# Patient Record
Sex: Male | Born: 1960 | Race: Black or African American | Hispanic: No | Marital: Single | Smoking: Current every day smoker
Health system: Southern US, Community
[De-identification: ages and names within clinical notes are randomized; demographics above are authoritative.]

## PROBLEM LIST (undated history)

## (undated) DIAGNOSIS — I10 Essential (primary) hypertension: Secondary | ICD-10-CM

## (undated) DIAGNOSIS — I255 Ischemic cardiomyopathy: Secondary | ICD-10-CM

## (undated) DIAGNOSIS — I2510 Atherosclerotic heart disease of native coronary artery without angina pectoris: Secondary | ICD-10-CM

## (undated) HISTORY — PX: TOTAL HIP ARTHROPLASTY: SHX124

---

## 2003-02-28 ENCOUNTER — Emergency Department (HOSPITAL_COMMUNITY): Admission: EM | Admit: 2003-02-28 | Discharge: 2003-02-28 | Payer: Self-pay | Admitting: *Deleted

## 2004-10-27 ENCOUNTER — Emergency Department (HOSPITAL_COMMUNITY): Admission: EM | Admit: 2004-10-27 | Discharge: 2004-10-27 | Payer: Self-pay | Admitting: Emergency Medicine

## 2005-01-14 ENCOUNTER — Emergency Department (HOSPITAL_COMMUNITY): Admission: EM | Admit: 2005-01-14 | Discharge: 2005-01-14 | Payer: Self-pay | Admitting: *Deleted

## 2005-12-10 ENCOUNTER — Emergency Department (HOSPITAL_COMMUNITY): Admission: EM | Admit: 2005-12-10 | Discharge: 2005-12-10 | Payer: Self-pay | Admitting: Emergency Medicine

## 2006-01-09 ENCOUNTER — Emergency Department (HOSPITAL_COMMUNITY): Admission: EM | Admit: 2006-01-09 | Discharge: 2006-01-10 | Payer: Self-pay | Admitting: Emergency Medicine

## 2006-02-09 ENCOUNTER — Emergency Department (HOSPITAL_COMMUNITY): Admission: EM | Admit: 2006-02-09 | Discharge: 2006-02-09 | Payer: Self-pay | Admitting: Emergency Medicine

## 2006-02-10 ENCOUNTER — Ambulatory Visit: Payer: Self-pay | Admitting: Family Medicine

## 2006-03-03 ENCOUNTER — Ambulatory Visit: Payer: Self-pay | Admitting: Family Medicine

## 2006-03-10 ENCOUNTER — Ambulatory Visit: Payer: Self-pay | Admitting: Family Medicine

## 2006-03-13 ENCOUNTER — Ambulatory Visit: Payer: Self-pay | Admitting: Family Medicine

## 2008-12-25 ENCOUNTER — Encounter (HOSPITAL_COMMUNITY): Admission: RE | Admit: 2008-12-25 | Discharge: 2009-01-24 | Payer: Self-pay | Admitting: Orthopedic Surgery

## 2009-01-26 ENCOUNTER — Encounter (HOSPITAL_COMMUNITY): Admission: RE | Admit: 2009-01-26 | Discharge: 2009-02-22 | Payer: Self-pay | Admitting: Orthopedic Surgery

## 2010-12-08 ENCOUNTER — Encounter: Payer: Self-pay | Admitting: Orthopaedic Surgery

## 2011-09-02 ENCOUNTER — Emergency Department (HOSPITAL_COMMUNITY)
Admission: EM | Admit: 2011-09-02 | Discharge: 2011-09-02 | Disposition: A | Discharge status: Home / Self Care | Payer: Medicare Other | Attending: Emergency Medicine | Admitting: Emergency Medicine

## 2011-09-02 ENCOUNTER — Encounter: Payer: Self-pay | Admitting: *Deleted

## 2011-09-02 DIAGNOSIS — Z96649 Presence of unspecified artificial hip joint: Secondary | ICD-10-CM | POA: Insufficient documentation

## 2011-09-02 DIAGNOSIS — R51 Headache: Secondary | ICD-10-CM | POA: Insufficient documentation

## 2011-09-02 DIAGNOSIS — F17200 Nicotine dependence, unspecified, uncomplicated: Secondary | ICD-10-CM | POA: Insufficient documentation

## 2011-09-02 DIAGNOSIS — M62838 Other muscle spasm: Secondary | ICD-10-CM | POA: Insufficient documentation

## 2011-09-02 DIAGNOSIS — M436 Torticollis: Secondary | ICD-10-CM | POA: Insufficient documentation

## 2011-09-02 MED ORDER — ONDANSETRON HCL 4 MG PO TABS
4.0000 mg | ORAL_TABLET | Freq: Once | ORAL | Status: AC
  Administered 2011-09-02: 4 mg via ORAL
  Filled 2011-09-02: qty 1

## 2011-09-02 MED ORDER — DIAZEPAM 5 MG PO TABS
5.0000 mg | ORAL_TABLET | Freq: Once | ORAL | Status: AC
  Administered 2011-09-02: 5 mg via ORAL
  Filled 2011-09-02: qty 1

## 2011-09-02 MED ORDER — MORPHINE SULFATE 10 MG/ML IJ SOLN
8.0000 mg | Freq: Once | INTRAMUSCULAR | Status: AC
  Administered 2011-09-02: 10 mg via INTRAVENOUS
  Filled 2011-09-02: qty 1

## 2011-09-02 MED ORDER — HYDROCODONE-ACETAMINOPHEN 7.5-325 MG PO TABS: 1.0000 | ORAL_TABLET | ORAL | Status: AC | PRN

## 2011-09-02 MED ORDER — METHOCARBAMOL 500 MG PO TABS: 500.0000 mg | ORAL_TABLET | Freq: Two times a day (BID) | ORAL | Status: AC

## 2011-09-02 NOTE — ED Notes (Signed)
Pt states he "probally slept wrong  Sunday night," when he woke Monday morning neck was sore and pain has progressed over past 2 days. Rt side of neck is tight with lymph palpable just under ear.  Pt states jolts of pain are felt when he walks and swallows.

## 2011-09-02 NOTE — ED Notes (Signed)
Pt c/o pain in his neck since waking up yesterday morning. Pt denies injury.

## 2011-09-02 NOTE — ED Provider Notes (Signed)
History     CSN: [id number] Arrival date & time: 09/02/2011 12:36 PM   First MD Initiated Contact with Patient 09/02/11 1403      Chief Complaint  Patient presents with  . Neck Pain    (Consider location/radiation/quality/duration/timing/severity/associated sxs/prior treatment) Patient is a 51 y.o. male presenting with neck pain. The history is provided by the patient.  Neck Pain  The current episode started yesterday. The problem occurs constantly. The problem has been gradually worsening. The pain is associated with an unknown factor. There has been no fever. The pain is present in the right side. The pain is severe. The symptoms are aggravated by position. The pain is the same all the time. Associated symptoms include headaches. Pertinent negatives include no photophobia, no chest pain, no paresis and no tingling. He has tried analgesics for the symptoms.    History reviewed. No pertinent past medical history.  Past Surgical History  Procedure Date  . Total hip arthroplasty     bilateral    History reviewed. No pertinent family history.  History  Substance Use Topics  . Smoking status: Current Everyday Smoker -- 1.0 packs/day  . Smokeless tobacco: Not on file  . Alcohol Use: Yes     weekly      Review of Systems  Constitutional: Negative for activity change.       All ROS Neg except as noted in HPI  HENT: Positive for neck pain. Negative for nosebleeds.   Eyes: Negative for photophobia and discharge.  Respiratory: Negative for cough, shortness of breath and wheezing.   Cardiovascular: Negative for chest pain and palpitations.  Gastrointestinal: Negative for abdominal pain and blood in stool.  Genitourinary: Negative for dysuria, frequency and hematuria.  Musculoskeletal: Negative for back pain and arthralgias.  Skin: Negative.   Neurological: Positive for headaches. Negative for dizziness, tingling, seizures and speech difficulty.  Psychiatric/Behavioral:  Negative for hallucinations and confusion.    Allergies  Ibuprofen  Home Medications  No current outpatient prescriptions on file.  BP 134/82  Pulse 80  Temp(Src) 98.5 F (36.9 C) (Oral)  Resp 18  Ht 5\' 9"  (1.753 m)  Wt 154 lb (69.854 kg)  BMI 22.74 kg/m2  SpO2 97%  Physical Exam  Nursing note and vitals reviewed. Constitutional: He is oriented to person, place, and time. He appears well-developed and well-nourished.  Non-toxic appearance.  HENT:  Head: Normocephalic.  Right Ear: Tympanic membrane and external ear normal.  Left Ear: Tympanic membrane and external ear normal.  Eyes: EOM and lids are normal. Pupils are equal, round, and reactive to light.  Neck: Carotid bruit is not present.       Pain and muscle spasm of the right neck. No bruit. No CLA.  Cardiovascular: Normal rate, regular rhythm, normal heart sounds, intact distal pulses and normal pulses.   Pulmonary/Chest: Breath sounds normal. No respiratory distress.  Abdominal: Soft. Bowel sounds are normal. There is no tenderness. There is no guarding.  Musculoskeletal: Normal range of motion.  Lymphadenopathy:       Head (right side): No submandibular adenopathy present.       Head (left side): No submandibular adenopathy present.    He has no cervical adenopathy.  Neurological: He is alert and oriented to person, place, and time. He has normal strength. No cranial nerve deficit or sensory deficit.  Skin: Skin is warm and dry.  Psychiatric: He has a normal mood and affect. His speech is normal.    ED Course  Procedures (  including critical care time)  Labs Reviewed - No data to display No results found.  Dx: [place]   MDM  I have reviewed nursing notes, vital signs, and all appropriate lab and imaging results for this patient.        Kathie Dike, [place] 09/09/11 [phone]

## 2011-09-11 NOTE — ED Provider Notes (Signed)
Medical screening examination/treatment/procedure(s) were performed by non-physician practitioner and as supervising physician I was immediately available for consultation/collaboration.   Saraiah Bhat M Terrell Shimko, DO 09/11/11 1713 

## 2012-12-29 ENCOUNTER — Encounter (HOSPITAL_COMMUNITY): Payer: Self-pay | Admitting: *Deleted

## 2012-12-29 ENCOUNTER — Emergency Department (HOSPITAL_COMMUNITY)
Admission: EM | Admit: 2012-12-29 | Discharge: 2012-12-29 | Disposition: A | Discharge status: Home / Self Care | Payer: Medicare Other | Attending: Emergency Medicine | Admitting: Emergency Medicine

## 2012-12-29 DIAGNOSIS — T7840XA Allergy, unspecified, initial encounter: Secondary | ICD-10-CM

## 2012-12-29 DIAGNOSIS — T4995XA Adverse effect of unspecified topical agent, initial encounter: Secondary | ICD-10-CM | POA: Insufficient documentation

## 2012-12-29 DIAGNOSIS — L299 Pruritus, unspecified: Secondary | ICD-10-CM | POA: Insufficient documentation

## 2012-12-29 DIAGNOSIS — F17200 Nicotine dependence, unspecified, uncomplicated: Secondary | ICD-10-CM | POA: Insufficient documentation

## 2012-12-29 MED ORDER — DIPHENHYDRAMINE HCL 25 MG PO CAPS
50.0000 mg | ORAL_CAPSULE | Freq: Once | ORAL | Status: AC
  Administered 2012-12-29: 50 mg via ORAL
  Filled 2012-12-29: qty 2

## 2012-12-29 MED ORDER — PREDNISONE 50 MG PO TABS
60.0000 mg | ORAL_TABLET | Freq: Once | ORAL | Status: AC
  Administered 2012-12-29: 60 mg via ORAL
  Filled 2012-12-29: qty 1

## 2012-12-29 MED ORDER — PREDNISONE 10 MG PO TABS: 20.0000 mg | ORAL_TABLET | Freq: Every day | ORAL | Status: DC

## 2012-12-29 MED ORDER — FAMOTIDINE 20 MG PO TABS
20.0000 mg | ORAL_TABLET | Freq: Once | ORAL | Status: AC
  Administered 2012-12-29: 20 mg via ORAL
  Filled 2012-12-29: qty 1

## 2012-12-29 NOTE — ED Provider Notes (Signed)
History     CSN: [id number]  Arrival date & time 12/29/12  5621   First MD Initiated Contact with Patient 12/29/12 [phone]      Chief Complaint  Patient presents with  . Allergic Reaction    face feels tight. no new meds    (Consider location/radiation/quality/duration/timing/severity/associated sxs/prior treatment) HPI Tyler James is a 52 y.o. male who presents to the Emergency Department complaining of waking up with itching and a sense of fullness to his face. He noticed when looking in the mirror that he has a rash on his face. 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 He denies shortness of breath, fever, chills, cough.  History reviewed. No pertinent past medical history.  Past Surgical History  Procedure Laterality Date  . Total hip arthroplasty      bilateral    History reviewed. No pertinent family history.  History  Substance Use Topics  . Smoking status: Current Every Day Smoker -- 1.00 packs/day  . Smokeless tobacco: Not on file  . Alcohol Use: Yes     Comment: weekly      Review of Systems  Constitutional: Negative for fever.       10 Systems reviewed and are negative for acute change except as noted in the HPI.  HENT: Negative for congestion.   Eyes: Negative for discharge and redness.  Respiratory: Negative for cough and shortness of breath.   Cardiovascular: Negative for chest pain.  Gastrointestinal: Negative for vomiting and abdominal pain.  Musculoskeletal: Negative for back pain.  Skin: Negative for rash.       Fine rash to face, neck, hands  Neurological: Negative for syncope, numbness and headaches.  Psychiatric/Behavioral:       No behavior change.    Allergies  Ibuprofen  Home Medications   Current Outpatient Rx  Name  Route  Sig  Dispense  Refill  . acetaminophen (TYLENOL) 500 MG tablet   Oral   Take 1,000 mg by mouth as needed. For pain            BP 128/79  Pulse 83  Temp(Src) 97.9 F (36.6 C)  (Oral)  Ht 6\' 1"  (1.854 m)  Wt 160 lb (72.576 kg)  BMI 21.11 kg/m2  SpO2 95%  Physical Exam  Nursing note and vitals reviewed. Constitutional:  Awake, alert, nontoxic appearance.  HENT:  Head: Atraumatic.  Eyes: Right eye exhibits no discharge. Left eye exhibits no discharge.  Neck: Neck supple.  Cardiovascular: Normal rate.   Pulmonary/Chest: Effort normal and breath sounds normal. He exhibits no tenderness.  Abdominal: Soft. There is no tenderness. There is no rebound.  Musculoskeletal: He exhibits no tenderness.  Baseline ROM, no obvious new focal weakness.  Neurological:  Mental status and motor strength appears baseline for patient and situation.  Skin: No rash noted.  Fine rash to face, hands, feet, groin, axilla  Psychiatric: He has a normal mood and affect.    ED Course  Procedures (including critical care time)    MDM  Patient with a non specific rash that itches. Given benadryl, prednisone, pepcid. Patient feels better. Pt stable in ED with no significant deterioration in condition.The patient appears reasonably screened and/or stabilized for discharge and I doubt any other medical condition or other [facility] requiring further screening, evaluation, or treatment in the ED at this time prior to discharge. MDM Reviewed: nursing note and vitals           Nicoletta Dress.  Colon Branch, MD 12/29/12 [phone]

## 2013-09-29 DIAGNOSIS — M1990 Unspecified osteoarthritis, unspecified site: Secondary | ICD-10-CM | POA: Diagnosis not present

## 2013-10-03 ENCOUNTER — Other Ambulatory Visit (HOSPITAL_COMMUNITY): Payer: Self-pay | Admitting: Internal Medicine

## 2013-10-03 ENCOUNTER — Ambulatory Visit (HOSPITAL_COMMUNITY)
Admission: RE | Admit: 2013-10-03 | Discharge: 2013-10-03 | Disposition: A | Discharge status: Home / Self Care | Payer: Medicare Other | Source: Ambulatory Visit | Attending: Internal Medicine | Admitting: Internal Medicine

## 2013-10-03 DIAGNOSIS — M25529 Pain in unspecified elbow: Secondary | ICD-10-CM | POA: Insufficient documentation

## 2013-10-03 DIAGNOSIS — M25429 Effusion, unspecified elbow: Secondary | ICD-10-CM | POA: Insufficient documentation

## 2013-10-03 DIAGNOSIS — R609 Edema, unspecified: Secondary | ICD-10-CM

## 2013-10-03 DIAGNOSIS — M7989 Other specified soft tissue disorders: Secondary | ICD-10-CM | POA: Diagnosis not present

## 2013-10-06 DIAGNOSIS — F17200 Nicotine dependence, unspecified, uncomplicated: Secondary | ICD-10-CM | POA: Diagnosis not present

## 2013-10-06 DIAGNOSIS — E7800 Pure hypercholesterolemia, unspecified: Secondary | ICD-10-CM | POA: Diagnosis not present

## 2013-10-06 DIAGNOSIS — I10 Essential (primary) hypertension: Secondary | ICD-10-CM | POA: Diagnosis not present

## 2013-10-19 ENCOUNTER — Emergency Department (HOSPITAL_COMMUNITY)
Admission: EM | Admit: 2013-10-19 | Discharge: 2013-10-19 | Disposition: A | Discharge status: Home / Self Care | Payer: Medicare Other | Attending: Emergency Medicine | Admitting: Emergency Medicine

## 2013-10-19 ENCOUNTER — Encounter (HOSPITAL_COMMUNITY): Payer: Self-pay | Admitting: Emergency Medicine

## 2013-10-19 DIAGNOSIS — K089 Disorder of teeth and supporting structures, unspecified: Secondary | ICD-10-CM | POA: Diagnosis not present

## 2013-10-19 DIAGNOSIS — F17200 Nicotine dependence, unspecified, uncomplicated: Secondary | ICD-10-CM | POA: Diagnosis not present

## 2013-10-19 DIAGNOSIS — IMO0002 Reserved for concepts with insufficient information to code with codable children: Secondary | ICD-10-CM | POA: Diagnosis not present

## 2013-10-19 DIAGNOSIS — K0889 Other specified disorders of teeth and supporting structures: Secondary | ICD-10-CM

## 2013-10-19 MED ORDER — PENICILLIN V POTASSIUM 500 MG PO TABS: 500.0000 mg | ORAL_TABLET | Freq: Four times a day (QID) | ORAL | Status: DC

## 2013-10-19 NOTE — ED Notes (Signed)
Pt c/o R lower jaw dental pain. 

## 2013-10-19 NOTE — ED Provider Notes (Signed)
CSN: [id number]     Arrival date & time 10/19/13  1441 History   First MD Initiated Contact with Patient 10/19/13 1446     Chief Complaint  Patient presents with  . Dental Pain   (Consider location/radiation/quality/duration/timing/severity/associated sxs/prior Treatment) HPI Comments: Patient presents to the emergency department with a dental complaint. Symptoms began 3-4 days ago. The patient has tried to alleviate pain with BC powder and tylenol.  Pain rated at a 10/10, characterized as throbbing in nature and located right, rear, lower molar. Patient denies fever, night sweats, chills, difficulty swallowing or opening mouth, SOB, nuchal rigidity or decreased ROM of neck.  Patient does have a dentist appointment scheduled for in 2 weeks.   The history is provided by the patient. No language interpreter was used.    History reviewed. No pertinent past medical history. Past Surgical History  Procedure Laterality Date  . Total hip arthroplasty      bilateral   No family history on file. History  Substance Use Topics  . Smoking status: Current Every Day Smoker -- 1.00 packs/day  . Smokeless tobacco: Not on file  . Alcohol Use: Yes     Comment: weekly    Review of Systems  All other systems reviewed and are negative.    Allergies  Ibuprofen  Home Medications   Current Outpatient Rx  Name  Route  Sig  Dispense  Refill  . acetaminophen (TYLENOL) 500 MG tablet   Oral   Take 1,000 mg by mouth as needed. For pain          . penicillin v potassium (VEETID) 500 MG tablet   Oral   Take 1 tablet (500 mg total) by mouth 4 (four) times daily.   40 tablet   0   . predniSONE (DELTASONE) 10 MG tablet   Oral   Take 2 tablets (20 mg total) by mouth daily.   10 tablet   0    BP 147/98  Pulse 83  Temp(Src) 98.4 F (36.9 C) (Oral)  Resp 16  Ht 5\' 9"  (1.753 m)  Wt 165 lb (74.844 kg)  BMI 24.36 kg/m2  SpO2 100% Physical Exam  Nursing note and vitals  reviewed. Constitutional: He is oriented to person, place, and time. He appears well-developed and well-nourished.  HENT:  Head: Normocephalic and atraumatic.  Mouth/Throat:    Poor dentition throughout.  Affected tooth as diagrammed.  No signs of peritonsillar or tonsillar abscess.  No signs of gingival abscess. Oropharynx is clear and without exudates.  Uvula is midline.  Airway is intact. No signs of Ludwig's angina with palpation of oral and sublingual mucosa.   Eyes: Conjunctivae and EOM are normal.  Neck: Normal range of motion.  Cardiovascular: Normal rate.   Pulmonary/Chest: Effort normal.  Abdominal: He exhibits no distension.  Musculoskeletal: Normal range of motion.  Neurological: He is alert and oriented to person, place, and time.  Skin: Skin is dry.  Psychiatric: He has a normal mood and affect. His behavior is normal. Judgment and thought content normal.    ED Course  Procedures (including critical care time) Labs Review Labs Reviewed - No data to display Imaging Review No results found.  EKG Interpretation   None       MDM   1. Pain, dental    Patient with toothache.  No gross abscess.  Exam unconcerning for Ludwig's angina or spread of infection.  Will treat with penicillin, continue OTC tylenol, ice, and heat.  Urged patient  to follow-up with dentist.       Roxy Horseman, PA-C 10/19/13 1459

## 2013-10-20 ENCOUNTER — Encounter (HOSPITAL_COMMUNITY): Payer: Self-pay | Admitting: Emergency Medicine

## 2013-10-20 ENCOUNTER — Emergency Department (HOSPITAL_COMMUNITY)
Admission: EM | Admit: 2013-10-20 | Discharge: 2013-10-20 | Disposition: A | Discharge status: Home / Self Care | Payer: Medicare Other | Attending: Emergency Medicine | Admitting: Emergency Medicine

## 2013-10-20 DIAGNOSIS — K08109 Complete loss of teeth, unspecified cause, unspecified class: Secondary | ICD-10-CM | POA: Insufficient documentation

## 2013-10-20 DIAGNOSIS — K029 Dental caries, unspecified: Secondary | ICD-10-CM | POA: Diagnosis not present

## 2013-10-20 DIAGNOSIS — K089 Disorder of teeth and supporting structures, unspecified: Secondary | ICD-10-CM | POA: Insufficient documentation

## 2013-10-20 DIAGNOSIS — IMO0002 Reserved for concepts with insufficient information to code with codable children: Secondary | ICD-10-CM | POA: Insufficient documentation

## 2013-10-20 DIAGNOSIS — F17200 Nicotine dependence, unspecified, uncomplicated: Secondary | ICD-10-CM | POA: Insufficient documentation

## 2013-10-20 DIAGNOSIS — K0889 Other specified disorders of teeth and supporting structures: Secondary | ICD-10-CM

## 2013-10-20 MED ORDER — TRAMADOL HCL 50 MG PO TABS
100.0000 mg | ORAL_TABLET | Freq: Once | ORAL | Status: AC
  Administered 2013-10-20: 100 mg via ORAL
  Filled 2013-10-20: qty 2

## 2013-10-20 MED ORDER — TRAMADOL HCL 50 MG PO TABS: 100.0000 mg | ORAL_TABLET | Freq: Four times a day (QID) | ORAL | Status: DC | PRN

## 2013-10-20 MED ORDER — ACETAMINOPHEN 500 MG PO TABS
1000.0000 mg | ORAL_TABLET | Freq: Once | ORAL | Status: AC
  Administered 2013-10-20: 1000 mg via ORAL
  Filled 2013-10-20: qty 2

## 2013-10-20 MED ORDER — CLINDAMYCIN HCL 300 MG PO CAPS: 300.0000 mg | ORAL_CAPSULE | Freq: Four times a day (QID) | ORAL | Status: DC

## 2013-10-20 NOTE — ED Notes (Signed)
Patient complaining of right lower toothache. Reports has appointment with dentist on the 15th.

## 2013-10-20 NOTE — ED Provider Notes (Signed)
CSN: [id number]     Arrival date & time 10/20/13  0054 History   First MD Initiated Contact with Patient 10/20/13 0109     Chief Complaint  Patient presents with  . Dental Pain   (Consider location/radiation/quality/duration/timing/severity/associated sxs/prior Treatment) HPI Patient reports he has had dental problems off and on for a long time. He has had all of his upper teeth extracted. He has had most of his molars removed on the lower jaw bilaterally. He reports about 3 days ago he started having pain in his right lower jaw. He reports he has an appointment to see a dentist for the first time on December 15 in [place] across from [facility]. He was seen in the ED less than 12 hours ago and was prescribed penicillin which he's been taking. He reports he is not improving and he still has pain. He denies any fever. He states his tongue touching his teeth, swallowing, or cold makes a tooth hurt more. He does report however he was advised to put ice on his face and that does help with the pain. He reports he needs something else for pain.   PCP Dr Felecia Shelling   History reviewed. No pertinent past medical history. Past Surgical History  Procedure Laterality Date  . Total hip arthroplasty      bilateral   History reviewed. No pertinent family history. History  Substance Use Topics  . Smoking status: Current Every Day Smoker -- 1.00 packs/day  . Smokeless tobacco: Never Used  . Alcohol Use: Yes     Comment: weekly  on disability b/o bilaterally hip replacements  Review of Systems  All other systems reviewed and are negative.    Allergies  Ibuprofen  Home Medications   Current Outpatient Rx  Name  Route  Sig  Dispense  Refill  . acetaminophen (TYLENOL) 500 MG tablet   Oral   Take 1,000 mg by mouth as needed. For pain          . penicillin v potassium (VEETID) 500 MG tablet   Oral   Take 1 tablet (500 mg total) by mouth 4 (four) times daily.   40 tablet   0   .  predniSONE (DELTASONE) 10 MG tablet   Oral   Take 2 tablets (20 mg total) by mouth daily.   10 tablet   0    BP 168/102  Pulse 78  Temp(Src) 98.6 F (37 C) (Oral)  Resp 16  Ht 5\' 9"  (1.753 m)  Wt 165 lb (74.844 kg)  BMI 24.36 kg/m2  SpO2 99%  Vital signs normal   Physical Exam  Nursing note and vitals reviewed. Constitutional: He is oriented to person, place, and time. He appears well-developed and well-nourished.  Non-toxic appearance. He does not appear ill. No distress.  HENT:  Head: Normocephalic and atraumatic.  Right Ear: External ear normal.  Left Ear: External ear normal.  Nose: Nose normal. No mucosal edema or rhinorrhea.  Mouth/Throat: Oropharynx is clear and moist and mucous membranes are normal. No dental abscesses or uvula swelling.    No swelling of gums, no external swelling of the face or neck.   Eyes: Conjunctivae and EOM are normal. Pupils are equal, round, and reactive to light.  Neck: Normal range of motion and full passive range of motion without pain. Neck supple.  Pulmonary/Chest: Effort normal and breath sounds normal. No respiratory distress. He has no rhonchi. He exhibits no crepitus.  Abdominal: Normal appearance.  Musculoskeletal: Normal range  of motion. He exhibits no edema and no tenderness.  Moves all extremities well.   Neurological: He is alert and oriented to person, place, and time. He has normal strength. No cranial nerve deficit.  Skin: Skin is warm, dry and intact. No rash noted. No erythema. No pallor.  Psychiatric: He has a normal mood and affect. His speech is normal and behavior is normal. His mood appears not anxious.    ED Course  Procedures (including critical care time)  Medications  traMADol (ULTRAM) tablet 100 mg (not administered)  acetaminophen (TYLENOL) tablet 1,000 mg (not administered)    Discussed with patient he hasn't even taken the antibiotic for 24 hrs. Will give prescription for another antibiotic, but he  should take the Penicillin for at least 48 hrs before he changes antibiotics.   Labs Review Labs Reviewed - No data to display Imaging Review No results found.    MDM   1. Toothache      . Discharge Medication List as of 10/20/2013  1:54 AM    START taking these medications   Details  clindamycin (CLEOCIN) 300 MG capsule Take 1 capsule (300 mg total) by mouth every 6 (six) hours., Starting 10/20/2013, Until Discontinued, Print    traMADol (ULTRAM) 50 MG tablet Take 2 tablets (100 mg total) by mouth every 6 (six) hours as needed., Starting 10/20/2013, Until Discontinued, Print        Plan discharge    Ward Givens, MD 10/20/13 [phone]

## 2013-10-21 NOTE — ED Provider Notes (Signed)
Medical screening examination/treatment/procedure(s) were performed by non-physician practitioner and as supervising physician I was immediately available for consultation/collaboration.  EKG Interpretation   None         Jan Olano W. Zachrey Deutscher, MD 10/21/13 1639 

## 2013-10-28 ENCOUNTER — Telehealth: Payer: Self-pay

## 2013-10-28 NOTE — Telephone Encounter (Signed)
Pt was referred by Dr. Felecia Shelling for screening colonoscopy. LM for a return call.

## 2013-11-15 NOTE — Telephone Encounter (Signed)
Letter to PCP

## 2014-01-16 DIAGNOSIS — M1990 Unspecified osteoarthritis, unspecified site: Secondary | ICD-10-CM | POA: Diagnosis not present

## 2016-03-10 ENCOUNTER — Inpatient Hospital Stay (HOSPITAL_COMMUNITY)
Admission: EM | Admit: 2016-03-10 | Discharge: 2016-03-13 | DRG: 247 | Disposition: A | Discharge status: Home / Self Care | Payer: Medicare Other | Attending: Cardiology | Admitting: Cardiology

## 2016-03-10 ENCOUNTER — Encounter (HOSPITAL_COMMUNITY): Payer: Self-pay | Admitting: Emergency Medicine

## 2016-03-10 ENCOUNTER — Encounter (HOSPITAL_COMMUNITY)
Admission: EM | Disposition: A | Discharge status: Home / Self Care | Payer: Self-pay | Source: Home / Self Care | Attending: Cardiology

## 2016-03-10 ENCOUNTER — Ambulatory Visit (HOSPITAL_COMMUNITY): Admit: 2016-03-10 | Payer: Self-pay | Admitting: Cardiology

## 2016-03-10 DIAGNOSIS — I200 Unstable angina: Secondary | ICD-10-CM | POA: Insufficient documentation

## 2016-03-10 DIAGNOSIS — R079 Chest pain, unspecified: Secondary | ICD-10-CM | POA: Diagnosis not present

## 2016-03-10 DIAGNOSIS — Z886 Allergy status to analgesic agent status: Secondary | ICD-10-CM

## 2016-03-10 DIAGNOSIS — I369 Nonrheumatic tricuspid valve disorder, unspecified: Secondary | ICD-10-CM | POA: Diagnosis present

## 2016-03-10 DIAGNOSIS — I2121 ST elevation (STEMI) myocardial infarction involving left circumflex coronary artery: Secondary | ICD-10-CM

## 2016-03-10 DIAGNOSIS — I213 ST elevation (STEMI) myocardial infarction of unspecified site: Secondary | ICD-10-CM

## 2016-03-10 DIAGNOSIS — Z720 Tobacco use: Secondary | ICD-10-CM | POA: Diagnosis present

## 2016-03-10 DIAGNOSIS — Z96643 Presence of artificial hip joint, bilateral: Secondary | ICD-10-CM | POA: Diagnosis not present

## 2016-03-10 DIAGNOSIS — F17210 Nicotine dependence, cigarettes, uncomplicated: Secondary | ICD-10-CM | POA: Diagnosis present

## 2016-03-10 DIAGNOSIS — I2119 ST elevation (STEMI) myocardial infarction involving other coronary artery of inferior wall: Secondary | ICD-10-CM | POA: Diagnosis not present

## 2016-03-10 DIAGNOSIS — I2510 Atherosclerotic heart disease of native coronary artery without angina pectoris: Secondary | ICD-10-CM | POA: Diagnosis not present

## 2016-03-10 DIAGNOSIS — I255 Ischemic cardiomyopathy: Secondary | ICD-10-CM | POA: Diagnosis not present

## 2016-03-10 DIAGNOSIS — I25110 Atherosclerotic heart disease of native coronary artery with unstable angina pectoris: Secondary | ICD-10-CM | POA: Diagnosis present

## 2016-03-10 DIAGNOSIS — Z955 Presence of coronary angioplasty implant and graft: Secondary | ICD-10-CM

## 2016-03-10 DIAGNOSIS — R110 Nausea: Secondary | ICD-10-CM | POA: Diagnosis not present

## 2016-03-10 HISTORY — DX: Ischemic cardiomyopathy: I25.5

## 2016-03-10 HISTORY — DX: Atherosclerotic heart disease of native coronary artery without angina pectoris: I25.10

## 2016-03-10 HISTORY — PX: CARDIAC CATHETERIZATION: SHX172

## 2016-03-10 LAB — CREATININE, SERUM: Creatinine, Ser: 0.88 mg/dL (ref 0.61–1.24)

## 2016-03-10 LAB — CBC
HCT: 43.1 % (ref 39.0–52.0)
HCT: 39.3 % (ref 39.0–52.0)
Hemoglobin: 14.3 g/dL (ref 13.0–17.0)
Hemoglobin: 12.9 g/dL — ABNORMAL LOW (ref 13.0–17.0)
MCH: 24.8 pg — ABNORMAL LOW (ref 26.0–34.0)
MCH: 24.8 pg — ABNORMAL LOW (ref 26.0–34.0)
MCHC: 33.2 g/dL (ref 30.0–36.0)
MCHC: 32.8 g/dL (ref 30.0–36.0)
MCV: 74.8 fL — ABNORMAL LOW (ref 78.0–100.0)
MCV: 75.4 fL — ABNORMAL LOW (ref 78.0–100.0)
PLATELETS: 194 10*3/uL (ref 150–400)
PLATELETS: 167 10*3/uL (ref 150–400)
RBC: 5.76 MIL/uL (ref 4.22–5.81)
RBC: 5.21 MIL/uL (ref 4.22–5.81)
RDW: 14.8 % (ref 11.5–15.5)
RDW: 14.6 % (ref 11.5–15.5)
WBC: 7.7 10*3/uL (ref 4.0–10.5)
WBC: 6.6 10*3/uL (ref 4.0–10.5)

## 2016-03-10 LAB — APTT: aPTT: 28 seconds (ref 24–37)

## 2016-03-10 LAB — COMPREHENSIVE METABOLIC PANEL
ALBUMIN: 4.3 g/dL (ref 3.5–5.0)
ALK PHOS: 62 U/L (ref 38–126)
ALT: 22 U/L (ref 17–63)
AST: 27 U/L (ref 15–41)
Anion gap: 10 (ref 5–15)
BUN: 13 mg/dL (ref 6–20)
CO2: 25 mmol/L (ref 22–32)
CREATININE: 0.88 mg/dL (ref 0.61–1.24)
Calcium: 9.1 mg/dL (ref 8.9–10.3)
Chloride: 107 mmol/L (ref 101–111)
Glucose, Bld: 109 mg/dL — ABNORMAL HIGH (ref 65–99)
Potassium: 3.3 mmol/L — ABNORMAL LOW (ref 3.5–5.1)
Sodium: 142 mmol/L (ref 135–145)
Total Bilirubin: 0.5 mg/dL (ref 0.3–1.2)
Total Protein: 7.5 g/dL (ref 6.5–8.1)

## 2016-03-10 LAB — I-STAT CHEM 8, ED
BUN: 13 mg/dL (ref 6–20)
CREATININE: 0.9 mg/dL (ref 0.61–1.24)
Calcium, Ion: 1.11 mmol/L — ABNORMAL LOW (ref 1.12–1.23)
Chloride: 104 mmol/L (ref 101–111)
Glucose, Bld: 102 mg/dL — ABNORMAL HIGH (ref 65–99)
HEMATOCRIT: 46 % (ref 39.0–52.0)
Hemoglobin: 15.6 g/dL (ref 13.0–17.0)
POTASSIUM: 3.3 mmol/L — AB (ref 3.5–5.1)
Sodium: 144 mmol/L (ref 135–145)
TCO2: 23 mmol/L (ref 0–100)

## 2016-03-10 LAB — PROTIME-INR
INR: 1.06 (ref 0.00–1.49)
Prothrombin Time: 14 seconds (ref 11.6–15.2)

## 2016-03-10 LAB — MRSA PCR SCREENING: MRSA BY PCR: NEGATIVE

## 2016-03-10 LAB — TROPONIN I: Troponin I: 4.67 ng/mL (ref <0.031)

## 2016-03-10 LAB — I-STAT TROPONIN, ED: Troponin i, poc: 0.03 ng/mL (ref 0.00–0.08)

## 2016-03-10 SURGERY — LEFT HEART CATH AND CORONARY ANGIOGRAPHY
Anesthesia: LOCAL

## 2016-03-10 MED ORDER — LIDOCAINE HCL (PF) 1 % IJ SOLN
INTRAMUSCULAR | Status: DC | PRN
  Administered 2016-03-10: 5 mL

## 2016-03-10 MED ORDER — HEPARIN (PORCINE) IN NACL 2-0.9 UNIT/ML-% IJ SOLN
INTRAMUSCULAR | Status: AC
  Filled 2016-03-10: qty 500

## 2016-03-10 MED ORDER — SODIUM CHLORIDE 0.9 % IV BOLUS (SEPSIS): 1000.0000 mL | Freq: Once | INTRAVENOUS | Status: DC

## 2016-03-10 MED ORDER — SODIUM CHLORIDE 0.9 % IV SOLN
INTRAVENOUS | Status: DC
  Administered 2016-03-10: 1 mL via INTRAVENOUS
  Administered 2016-03-10: 20 mL/h via INTRAVENOUS

## 2016-03-10 MED ORDER — TICAGRELOR 90 MG PO TABS
90.0000 mg | ORAL_TABLET | Freq: Two times a day (BID) | ORAL | Status: DC
  Administered 2016-03-11 – 2016-03-13 (×5): 90 mg via ORAL
  Filled 2016-03-10 (×5): qty 1

## 2016-03-10 MED ORDER — SODIUM CHLORIDE 0.9 % IV SOLN: 250.0000 mL | INTRAVENOUS | Status: DC | PRN

## 2016-03-10 MED ORDER — SODIUM CHLORIDE 0.9% FLUSH
3.0000 mL | Freq: Two times a day (BID) | INTRAVENOUS | Status: DC
  Administered 2016-03-10 – 2016-03-12 (×4): 3 mL via INTRAVENOUS

## 2016-03-10 MED ORDER — ONDANSETRON HCL 4 MG/2ML IJ SOLN
INTRAMUSCULAR | Status: AC
  Filled 2016-03-10: qty 2

## 2016-03-10 MED ORDER — SODIUM CHLORIDE 0.9% FLUSH: 3.0000 mL | INTRAVENOUS | Status: DC | PRN

## 2016-03-10 MED ORDER — ATROPINE SULFATE 0.1 MG/ML IJ SOLN
INTRAMUSCULAR | Status: DC | PRN
Start: 2016-03-10 — End: 2016-03-10
  Administered 2016-03-10: 0.5 mg via INTRAVENOUS

## 2016-03-10 MED ORDER — ACETAMINOPHEN 325 MG PO TABS: 650.0000 mg | ORAL_TABLET | ORAL | Status: DC | PRN

## 2016-03-10 MED ORDER — ATROPINE SULFATE 0.1 MG/ML IJ SOLN
INTRAMUSCULAR | Status: AC
  Filled 2016-03-10: qty 10

## 2016-03-10 MED ORDER — SODIUM CHLORIDE 0.9 % IV SOLN
INTRAVENOUS | Status: DC | PRN
  Administered 2016-03-10: 75 mL/h via INTRAVENOUS
  Administered 2016-03-10: 1 mL

## 2016-03-10 MED ORDER — VERAPAMIL HCL 2.5 MG/ML IV SOLN
INTRAVENOUS | Status: DC | PRN
  Administered 2016-03-10: 20:00:00 via INTRA_ARTERIAL

## 2016-03-10 MED ORDER — SODIUM CHLORIDE 0.9 % IV SOLN
250.0000 mg | INTRAVENOUS | Status: DC | PRN
  Administered 2016-03-10: 1.75 mg/kg/h via INTRAVENOUS

## 2016-03-10 MED ORDER — LIDOCAINE HCL (PF) 1 % IJ SOLN
INTRAMUSCULAR | Status: AC
  Filled 2016-03-10: qty 30

## 2016-03-10 MED ORDER — TICAGRELOR 90 MG PO TABS
ORAL_TABLET | ORAL | Status: AC
  Filled 2016-03-10: qty 1

## 2016-03-10 MED ORDER — ASPIRIN 81 MG PO CHEW
81.0000 mg | CHEWABLE_TABLET | Freq: Every day | ORAL | Status: DC
  Administered 2016-03-11 – 2016-03-13 (×3): 81 mg via ORAL
  Filled 2016-03-10 (×3): qty 1

## 2016-03-10 MED ORDER — NITROGLYCERIN 0.4 MG SL SUBL
0.4000 mg | SUBLINGUAL_TABLET | SUBLINGUAL | Status: DC | PRN
  Administered 2016-03-10: 0.4 mg via SUBLINGUAL

## 2016-03-10 MED ORDER — SODIUM CHLORIDE 0.9% FLUSH
3.0000 mL | INTRAVENOUS | Status: DC | PRN
  Administered 2016-03-11: 3 mL via INTRAVENOUS
  Filled 2016-03-10: qty 3

## 2016-03-10 MED ORDER — ATORVASTATIN CALCIUM 80 MG PO TABS
80.0000 mg | ORAL_TABLET | Freq: Every day | ORAL | Status: DC
  Administered 2016-03-10 – 2016-03-12 (×2): 80 mg via ORAL
  Filled 2016-03-10 (×3): qty 1

## 2016-03-10 MED ORDER — BIVALIRUDIN 250 MG IV SOLR
INTRAVENOUS | Status: AC
  Filled 2016-03-10: qty 250

## 2016-03-10 MED ORDER — IOPAMIDOL (ISOVUE-370) INJECTION 76%
INTRAVENOUS | Status: AC
  Filled 2016-03-10: qty 125

## 2016-03-10 MED ORDER — ONDANSETRON HCL 4 MG/2ML IJ SOLN: 4.0000 mg | Freq: Four times a day (QID) | INTRAMUSCULAR | Status: DC | PRN

## 2016-03-10 MED ORDER — HEPARIN SODIUM (PORCINE) 5000 UNIT/ML IJ SOLN
5000.0000 [IU] | INTRAMUSCULAR | Status: AC
  Administered 2016-03-10: 5000 [IU] via INTRAVENOUS
  Filled 2016-03-10: qty 1

## 2016-03-10 MED ORDER — VERAPAMIL HCL 2.5 MG/ML IV SOLN
INTRAVENOUS | Status: AC
  Filled 2016-03-10: qty 2

## 2016-03-10 MED ORDER — TICAGRELOR 90 MG PO TABS
ORAL_TABLET | ORAL | Status: DC | PRN
  Administered 2016-03-10: 180 mg via ORAL

## 2016-03-10 MED ORDER — HEPARIN SODIUM (PORCINE) 5000 UNIT/ML IJ SOLN
5000.0000 [IU] | Freq: Three times a day (TID) | INTRAMUSCULAR | Status: DC
  Administered 2016-03-11 – 2016-03-12 (×4): 5000 [IU] via SUBCUTANEOUS
  Filled 2016-03-10 (×3): qty 1

## 2016-03-10 MED ORDER — ASPIRIN 81 MG PO CHEW
324.0000 mg | CHEWABLE_TABLET | Freq: Once | ORAL | Status: AC
  Administered 2016-03-10: 324 mg via ORAL
  Filled 2016-03-10: qty 4

## 2016-03-10 MED ORDER — SODIUM CHLORIDE 0.9% FLUSH
3.0000 mL | Freq: Two times a day (BID) | INTRAVENOUS | Status: DC
  Administered 2016-03-11 – 2016-03-12 (×4): 3 mL via INTRAVENOUS

## 2016-03-10 MED ORDER — IOPAMIDOL (ISOVUE-370) INJECTION 76%
INTRAVENOUS | Status: DC | PRN
  Administered 2016-03-10: 125 mL via INTRAVENOUS

## 2016-03-10 MED ORDER — HEPARIN (PORCINE) IN NACL 2-0.9 UNIT/ML-% IJ SOLN
INTRAMUSCULAR | Status: DC | PRN
  Administered 2016-03-10: 1000 mL

## 2016-03-10 MED ORDER — BIVALIRUDIN BOLUS VIA INFUSION - CUPID
INTRAVENOUS | Status: DC | PRN
  Administered 2016-03-10: 54.45 mg via INTRAVENOUS

## 2016-03-10 MED ORDER — IOPAMIDOL (ISOVUE-370) INJECTION 76%
INTRAVENOUS | Status: AC
  Filled 2016-03-10: qty 100

## 2016-03-10 SURGICAL SUPPLY — 20 items
BALLN EUPHORA RX 2.25X12 (BALLOONS) ×2
BALLN EUPHORA RX 2.5X12 (BALLOONS) ×2
BALLOON EUPHORA RX 2.25X12 (BALLOONS) ×1 IMPLANT
BALLOON EUPHORA RX 2.5X12 (BALLOONS) ×1 IMPLANT
CATH INFINITI 5 FR JL3.5 (CATHETERS) ×2 IMPLANT
CATH INFINITI 5FR ANG PIGTAIL (CATHETERS) ×2 IMPLANT
CATH INFINITI JR4 5F (CATHETERS) ×2 IMPLANT
DEVICE RAD COMP TR BAND LRG (VASCULAR PRODUCTS) ×2 IMPLANT
ELECT DEFIB PAD ADLT CADENCE (PAD) ×2 IMPLANT
GLIDESHEATH SLEND SS 6F .021 (SHEATH) ×2 IMPLANT
GUIDE CATH RUNWAY 6FR CLS3.5 (CATHETERS) ×2 IMPLANT
KIT ENCORE 26 ADVANTAGE (KITS) ×2 IMPLANT
KIT HEART LEFT (KITS) ×2 IMPLANT
PACK CARDIAC CATHETERIZATION (CUSTOM PROCEDURE TRAY) ×2 IMPLANT
STENT PROMUS PREM MR 4.0X24 (Permanent Stent) ×2 IMPLANT
TRANSDUCER W/STOPCOCK (MISCELLANEOUS) ×2 IMPLANT
TUBING CIL FLEX 10 FLL-RA (TUBING) ×2 IMPLANT
WIRE ASAHI PROWATER 180CM (WIRE) ×2 IMPLANT
WIRE COUGAR XT STRL 190CM (WIRE) ×2 IMPLANT
WIRE SAFE-T 1.5MM-J .035X260CM (WIRE) ×2 IMPLANT

## 2016-03-10 NOTE — ED Notes (Signed)
[facility]  [facility], cath lab activated, belonging sent with girlfriend.

## 2016-03-10 NOTE — Progress Notes (Signed)
CRITICAL VALUE ALERT  Critical value received:  Troponin 4.67  Date of notification:  03/10/2016  Time of notification:  2239  Critical value read back:Yes.    Nurse who received alert:  Marthann Schillerobert Jezelle Gullick  MD notified (1st page):  Onalee HuaAlvarez  Time of first page:  2242  MD notified (2nd page):  Time of second page:  Responding MD:  Onalee HuaAlvarez  Time MD responded:  2243

## 2016-03-10 NOTE — ED Notes (Signed)
EMS here to transport pt. 

## 2016-03-10 NOTE — H&P (Signed)
Physician History and Physical    Patient ID: Tyler James MRN: [id number] DOB/AGE: [birth year] 55 y.o. Admit date: 03/10/2016  Primary Care Physician: Avon Gully, MD Primary Cardiologist new  HPI: 55 yo BM transferred from [facility] with an acute inferolateral STEMI. Patient was in his usual state of health until the night prior to presentation when he experienced some off and on chest pain. 30 minutes prior to ED visit his pain recurred and was more severe. This was associated with nausea. No diaphoresis or SOB. In ED Ecg showed marked ST elevation in the inferior and lateral leads. The patient was given ASA and Heparin and SL Ntg and transferred to the cath lab. On arrival he noted improvement in his chest pain but marked ST elevation persisted. No prior cardiac history. No history of HTN, DM, or HL. No family history of CAD.   Review of systems complete and found to be negative unless listed above  History reviewed. No pertinent past medical history.  No family history of early CAD or other vascular disease.  Social History   Social History  . Marital Status: Single    Spouse Name: N/A  . Number of Children: N/A  . Years of Education: N/A   Occupational History  . currently unemployed.     Social History Main Topics  . Smoking status: Current Every Day Smoker -- 1.00 packs/day  . Smokeless tobacco: Never Used  . Alcohol Use: Yes     Comment: weekly  . Drug Use: No  . Sexual Activity: Not on file   Other Topics Concern  . Not on file   Social History Narrative    Past Surgical History  Procedure Laterality Date  . Total hip arthroplasty      bilateral     Prescriptions prior to admission  Medication Sig Dispense Refill Last Dose  . acetaminophen (TYLENOL) 500 MG tablet Take 1,000 mg by mouth as needed. For pain    10/19/2013 at Unknown time  . clindamycin (CLEOCIN) 300 MG capsule Take 1 capsule (300 mg total) by mouth every 6 (six) hours. 40 capsule 0   .  penicillin v potassium (VEETID) 500 MG tablet Take 1 tablet (500 mg total) by mouth 4 (four) times daily. 40 tablet 0   . predniSONE (DELTASONE) 10 MG tablet Take 2 tablets (20 mg total) by mouth daily. 10 tablet 0   . traMADol (ULTRAM) 50 MG tablet Take 2 tablets (100 mg total) by mouth every 6 (six) hours as needed. 16 tablet 0     Physical Exam: Blood pressure 109/64, pulse 70, temperature 98.2 F (36.8 C), temperature source Oral, resp. rate 27, height  (1.753 m), weight 72.5 kg (159 lb 13.3 oz), SpO2 100 %.  Current Weight  03/10/16 72.5 kg (159 lb 13.3 oz)  10/20/13 74.844 kg (165 lb)  10/19/13 74.844 kg (165 lb)    GENERAL:  Well appearing BM in NAD HEENT:  PERRL, EOMI, sclera are clear. Oropharynx is clear. Multiple missing dentition. NECK:  No jugular venous distention, carotid upstroke brisk and symmetric, no bruits, no thyromegaly or adenopathy LUNGS:  Clear to auscultation bilaterally CHEST:  Unremarkable HEART:  RRR,  PMI not displaced or sustained,S1 and S2 within normal limits, no S3, no S4: no clicks, no rubs, no murmurs ABD:  Soft, nontender. BS +, no masses or bruits. No hepatomegaly, no splenomegaly EXT:  2 + pulses throughout, no edema, no cyanosis no clubbing SKIN:  Warm and dry.  No rashes NEURO:  Alert and oriented x 3. Cranial nerves II through XII intact. PSYCH:  Cognitively intact    Labs:   Lab Results  Component Value Date   WBC 6.6 03/10/2016   HGB 12.9* 03/10/2016   HCT 39.3 03/10/2016   MCV 75.4* 03/10/2016   PLT 167 03/10/2016     Recent Labs Lab 03/10/16 1906 03/10/16 1911 03/10/16 2134  NA 142 144  --   K 3.3* 3.3*  --   CL 107 104  --   CO2 25  --   --   BUN 13 13  --   CREATININE 0.88 0.90 0.88  CALCIUM 9.1  --   --   PROT 7.5  --   --   BILITOT 0.5  --   --   ALKPHOS 62  --   --   ALT 22  --   --   AST 27  --   --   GLUCOSE 109* 102*  --    Lab Results  Component Value Date   TROPONINI 4.67* 03/10/2016   No results  found for: CHOL No results found for: HDL No results found for: LDLCALC No results found for: TRIG No results found for: CHOLHDL No results found for: LDLDIRECT  No results found for: PROBNP No results found for: TSH No results found for: HGBA1C  Radiology: No results found.  EKG: NSR with marked ST elevation in leads 2,3,avf, and V4-6 c/w inferolateral STEMI.   ASSESSMENT AND PLAN:  1. Acute inferolateral STEMI- will proceed with emergent cardiac cath and PCI. Start ASA, high dose statin. 2. Tobacco abuse- counsel on smoking cessation.  Signed: Riku Buttery [place], MDFACC  03/10/2016, 10:59 PM

## 2016-03-10 NOTE — ED Provider Notes (Signed)
CSN: [id number]     Arrival date & time 03/10/16  1851 History   First MD Initiated Contact with Patient 03/10/16 1901     Chief Complaint  Patient presents with  . Chest Pain      Patient is a 55 y.o. male presenting with chest pain. The history is provided by the patient.  Chest Pain Associated symptoms: no abdominal pain, no back pain and no shortness of breath   Patient presents with chest pain. Had some episodes last night that began around 25 minutes prior to arrival here. His pressure in the mid chest. No diaphoresis. Initial EKG done at 1859 showed STEMI. Code STEMI was called. This initial EKG did not crossover so a new EKG was done at 1710. No nausea. He is a smoker but has no known cardiac disease. No fevers or chills. No nausea vomiting. The pain does not radiate it is just in his midchest. He's been doing well the last couple days except for the chest pain last night. He has had some swelling to ibuprofen in the past.  History reviewed. No pertinent past medical history. Past Surgical History  Procedure Laterality Date  . Total hip arthroplasty      bilateral   No family history on file. Social History  Substance Use Topics  . Smoking status: Current Every Day Smoker -- 1.00 packs/day  . Smokeless tobacco: Never Used  . Alcohol Use: Yes     Comment: weekly    Review of Systems  Constitutional: Negative for appetite change.  Respiratory: Negative for shortness of breath.   Cardiovascular: Positive for chest pain.  Gastrointestinal: Negative for abdominal pain and blood in stool.  Musculoskeletal: Negative for back pain.  Skin: Negative for wound.  Neurological: Negative for seizures.  Hematological: Negative for adenopathy.  Psychiatric/Behavioral: Negative for confusion.      Allergies  Ibuprofen  Home Medications   Prior to Admission medications   Medication Sig Start Date End Date Taking? Authorizing Provider  acetaminophen (TYLENOL) 500 MG tablet Take  1,000 mg by mouth as needed. For pain     Historical Provider, MD  clindamycin (CLEOCIN) 300 MG capsule Take 1 capsule (300 mg total) by mouth every 6 (six) hours. 10/20/13   Devoria Albe, MD  penicillin v potassium (VEETID) 500 MG tablet Take 1 tablet (500 mg total) by mouth 4 (four) times daily. 10/19/13   Roxy Horseman, PA-C  predniSONE (DELTASONE) 10 MG tablet Take 2 tablets (20 mg total) by mouth daily. 12/29/12   Annamarie Dawley, MD  traMADol (ULTRAM) 50 MG tablet Take 2 tablets (100 mg total) by mouth every 6 (six) hours as needed. 10/20/13   Devoria Albe, MD   BP 121/68 mmHg  Pulse 60  Temp(Src) 98.2 F (36.8 C) (Oral)  Resp 22  Ht  (1.753 m)  Wt 160 lb (72.576 kg)  BMI 23.62 kg/m2  SpO2 99% Physical Exam  Constitutional: He appears well-developed.  HENT:  Head: Atraumatic.  Neck: Neck supple.  Cardiovascular: Normal rate.   Pulmonary/Chest: Effort normal. He exhibits no tenderness.  Abdominal: Soft. There is no tenderness.  Musculoskeletal: Normal range of motion.  Neurological: He is alert.  Skin: Skin is warm.    ED Course  Procedures (including critical care time) Labs Review Labs Reviewed  I-STAT CHEM 8, ED - Abnormal; Notable for the following:    Potassium 3.3 (*)    Glucose, Bld 102 (*)    Calcium, Ion 1.11 (*)  All other components within normal limits  APTT  CBC  COMPREHENSIVE METABOLIC PANEL  PROTIME-INR  I-STAT TROPOININ, ED    Imaging Review No results found. I have personally reviewed and evaluated these images and lab results as part of my medical decision-making.   EKG Interpretation   Date/Time:  Monday March 10 2016 19:10:19 EDT Ventricular Rate:  67 PR Interval:  135 QRS Duration: 76 QT Interval:  411 QTC Calculation: 434 R Axis:   93 Text Interpretation:  Sinus rhythm Inferoposterior infarct, acute (RCA)  Lateral infarct, acute Probable RV involvement, suggest recording right  precordial leads Confirmed by Lavenia Stumpo  MD, Harrold DonathNATHAN  [phone]([id number]) on 03/10/2016  7:17:20 PM      MDM   Final diagnoses:  ST elevation myocardial infarction (STEMI), unspecified artery (HCC)    Patient with acute ST elevation MI. Continued pain. Code STEMI called and discussed with Dr. SwazilandJordan who accepted the patient transfer. EMS been called and [facility]    Benjiman CoreNathan Rheanne Cortopassi, MD 03/10/16 [phone]

## 2016-03-10 NOTE — ED Notes (Signed)
Pt c/o chest tightness with sob since 1700.

## 2016-03-11 ENCOUNTER — Encounter (HOSPITAL_COMMUNITY): Payer: Self-pay | Admitting: Cardiology

## 2016-03-11 ENCOUNTER — Inpatient Hospital Stay (HOSPITAL_COMMUNITY): Payer: Medicare Other

## 2016-03-11 DIAGNOSIS — I255 Ischemic cardiomyopathy: Secondary | ICD-10-CM

## 2016-03-11 DIAGNOSIS — I2510 Atherosclerotic heart disease of native coronary artery without angina pectoris: Secondary | ICD-10-CM

## 2016-03-11 DIAGNOSIS — Z720 Tobacco use: Secondary | ICD-10-CM

## 2016-03-11 DIAGNOSIS — I2121 ST elevation (STEMI) myocardial infarction involving left circumflex coronary artery: Secondary | ICD-10-CM

## 2016-03-11 LAB — CBC
HEMATOCRIT: 38.2 % — AB (ref 39.0–52.0)
HEMOGLOBIN: 12.3 g/dL — AB (ref 13.0–17.0)
MCH: 24.3 pg — ABNORMAL LOW (ref 26.0–34.0)
MCHC: 32.2 g/dL (ref 30.0–36.0)
MCV: 75.3 fL — ABNORMAL LOW (ref 78.0–100.0)
Platelets: 167 10*3/uL (ref 150–400)
RBC: 5.07 MIL/uL (ref 4.22–5.81)
RDW: 14.7 % (ref 11.5–15.5)
WBC: 6.2 10*3/uL (ref 4.0–10.5)

## 2016-03-11 LAB — ECHOCARDIOGRAM COMPLETE
HEIGHTINCHES: 69 in
WEIGHTICAEL: 2557.34 [oz_av]

## 2016-03-11 LAB — BASIC METABOLIC PANEL
ANION GAP: 8 (ref 5–15)
BUN: 10 mg/dL (ref 6–20)
CALCIUM: 8.6 mg/dL — AB (ref 8.9–10.3)
CO2: 24 mmol/L (ref 22–32)
Chloride: 106 mmol/L (ref 101–111)
Creatinine, Ser: 0.89 mg/dL (ref 0.61–1.24)
GFR calc Af Amer: >60 mL/min (ref >60)
Glucose, Bld: 130 mg/dL — ABNORMAL HIGH (ref 65–99)
POTASSIUM: 4 mmol/L (ref 3.5–5.1)
SODIUM: 138 mmol/L (ref 135–145)

## 2016-03-11 LAB — TROPONIN I
TROPONIN I: 43.94 ng/mL — AB (ref <0.031)
Troponin I: 40.01 ng/mL (ref <0.031)
Troponin I: 24.73 ng/mL (ref <0.031)
Troponin I: 19.03 ng/mL (ref <0.031)

## 2016-03-11 LAB — MAGNESIUM: Magnesium: 2 mg/dL (ref 1.7–2.4)

## 2016-03-11 LAB — POCT ACTIVATED CLOTTING TIME: Activated Clotting Time: 574 seconds

## 2016-03-11 MED ORDER — SODIUM CHLORIDE 0.9% FLUSH: 3.0000 mL | INTRAVENOUS | Status: DC | PRN

## 2016-03-11 MED ORDER — SODIUM CHLORIDE 0.9 % IV SOLN: 250.0000 mL | INTRAVENOUS | Status: DC | PRN

## 2016-03-11 MED ORDER — POTASSIUM CHLORIDE CRYS ER 20 MEQ PO TBCR
40.0000 meq | EXTENDED_RELEASE_TABLET | ORAL | Status: AC
  Administered 2016-03-11 (×2): 40 meq via ORAL
  Filled 2016-03-11 (×2): qty 2

## 2016-03-11 MED ORDER — SODIUM CHLORIDE 0.9% FLUSH
3.0000 mL | Freq: Two times a day (BID) | INTRAVENOUS | Status: DC
  Administered 2016-03-11: 3 mL via INTRAVENOUS

## 2016-03-11 MED ORDER — ZOLPIDEM TARTRATE 5 MG PO TABS
5.0000 mg | ORAL_TABLET | Freq: Every evening | ORAL | Status: DC | PRN
  Administered 2016-03-11 – 2016-03-12 (×2): 5 mg via ORAL
  Filled 2016-03-11 (×2): qty 1

## 2016-03-11 MED ORDER — SODIUM CHLORIDE 0.9 % WEIGHT BASED INFUSION
1.0000 mL/kg/h | INTRAVENOUS | Status: DC
  Administered 2016-03-11: 1 mL/kg/h via INTRAVENOUS

## 2016-03-11 MED ORDER — METOPROLOL TARTRATE 12.5 MG HALF TABLET
12.5000 mg | ORAL_TABLET | Freq: Two times a day (BID) | ORAL | Status: DC
Start: 2016-03-11 — End: 2016-03-13
  Administered 2016-03-11 – 2016-03-13 (×6): 12.5 mg via ORAL
  Filled 2016-03-11 (×6): qty 1

## 2016-03-11 NOTE — Progress Notes (Addendum)
CARDIAC REHAB PHASE I   PRE:  Rate/Rhythm: 69 SR  BP:  Sitting: 119/71        SaO2: 98 RA  MODE:  Ambulation: 350 ft   POST:  Rate/Rhythm: 68 SR  BP:  Sitting: 121/76         SaO2: 97 RA  Pt ambulated 350 ft on RA, handheld assist, steady gait, tolerated well, no specific complaints other than pt states he is "tired." Began MI/stent education with pt and brother at bedside.  Reviewed risk factors, tobacco cessation (pt declined fake cigarette), MI book, anti-platelet therapy, and stent card (pt would like to wait to receive his stent card until he is closer to discharge). Pt verbalized understanding. Pt to bed per pt request after walk, call bell within reach. Will continue to follow for ambulation/education.   [id number] Joylene GrapesEmily C Cary Wilford, RN, BSN 03/11/2016 3:14 PM

## 2016-03-11 NOTE — Progress Notes (Signed)
Echocardiogram 2D Echocardiogram has been performed.  Dorothey BasemanReel, Illias Pantano M 03/11/2016, 10:08 AM

## 2016-03-11 NOTE — Progress Notes (Signed)
SUBJECTIVE: No chest pain or SOB.   Tele: sinus  BP 107/70 mmHg  Pulse 65  Temp(Src) 97.9 F (36.6 C) (Oral)  Resp 19  Ht  (1.753 m)  Wt 159 lb 13.3 oz (72.5 kg)  BMI 23.59 kg/m2  SpO2 96%  Intake/Output Summary (Last 24 hours) at 03/11/16 1611 Last data filed at 03/11/16 1300  Gross per 24 hour  Intake 1424.55 ml  Output   1425 ml  Net  -0.45 ml    PHYSICAL EXAM General: Well developed, well nourished, in no acute distress. Alert and oriented x 3.  Psych:  Good affect, responds appropriately Neck: No JVD. No masses noted.  Lungs: Clear bilaterally with no wheezes or rhonci noted.  Heart: RRR with no murmurs noted. Abdomen: Bowel sounds are present. Soft, non-tender.  Extremities: No lower extremity edema. Right wrist cath site ok.   LABS: Basic Metabolic Panel:  Recent Labs  69/62/95 1906 03/10/16 1911 03/10/16 2134 03/11/16 0227  NA 142 144  --  138  K 3.3* 3.3*  --  4.0  CL 107 104  --  106  CO2 25  --   --  24  GLUCOSE 109* 102*  --  130*  BUN 13 13  --  10  CREATININE 0.88 0.90 0.88 0.89  CALCIUM 9.1  --   --  8.6*  MG  --   --   --  2.0   CBC:  Recent Labs  03/10/16 2134 03/11/16 0227  WBC 6.6 6.2  HGB 12.9* 12.3*  HCT 39.3 38.2*  MCV 75.4* 75.3*  PLT 167 167   Cardiac Enzymes:  Recent Labs  03/10/16 2134 03/11/16 0227 03/11/16 0909  TROPONINI 4.67* 43.94* 40.01*   Current Meds: . aspirin  81 mg Oral Daily  . atorvastatin  80 mg Oral q1800  . heparin  5,000 Units Subcutaneous Q8H  . metoprolol tartrate  12.5 mg Oral BID  . sodium chloride flush  3 mL Intravenous Q12H  . sodium chloride flush  3 mL Intravenous Q12H  . ticagrelor  90 mg Oral BID   Cardiac cath 03/10/16:  Suezanne Jacquet 1st Mrg lesion, 90% stenosed.  Prox LAD lesion, 30% stenosed.  Mid RCA lesion, 95% stenosed.  Ost 2nd Mrg to 2nd Mrg lesion, 100% stenosed. Post intervention, there is a 0% residual stenosis.  Prox Cx lesion, 50% stenosed. Post intervention,  there is a 10% residual stenosis.  1. 2 vessel obstructive CAD. The culprit lesion is occlusion of a large OM2 branch. There is severe mid RCA stenosis. The first OM is very small. 2. Successful stenting of the second OM with DES. POBA of the distal LCx through the stent struts.  Plan: DAPT for one year. Recommend PCI of the RCA prior to DC. Will assess LV function with Echo.  Echo 03/11/16: Left ventricle: The cavity size was normal. Wall thickness was  normal. Systolic function was mildly reduced. The estimated  ejection fraction was in the range of 45% to 50%. There is  hypokinesis of the inferolateral myocardium. Left ventricular  diastolic function parameters were normal. - Tricuspid valve: There was mild-moderate regurgitation. - Pulmonary arteries: Systolic pressure was mildly increased. PA  peak pressure: 42 mm Hg (S). Impressions: - Hypokinesis of the inferior lateral wall with overall mildly  reduced LV function; redundant MV chordae; trace MR; mild to  moderate TR; mildly elevated pulmonary pressure.   ASSESSMENT AND PLAN:  1. CAD/STEMI:  He is stable today  post DES placement in the Circ/OM2. He was admitted last night with infero-posterior STEMI. He is on ASA, Brilinta, beta blocker and statin. He has residual high grade disease in the moderate caliber RCA. Will plan staged PCI of the RCA tomorrow.   2. Ischemic cardiomyopathy: LVEF=45-50% by echo. Will continue beta blocker. Will add Ace-inh as BP tolerates.   3. Tobacco abuse: Smoking cessation encouraged.   MCALHANY,CHRISTOPHER  4/25/20174:11 PM

## 2016-03-12 ENCOUNTER — Encounter (HOSPITAL_COMMUNITY)
Admission: EM | Disposition: A | Discharge status: Home / Self Care | Payer: Self-pay | Source: Home / Self Care | Attending: Cardiology

## 2016-03-12 ENCOUNTER — Encounter (HOSPITAL_COMMUNITY): Payer: Self-pay | Admitting: Cardiovascular Disease

## 2016-03-12 DIAGNOSIS — I25110 Atherosclerotic heart disease of native coronary artery with unstable angina pectoris: Secondary | ICD-10-CM

## 2016-03-12 DIAGNOSIS — I200 Unstable angina: Secondary | ICD-10-CM | POA: Insufficient documentation

## 2016-03-12 HISTORY — PX: CARDIAC CATHETERIZATION: SHX172

## 2016-03-12 LAB — TROPONIN I
TROPONIN I: 14.13 ng/mL — AB (ref <0.031)
TROPONIN I: 11 ng/mL — AB (ref <0.031)

## 2016-03-12 LAB — POCT ACTIVATED CLOTTING TIME: Activated Clotting Time: >1000 seconds

## 2016-03-12 SURGERY — CORONARY STENT INTERVENTION
Anesthesia: LOCAL

## 2016-03-12 MED ORDER — MIDAZOLAM HCL 2 MG/2ML IJ SOLN
INTRAMUSCULAR | Status: AC
  Filled 2016-03-12: qty 2

## 2016-03-12 MED ORDER — IOPAMIDOL (ISOVUE-370) INJECTION 76%
INTRAVENOUS | Status: AC
  Filled 2016-03-12: qty 125

## 2016-03-12 MED ORDER — FENTANYL CITRATE (PF) 100 MCG/2ML IJ SOLN
INTRAMUSCULAR | Status: DC | PRN
  Administered 2016-03-12: 25 ug via INTRAVENOUS
  Administered 2016-03-12: 50 ug via INTRAVENOUS

## 2016-03-12 MED ORDER — MIDAZOLAM HCL 2 MG/2ML IJ SOLN
INTRAMUSCULAR | Status: DC | PRN
  Administered 2016-03-12: 2 mg via INTRAVENOUS
  Administered 2016-03-12: 1 mg via INTRAVENOUS

## 2016-03-12 MED ORDER — HEPARIN SODIUM (PORCINE) 1000 UNIT/ML IJ SOLN
INTRAMUSCULAR | Status: DC | PRN
  Administered 2016-03-12: 10000 [IU] via INTRAVENOUS

## 2016-03-12 MED ORDER — NITROGLYCERIN 1 MG/10 ML FOR IR/CATH LAB
INTRA_ARTERIAL | Status: DC | PRN
  Administered 2016-03-12 (×2): 200 ug via INTRACORONARY

## 2016-03-12 MED ORDER — SODIUM CHLORIDE 0.9% FLUSH: 3.0000 mL | INTRAVENOUS | Status: DC | PRN

## 2016-03-12 MED ORDER — IOPAMIDOL (ISOVUE-370) INJECTION 76%
INTRAVENOUS | Status: DC | PRN
  Administered 2016-03-12: 125 mL via INTRAVENOUS

## 2016-03-12 MED ORDER — SODIUM CHLORIDE 0.9 % IV SOLN
INTRAVENOUS | Status: AC
  Administered 2016-03-12: 14:00:00 via INTRAVENOUS

## 2016-03-12 MED ORDER — SODIUM CHLORIDE 0.9 % IV SOLN: 250.0000 mL | INTRAVENOUS | Status: DC | PRN

## 2016-03-12 MED ORDER — NITROGLYCERIN 1 MG/10 ML FOR IR/CATH LAB
INTRA_ARTERIAL | Status: AC
  Filled 2016-03-12: qty 10

## 2016-03-12 MED ORDER — HEPARIN SODIUM (PORCINE) 1000 UNIT/ML IJ SOLN
INTRAMUSCULAR | Status: AC
  Filled 2016-03-12: qty 1

## 2016-03-12 MED ORDER — SODIUM CHLORIDE 0.9% FLUSH
3.0000 mL | Freq: Two times a day (BID) | INTRAVENOUS | Status: DC
  Administered 2016-03-12: 3 mL via INTRAVENOUS

## 2016-03-12 MED ORDER — LIDOCAINE HCL (PF) 1 % IJ SOLN
INTRAMUSCULAR | Status: DC | PRN
  Administered 2016-03-12: 3 mL

## 2016-03-12 MED ORDER — VERAPAMIL HCL 2.5 MG/ML IV SOLN
INTRAVENOUS | Status: DC | PRN
  Administered 2016-03-12: 13:00:00 via INTRA_ARTERIAL

## 2016-03-12 MED ORDER — LIDOCAINE HCL (PF) 1 % IJ SOLN
INTRAMUSCULAR | Status: AC
  Filled 2016-03-12: qty 30

## 2016-03-12 MED ORDER — HEPARIN (PORCINE) IN NACL 2-0.9 UNIT/ML-% IJ SOLN
INTRAMUSCULAR | Status: AC
  Filled 2016-03-12: qty 1500

## 2016-03-12 MED ORDER — FENTANYL CITRATE (PF) 100 MCG/2ML IJ SOLN
INTRAMUSCULAR | Status: AC
  Filled 2016-03-12: qty 2

## 2016-03-12 MED ORDER — VERAPAMIL HCL 2.5 MG/ML IV SOLN
INTRAVENOUS | Status: AC
  Filled 2016-03-12: qty 2

## 2016-03-12 MED ORDER — IOPAMIDOL (ISOVUE-370) INJECTION 76%
INTRAVENOUS | Status: AC
  Filled 2016-03-12: qty 50

## 2016-03-12 MED ORDER — HEPARIN (PORCINE) IN NACL 2-0.9 UNIT/ML-% IJ SOLN
INTRAMUSCULAR | Status: DC | PRN
  Administered 2016-03-12: 1500 mL

## 2016-03-12 SURGICAL SUPPLY — 17 items
BALLN EMERGE MR 2.0X12 (BALLOONS) ×2
BALLN ~~LOC~~ EUPHORA RX 2.25X8 (BALLOONS) ×2
BALLOON EMERGE MR 2.0X12 (BALLOONS) ×1 IMPLANT
BALLOON ~~LOC~~ EUPHORA RX 2.25X8 (BALLOONS) ×1 IMPLANT
CATH VISTA GUIDE 6FR JR4 (CATHETERS) ×2 IMPLANT
DEVICE RAD COMP TR BAND LRG (VASCULAR PRODUCTS) ×2 IMPLANT
GLIDESHEATH SLEND SS 6F .021 (SHEATH) ×2 IMPLANT
KIT ENCORE 26 ADVANTAGE (KITS) ×4 IMPLANT
KIT HEART LEFT (KITS) ×2 IMPLANT
PACK CARDIAC CATHETERIZATION (CUSTOM PROCEDURE TRAY) ×2 IMPLANT
STENT PROMUS PREM MR 2.25X12 (Permanent Stent) ×2 IMPLANT
STOPCOCK MORSE 400PSI 3WAY (MISCELLANEOUS) ×2 IMPLANT
TRANSDUCER W/STOPCOCK (MISCELLANEOUS) ×2 IMPLANT
TUBING CIL FLEX 10 FLL-RA (TUBING) ×2 IMPLANT
WIRE COUGAR XT STRL 190CM (WIRE) ×2 IMPLANT
WIRE HI TORQ WHISPER MS 190CM (WIRE) ×2 IMPLANT
WIRE SAFE-T 1.5MM-J .035X260CM (WIRE) ×2 IMPLANT

## 2016-03-12 NOTE — Interval H&P Note (Signed)
History and Physical Interval Note:  03/12/2016 12:29 PM  Jos D Vondrak  has presented today for PCI of the RCA with the diagnosis of CAD  The various methods of treatment have been discussed with the patient and family. After consideration of risks, benefits and other options for treatment, the patient has consented to  Procedure(s): Coronary Stent Intervention (N/A) as a surgical intervention .  The patient's history has been reviewed, patient examined, no change in status, stable for surgery.  I have reviewed the patient's chart and labs.  Questions were answered to the patient's satisfaction.     Sarinah Doetsch

## 2016-03-12 NOTE — H&P (View-Only) (Signed)
   SUBJECTIVE: No chest pain or SOB.   Tele: sinus  BP 118/66 mmHg  Pulse 70  Temp(Src) 98.1 F (36.7 C) (Oral)  Resp 12  Ht 5' 9" (1.753 m)  Wt 159 lb 2.8 oz (72.2 kg)  BMI 23.49 kg/m2  SpO2 99%  Intake/Output Summary (Last 24 hours) at 03/12/16 0659 Last data filed at 03/12/16 0500  Gross per 24 hour  Intake 1336.04 ml  Output   2650 ml  Net -1313.96 ml    PHYSICAL EXAM General: Well developed, well nourished, in no acute distress. Alert and oriented x 3.  Psych:  Good affect, responds appropriately Neck: No JVD. No masses noted.  Lungs: Clear bilaterally with no wheezes or rhonci noted.  Heart: RRR with no murmurs noted. Abdomen: Bowel sounds are present. Soft, non-tender.  Extremities: No lower extremity edema.   LABS: Basic Metabolic Panel:  Recent Labs  03/10/16 1906 03/10/16 1911 03/10/16 2134 03/11/16 0227  NA 142 144  --  138  K 3.3* 3.3*  --  4.0  CL 107 104  --  106  CO2 25  --   --  24  GLUCOSE 109* 102*  --  130*  BUN 13 13  --  10  CREATININE 0.88 0.90 0.88 0.89  CALCIUM 9.1  --   --  8.6*  MG  --   --   --  2.0   CBC:  Recent Labs  03/10/16 2134 03/11/16 0227  WBC 6.6 6.2  HGB 12.9* 12.3*  HCT 39.3 38.2*  MCV 75.4* 75.3*  PLT 167 167   Cardiac Enzymes:  Recent Labs  03/11/16 1514 03/11/16 2125 03/12/16 0325  TROPONINI 24.73* 19.03* 14.13*   Fasting Lipid Panel: No results for input(s): CHOL, HDL, LDLCALC, TRIG, CHOLHDL, LDLDIRECT in the last 72 hours.  Current Meds: . aspirin  81 mg Oral Daily  . atorvastatin  80 mg Oral q1800  . heparin  5,000 Units Subcutaneous Q8H  . metoprolol tartrate  12.5 mg Oral BID  . sodium chloride flush  3 mL Intravenous Q12H  . sodium chloride flush  3 mL Intravenous Q12H  . sodium chloride flush  3 mL Intravenous Q12H  . ticagrelor  90 mg Oral BID   Cardiac cath 03/10/16:  Ost 1st Mrg lesion, 90% stenosed.  Prox LAD lesion, 30% stenosed.  Mid RCA lesion, 95% stenosed.  Ost 2nd  Mrg to 2nd Mrg lesion, 100% stenosed. Post intervention, there is a 0% residual stenosis.  Prox Cx lesion, 50% stenosed. Post intervention, there is a 10% residual stenosis.  1. 2 vessel obstructive CAD. The culprit lesion is occlusion of a large OM2 branch. There is severe mid RCA stenosis. The first OM is very small. 2. Successful stenting of the second OM with DES. POBA of the distal LCx through the stent struts.  Plan: DAPT for one year. Recommend PCI of the RCA prior to DC. Will assess LV function with Echo.  Echo 03/11/16: Left ventricle: The cavity size was normal. Wall thickness was  normal. Systolic function was mildly reduced. The estimated  ejection fraction was in the range of 45% to 50%. There is  hypokinesis of the inferolateral myocardium. Left ventricular  diastolic function parameters were normal. - Tricuspid valve: There was mild-moderate regurgitation. - Pulmonary arteries: Systolic pressure was mildly increased. PA  peak pressure: 42 mm Hg (S). Impressions: - Hypokinesis of the inferior lateral wall with overall mildly  reduced LV function; redundant MV chordae; trace   MR; mild to  moderate TR; mildly elevated pulmonary pressure.   ASSESSMENT AND PLAN:  1. CAD/STEMI: He is stable today post DES placement in the Circ/OM2. He was admitted 03/10/16 an with infero-posterior STEMI. He is on ASA, Brilinta, beta blocker and statin. He has residual high grade disease in the moderate caliber RCA. Will plan staged PCI of the RCA today.  2. Ischemic cardiomyopathy: LVEF=45-50% by echo. Will continue beta blocker. Will add Ace-inh post cath if BP stable.    3. Tobacco abuse: Smoking cessation encouraged.    Tyler James  4/26/20176:59 AM   

## 2016-03-12 NOTE — Progress Notes (Signed)
CARDIAC REHAB PHASE I   PRE:  Rate/Rhythm: 70 SR  BP:  Supine:   Sitting: 130/89  Standing:    SaO2:   MODE:  Ambulation: 700 ft   POST:  Rate/Rhythm: 79 SR  BP:  Supine:   Sitting: 126/84  Standing:    SaO2: 100%RA 1117-1200 Pt walked 700 ft with steady gait. Tolerated well. No CP. MI education completed with pt and wife except diet. Left sheet of heart healthy diet for pt to review if he has time. Discussed CRP 2 and will refer to [facility]. Gave brochure. Pt has brilinta card. Stressed importance of brilinta with stent. Reviewed NTG use. Discussed smoking cessation and gave handout and fake cigarette. Pt may need nicotine patch prior to discharge. He will consider as he is having urge to smoke now.   Luetta Nuttingharlene Eunice Winecoff, RN BSN  03/12/2016 11:57 AM

## 2016-03-12 NOTE — Progress Notes (Signed)
SUBJECTIVE: No chest pain or SOB.   Tele: sinus  BP 118/66 mmHg  Pulse 70  Temp(Src) 98.1 F (36.7 C) (Oral)  Resp 12  Ht  (1.753 m)  Wt 159 lb 2.8 oz (72.2 kg)  BMI 23.49 kg/m2  SpO2 99%  Intake/Output Summary (Last 24 hours) at 03/12/16 0659 Last data filed at 03/12/16 0500  Gross per 24 hour  Intake 1336.04 ml  Output   2650 ml  Net -1313.96 ml    PHYSICAL EXAM General: Well developed, well nourished, in no acute distress. Alert and oriented x 3.  Psych:  Good affect, responds appropriately Neck: No JVD. No masses noted.  Lungs: Clear bilaterally with no wheezes or rhonci noted.  Heart: RRR with no murmurs noted. Abdomen: Bowel sounds are present. Soft, non-tender.  Extremities: No lower extremity edema.   LABS: Basic Metabolic Panel:  Recent Labs  40/98/11 1906 03/10/16 1911 03/10/16 2134 03/11/16 0227  NA 142 144  --  138  K 3.3* 3.3*  --  4.0  CL 107 104  --  106  CO2 25  --   --  24  GLUCOSE 109* 102*  --  130*  BUN 13 13  --  10  CREATININE 0.88 0.90 0.88 0.89  CALCIUM 9.1  --   --  8.6*  MG  --   --   --  2.0   CBC:  Recent Labs  03/10/16 2134 03/11/16 0227  WBC 6.6 6.2  HGB 12.9* 12.3*  HCT 39.3 38.2*  MCV 75.4* 75.3*  PLT 167 167   Cardiac Enzymes:  Recent Labs  03/11/16 1514 03/11/16 2125 03/12/16 0325  TROPONINI 24.73* 19.03* 14.13*   Fasting Lipid Panel: No results for input(s): CHOL, HDL, LDLCALC, TRIG, CHOLHDL, LDLDIRECT in the last 72 hours.  Current Meds: . aspirin  81 mg Oral Daily  . atorvastatin  80 mg Oral q1800  . heparin  5,000 Units Subcutaneous Q8H  . metoprolol tartrate  12.5 mg Oral BID  . sodium chloride flush  3 mL Intravenous Q12H  . sodium chloride flush  3 mL Intravenous Q12H  . sodium chloride flush  3 mL Intravenous Q12H  . ticagrelor  90 mg Oral BID   Cardiac cath 03/10/16:  Suezanne Jacquet 1st Mrg lesion, 90% stenosed.  Prox LAD lesion, 30% stenosed.  Mid RCA lesion, 95% stenosed.  Ost 2nd  Mrg to 2nd Mrg lesion, 100% stenosed. Post intervention, there is a 0% residual stenosis.  Prox Cx lesion, 50% stenosed. Post intervention, there is a 10% residual stenosis.  1. 2 vessel obstructive CAD. The culprit lesion is occlusion of a large OM2 branch. There is severe mid RCA stenosis. The first OM is very small. 2. Successful stenting of the second OM with DES. POBA of the distal LCx through the stent struts.  Plan: DAPT for one year. Recommend PCI of the RCA prior to DC. Will assess LV function with Echo.  Echo 03/11/16: Left ventricle: The cavity size was normal. Wall thickness was  normal. Systolic function was mildly reduced. The estimated  ejection fraction was in the range of 45% to 50%. There is  hypokinesis of the inferolateral myocardium. Left ventricular  diastolic function parameters were normal. - Tricuspid valve: There was mild-moderate regurgitation. - Pulmonary arteries: Systolic pressure was mildly increased. PA  peak pressure: 42 mm Hg (S). Impressions: - Hypokinesis of the inferior lateral wall with overall mildly  reduced LV function; redundant MV chordae; trace  MR; mild to  moderate TR; mildly elevated pulmonary pressure.   ASSESSMENT AND PLAN:  1. CAD/STEMI: He is stable today post DES placement in the Circ/OM2. He was admitted 03/10/16 an with infero-posterior STEMI. He is on ASA, Brilinta, beta blocker and statin. He has residual high grade disease in the moderate caliber RCA. Will plan staged PCI of the RCA today.  2. Ischemic cardiomyopathy: LVEF=45-50% by echo. Will continue beta blocker. Will add Ace-inh post cath if BP stable.    3. Tobacco abuse: Smoking cessation encouraged.    Tyler James  4/26/20176:59 AM

## 2016-03-13 ENCOUNTER — Encounter (HOSPITAL_COMMUNITY): Payer: Self-pay | Admitting: Student

## 2016-03-13 ENCOUNTER — Telehealth: Payer: Self-pay | Admitting: Adult Health

## 2016-03-13 LAB — CBC
HCT: 41.4 % (ref 39.0–52.0)
Hemoglobin: 13.5 g/dL (ref 13.0–17.0)
MCH: 24.6 pg — ABNORMAL LOW (ref 26.0–34.0)
MCHC: 32.6 g/dL (ref 30.0–36.0)
MCV: 75.5 fL — ABNORMAL LOW (ref 78.0–100.0)
Platelets: 182 10*3/uL (ref 150–400)
RBC: 5.48 MIL/uL (ref 4.22–5.81)
RDW: 14.7 % (ref 11.5–15.5)
WBC: 6 10*3/uL (ref 4.0–10.5)

## 2016-03-13 LAB — BASIC METABOLIC PANEL
Anion gap: 9 (ref 5–15)
BUN: 9 mg/dL (ref 6–20)
CO2: 23 mmol/L (ref 22–32)
Calcium: 8.9 mg/dL (ref 8.9–10.3)
Chloride: 108 mmol/L (ref 101–111)
Creatinine, Ser: 0.87 mg/dL (ref 0.61–1.24)
GFR calc Af Amer: >60 mL/min (ref >60)
GFR calc non Af Amer: >60 mL/min (ref >60)
Glucose, Bld: 117 mg/dL — ABNORMAL HIGH (ref 65–99)
Potassium: 4 mmol/L (ref 3.5–5.1)
Sodium: 140 mmol/L (ref 135–145)

## 2016-03-13 LAB — LIPID PANEL
CHOL/HDL RATIO: 3.2 ratio
CHOLESTEROL: 144 mg/dL (ref 0–200)
HDL: 45 mg/dL (ref >40)
LDL Cholesterol: 69 mg/dL (ref 0–99)
TRIGLYCERIDES: 149 mg/dL (ref <150)
VLDL: 30 mg/dL (ref 0–40)

## 2016-03-13 MED ORDER — ASPIRIN 81 MG PO CHEW: 81.0000 mg | CHEWABLE_TABLET | Freq: Every day | ORAL | Status: DC

## 2016-03-13 MED ORDER — METOPROLOL TARTRATE 25 MG PO TABS: 12.5000 mg | ORAL_TABLET | Freq: Two times a day (BID) | ORAL | Status: DC

## 2016-03-13 MED ORDER — TICAGRELOR 90 MG PO TABS: 90.0000 mg | ORAL_TABLET | Freq: Two times a day (BID) | ORAL | Status: DC

## 2016-03-13 MED ORDER — NITROGLYCERIN 0.4 MG SL SUBL: 0.4000 mg | SUBLINGUAL_TABLET | SUBLINGUAL | Status: DC | PRN

## 2016-03-13 MED ORDER — ATORVASTATIN CALCIUM 80 MG PO TABS: 80.0000 mg | ORAL_TABLET | Freq: Every day | ORAL | Status: DC

## 2016-03-13 NOTE — Telephone Encounter (Signed)
TCM apt w/ K. Lawrence on Thursday 03/20/16

## 2016-03-13 NOTE — Discharge Summary (Signed)
Discharge Summary    Patient ID: Tyler James,  MRN: [id number], DOB/AGE: [birth year] 57 y.o.  Admit date: 03/10/2016 Discharge date: 03/13/2016  Primary Care Provider: [facility] Primary Cardiologist: New - F/U in [place]  Discharge Diagnoses    Principal Problem:   ST elevation (STEMI) myocardial infarction involving left circumflex coronary artery [facility]) Active Problems:   Tobacco abuse   Unstable angina (HCC)   History of Present Illness     Tyler James is a 55 y.o. male with past medical history of tobacco abuse and no prior cardiac history who presented to [facility] on 03/10/2016 for evaluation of chest pain starting earlier that day and was found to have ST elevation in the inferior and lateral leads. CODE STEMI was activated and he was transported to [facility] for urgent cardiac catheterization.   Hospital Course     Consultants: None  His cardiac catheterization showed 2 vessel obstructive disease with 100% stenosis of the OM2 (DES placed), 50% stenosis of LCx (s/p POBA) and 95% stenosis in the mid-RCA. OM1 had 90% stenosis but the vessel was noted to be very small. It was recommended he undergo PCI of the RCA prior to discharge. He was started on DAPT with ASA and Brilinta following the procedure along with a high-intensity statin and BB.  The following morning, he denied any repeat episodes of chest discomfort. He ambulated over 332ft with cardiac rehab without any anginal symptoms. An echocardiogram showed an EF of 45-50% with hypokinesis of the inferolateral myocardium.   On 03/12/2016, he underwent staged PCI of the RCA with a DES placed successfully. No complications were noted during the procedure.  He continued to progress and did not experience any anginal symptoms overnight or the morning after his procedure. He was tolerating his current medications well and vitals and lab results appeared stable. His SBP was in the 90's overnight, therefore an  ACE-I was not started at this time.  He was last examined by Dr. Clifton James and deemed stable for discharge. A 7-10 day TCM appointment has been arranged at the [facility] location. He was provided Rx's for his new medications along with a 30-day card to use with Brilinta. The patient assistance form was also completed for Brilinta prior to discharge.   _____________  Discharge Vitals Blood pressure 114/56, pulse 62, temperature 97.7 F (36.5 C), temperature source Oral, resp. rate 25, height  (1.753 m), weight 159 lb 2.8 oz (72.2 kg), SpO2 97 %.  Filed Weights   03/10/16 1859 03/10/16 2101 03/12/16 0400  Weight: 160 lb (72.576 kg) 159 lb 13.3 oz (72.5 kg) 159 lb 2.8 oz (72.2 kg)    Labs & Radiologic Studies     CBC  Recent Labs  03/11/16 0227 03/13/16 0214  WBC 6.2 6.0  HGB 12.3* 13.5  HCT 38.2* 41.4  MCV 75.3* 75.5*  PLT 167 182   Basic Metabolic Panel  Recent Labs  03/11/16 0227 03/13/16 0214  NA 138 140  K 4.0 4.0  CL 106 108  CO2 24 23  GLUCOSE 130* 117*  BUN 10 9  CREATININE 0.89 0.87  CALCIUM 8.6* 8.9  MG 2.0  --    Liver Function Tests  Recent Labs  03/10/16 1906  AST 27  ALT 22  ALKPHOS 62  BILITOT 0.5  PROT 7.5  ALBUMIN 4.3   No results for input(s): LIPASE, AMYLASE in the last 72 hours. Cardiac Enzymes  Recent Labs  03/11/16 2125 03/12/16  0325 03/12/16 1058  TROPONINI 19.03* 14.13* 11.00*   BNP Invalid input(s): POCBNP D-Dimer No results for input(s): DDIMER in the last 72 hours. Hemoglobin A1C No results for input(s): HGBA1C in the last 72 hours. Fasting Lipid Panel  Recent Labs  03/13/16 0214  CHOL 144  HDL 45  LDLCALC 69  TRIG 149  CHOLHDL 3.2    Diagnostic Studies/Procedures    Cardiac Catheterization: 03/10/2016  Ost 1st Mrg lesion, 90% stenosed.  Prox LAD lesion, 30% stenosed.  Mid RCA lesion, 95% stenosed.  Ost 2nd Mrg to 2nd Mrg lesion, 100% stenosed. Post intervention, there is a 0% residual  stenosis.  Prox Cx lesion, 50% stenosed. Post intervention, there is a 10% residual stenosis.  1. 2 vessel obstructive CAD. The culprit lesion is occlusion of a large OM2 branch. There is severe mid RCA stenosis. The first OM is very small. 2. Successful stenting of the second OM with DES. POBA of the distal LCx through the stent struts.  Plan: DAPT for one year. Recommend PCI of the RCA prior to DC. Will assess LV function with Echo.   Coronary Stent Intervention: 03/12/2016  Prox RCA lesion, 99% stenosed. Post intervention, there is a 0% residual stenosis.  1. Severe stenosis mid RCA 2. Successful PTCA/DES x 1 mid RCA  Recommendations: Will continue DAPT with ASA and Brilinta. Continue statin and beta blocker.    Echocardiogram: 03/11/2016 Study Conclusions - Left ventricle: The cavity size was normal. Wall thickness was  normal. Systolic function was mildly reduced. The estimated  ejection fraction was in the range of 45% to 50%. There is  hypokinesis of the inferolateral myocardium. Left ventricular  diastolic function parameters were normal. - Tricuspid valve: There was mild-moderate regurgitation. - Pulmonary arteries: Systolic pressure was mildly increased. PA  peak pressure: 42 mm Hg (S).  Impressions: - Hypokinesis of the inferior lateral wall with overall mildly  reduced LV function; redundant MV chordae; trace MR; mild to  moderate TR; mildly elevated pulmonary pressure.  Disposition   Pt is being discharged home today in good condition.  Follow-up Plans & Appointments    Follow-up Information    Follow up with Joni Reining, NP.   Specialties:  Nurse Practitioner, Radiology, Cardiology   Why:  Cardiology Hospital Follow-Up  on 03/20/2016 at 1:30PM.   Contact information:   [address] [place] [address] [phone]      Discharge Instructions    AMB Referral to Cardiac Rehabilitation - Phase II    Complete by:  As directed   Diagnosis:   STEMI     Amb Referral to Cardiac Rehabilitation    Complete by:  As directed   Diagnosis:   STEMI Coronary Stents       Diet - low sodium heart healthy    Complete by:  As directed      Discharge instructions    Complete by:  As directed   PLEASE REMEMBER TO BRING ALL OF YOUR MEDICATIONS TO EACH OF YOUR FOLLOW-UP OFFICE VISITS.  PLEASE ATTEND ALL SCHEDULED FOLLOW-UP APPOINTMENTS.   Activity: Increase activity slowly as tolerated. You may shower, but no soaking baths (or swimming) for 1 week. You may not return to work until cleared by your cardiologist. No lifting over 10 lbs for 4 weeks. No sexual activity for 4 weeks.   Wound Care: You may wash cath site gently with soap and water. Keep cath site clean and dry. If you notice pain, swelling, bleeding or pus at  your cath site, please call [phone].     Increase activity slowly    Complete by:  As directed            Discharge Medications   Current Discharge Medication List    START taking these medications   Details  aspirin 81 MG chewable tablet Chew 1 tablet (81 mg total) by mouth daily.    atorvastatin (LIPITOR) 80 MG tablet Take 1 tablet (80 mg total) by mouth daily at 6 PM. Qty: 30 tablet, Refills: 6    metoprolol tartrate (LOPRESSOR) 25 MG tablet Take 0.5 tablets (12.5 mg total) by mouth 2 (two) times daily. Qty: 60 tablet, Refills: 6    nitroGLYCERIN (NITROSTAT) 0.4 MG SL tablet Place 1 tablet (0.4 mg total) under the tongue every 5 (five) minutes as needed for chest pain. Qty: 25 tablet, Refills: 3    ticagrelor (BRILINTA) 90 MG TABS tablet Take 1 tablet (90 mg total) by mouth 2 (two) times daily. Qty: 60 tablet, Refills: 11         Aspirin prescribed at discharge?  Yes High Intensity Statin Prescribed? (Lipitor 40-80mg  or Crestor 20-40mg ): Yes Beta Blocker Prescribed? Yes For EF 45% or less, Was ACEI/ARB Prescribed? No: Hypotension ADP Receptor Inhibitor Prescribed? (i.e. Plavix etc.-Includes Medically  Managed Patients): Yes For EF <40%, Aldosterone Inhibitor Prescribed? No: N/A Was EF assessed during THIS hospitalization? Yes Was Cardiac Rehab II ordered? (Included Medically managed Patients): Yes   Allergies Allergies  Allergen Reactions  . Ibuprofen Swelling    Swelling of face    Outstanding Labs/Studies   None   Duration of Discharge Encounter   Greater than 30 minutes including physician time.  Signed, Ellsworth LennoxBrittany M Belanna Manring, PA-C 03/13/2016, 11:02 AM

## 2016-03-13 NOTE — Progress Notes (Addendum)
SUBJECTIVE: No chest pain or SOB.   Tele: SINUS  BP 100/64 mmHg  Pulse 62  Temp(Src) 97.9 F (36.6 C) (Oral)  Resp 25  Ht 5\' 9"  (1.753 m)  Wt 159 lb 2.8 oz (72.2 kg)  BMI 23.49 kg/m2  SpO2 97%  Intake/Output Summary (Last 24 hours) at 03/13/16 0656 Last data filed at 03/13/16 0000  Gross per 24 hour  Intake 1136.25 ml  Output   1300 ml  Net -163.75 ml    PHYSICAL EXAM General: Well developed, well nourished, in no acute distress. Alert and oriented x 3.  Psych:  Good affect, responds appropriately Neck: No JVD. No masses noted.  Lungs: Clear bilaterally with no wheezes or rhonci noted.  Heart: RRR with no murmurs noted. Abdomen: Bowel sounds are present. Soft, non-tender.  Extremities: No lower extremity edema.   LABS: Basic Metabolic Panel:  Recent Labs  16/08/9603/25/17 0227 03/13/16 0214  NA 138 140  K 4.0 4.0  CL 106 108  CO2 24 23  GLUCOSE 130* 117*  BUN 10 9  CREATININE 0.89 0.87  CALCIUM 8.6* 8.9  MG 2.0  --    CBC:  Recent Labs  03/11/16 0227 03/13/16 0214  WBC 6.2 6.0  HGB 12.3* 13.5  HCT 38.2* 41.4  MCV 75.3* 75.5*  PLT 167 182   Cardiac Enzymes:  Recent Labs  03/11/16 2125 03/12/16 0325 03/12/16 1058  TROPONINI 19.03* 14.13* 11.00*   Current Meds: . aspirin  81 mg Oral Daily  . atorvastatin  80 mg Oral q1800  . metoprolol tartrate  12.5 mg Oral BID  . sodium chloride flush  3 mL Intravenous Q12H  . sodium chloride flush  3 mL Intravenous Q12H  . sodium chloride flush  3 mL Intravenous Q12H  . ticagrelor  90 mg Oral BID    Cardiac cath 03/10/16:  Suezanne Jacquetst 1st Mrg lesion, 90% stenosed.  Prox LAD lesion, 30% stenosed.  Mid RCA lesion, 95% stenosed.  Ost 2nd Mrg to 2nd Mrg lesion, 100% stenosed. Post intervention, there is a 0% residual stenosis.  Prox Cx lesion, 50% stenosed. Post intervention, there is a 10% residual stenosis.  1. 2 vessel obstructive CAD. The culprit lesion is occlusion of a large OM2 branch. There is  severe mid RCA stenosis. The first OM is very small. 2. Successful stenting of the second OM with DES. POBA of the distal LCx through the stent struts.  Plan: DAPT for one year. Recommend PCI of the RCA prior to DC. Will assess LV function with Echo.  Echo 03/11/16: Left ventricle: The cavity size was normal. Wall thickness was  normal. Systolic function was mildly reduced. The estimated  ejection fraction was in the range of 45% to 50%. There is  hypokinesis of the inferolateral myocardium. Left ventricular  diastolic function parameters were normal. - Tricuspid valve: There was mild-moderate regurgitation. - Pulmonary arteries: Systolic pressure was mildly increased. PA  peak pressure: 42 mm Hg (S). Impressions: - Hypokinesis of the inferior lateral wall with overall mildly  reduced LV function; redundant MV chordae; trace MR; mild to  moderate TR; mildly elevated pulmonary pressure.   ASSESSMENT AND PLAN:  1. CAD/STEMI: He is stable today post DES placement in the Circ/OM2 on 03/10/16 and s/p DES placement RCA yesterday. He was admitted 03/10/16 an with infero-posterior STEMI. He is on ASA, Brilinta, beta blocker and statin. Will check fasting lipids this am.   2. Ischemic cardiomyopathy: LVEF=45-50% by echo. Will continue beta  blocker. Will not add ace-inh with low BP. Can be added at f/u if BP stable.     3. Tobacco abuse: Smoking cessation encouraged.    Dispo: D/C home today. Follow up [facility] or office APP in [place] in 1 week.   Tyler James  4/27/20176:56 AM

## 2016-03-13 NOTE — Progress Notes (Signed)
Per Junius Creamerebbie Dowell, CM, she provided pt with Brilinta 30-day free card and pt assistance forms.

## 2016-03-13 NOTE — Progress Notes (Signed)
CARDIAC REHAB PHASE I   PRE:  Rate/Rhythm: 71 SR  BP:  Supine:   Sitting:   Standing: 119/68   SaO2:   MODE:  Ambulation: 700 ft   POST:  Rate/Rhythm: 80 SR  BP:  Supine:   Sitting: 108/61  Standing:    SaO2:  0922-0950 Pt walked 700 ft without CP. Tolerated well. Gave pt stent cards. Reviewed heart healthy diet choices. Pt voiced understanding. Ready to go home.   Tyler Nuttingharlene Asir Bingley, RN BSN  03/13/2016 9:47 AM

## 2016-03-13 NOTE — Progress Notes (Signed)
Pt discharged to home with all belongings and instructions. Transported to car per wheelchair with family in attendance

## 2016-03-14 NOTE — Telephone Encounter (Signed)
[facility].  Patient understands to follow up with provider Joni ReiningKathryn Lawrence, NP on 03[facility]16 [facility]. Patient understands discharge instructions? yes Patient understands medications and regiment? yes Patient understands to bring all medications to this visit? yes

## 2016-03-15 ENCOUNTER — Telehealth: Payer: Self-pay | Admitting: Physician Assistant

## 2016-03-15 NOTE — Telephone Encounter (Signed)
Patient paged after hour answering service for complaint of diarrhea and headache after finishing taking his medication yesterday, he was admitted with STEMI from 4/24 to 4/27. He started having diarrhea and headache after he took his last medication which is lipitor.   I have not convinced it is the lipitor causing his symptom nor am I 100% convinced his symptom is medication side effect since the symptom only started yesterday. I have advised him to continue current medication, if still has symptom in 1-2 days, he may cut lipitor down to 40mg  daily.  Ramond DialSigned, Avianna Moynahan PA Pager: [phone]

## 2016-03-20 ENCOUNTER — Encounter: Payer: Self-pay | Admitting: Adult Health

## 2016-03-20 ENCOUNTER — Ambulatory Visit (INDEPENDENT_AMBULATORY_CARE_PROVIDER_SITE_OTHER): Payer: Medicare Other | Admitting: Adult Health

## 2016-03-20 VITALS — BP 128/71 | HR 80 | Ht 69.0 in | Wt 160.0 lb

## 2016-03-20 DIAGNOSIS — I2510 Atherosclerotic heart disease of native coronary artery without angina pectoris: Secondary | ICD-10-CM | POA: Diagnosis not present

## 2016-03-20 DIAGNOSIS — E7800 Pure hypercholesterolemia, unspecified: Secondary | ICD-10-CM

## 2016-03-20 DIAGNOSIS — Z720 Tobacco use: Secondary | ICD-10-CM

## 2016-03-20 NOTE — Progress Notes (Signed)
Cardiology Office Note   Date:  03/20/2016   ID:  Tyler James, DOB Nov 02, 1961, MRN [id number]  PCP:  Avon Gully, MD  Cardiologist: Clifton James: / Joni Reining, NP   Chief Complaint  Patient presents with  . Coronary Artery Disease      History of Present Illness: Tyler James is a 55 y.o. male who presents for posthospitalization followup after admission for inferior lateral ST elevation MI. He was admitted to [facility] hospital on 03/10/2016. Cardiac catheterization was completed, his cardiac catheterization showed 2 vessel obstructive disease with 100% stenosis of the OM2 (DES placed), 50% stenosis of LCx (s/p POBA) and 95% stenosis in the mid-RCA. OM1 had 90% stenosis but the vessel was noted to be very small. On 03/12/2016, he underwent staged PCI of the RCA with a DES placed successfully. The patient was sent home on dual antiplatelet therapy with aspirin and Brilinta.he was also started on beta blocker, statin, and nitroglycerin sublingual when necessary. He'll need to be established with a [place] cardiologist.  He states since being seen last he has decreased his dose of Lipitor as it was causing significant diarrhea. He spoke with someone in our office who made this recommendation. Since that time he is feeling much better. He denies any bleeding bruising chest pain or dyspnea. He is walking a mile a day. He he has cut back to approximately 2 cigarettes a day. And is using an electronic cigarette. He plans on quitting altogether.    Past Medical History  Diagnosis Date  . CAD (coronary artery disease)     a. 02/2016: STEMI - 100% stenosis OM2 w/ DES placed, POBA to LCx, and staged PCI of 95% stenosed RCA w/ DES.  . Ischemic cardiomyopathy     a. 02/2016: EF 45-50% w/ diffuse inferolateral hypokinesis    Past Surgical History  Procedure Laterality Date  . Total hip arthroplasty      bilateral  . Cardiac catheterization N/A 03/10/2016    Procedure: Left Heart Cath and Coronary  Angiography;  Surgeon: Peter M Swaziland, MD;  Location: [facility] INVASIVE CV LAB;  Service: Cardiovascular;  Laterality: N/A;  . Cardiac catheterization N/A 03/10/2016    Procedure: Coronary Stent Intervention;  Surgeon: Peter M Swaziland, MD;  Location: [facility] INVASIVE CV LAB;  Service: Cardiovascular;  Laterality: N/A;  . Cardiac catheterization N/A 03/12/2016    Procedure: Coronary Stent Intervention;  Surgeon: Kathleene Hazel, MD;  Location: [facility] INVASIVE CV LAB;  Service: Cardiovascular;  Laterality: N/A;     Current Outpatient Prescriptions  Medication Sig Dispense Refill  . aspirin 81 MG chewable tablet Chew 1 tablet (81 mg total) by mouth daily.    Marland Kitchen atorvastatin (LIPITOR) 80 MG tablet Take 1 tablet (80 mg total) by mouth daily at 6 PM. 30 tablet 6  . metoprolol tartrate (LOPRESSOR) 25 MG tablet Take 0.5 tablets (12.5 mg total) by mouth 2 (two) times daily. 60 tablet 6  . nitroGLYCERIN (NITROSTAT) 0.4 MG SL tablet Place 1 tablet (0.4 mg total) under the tongue every 5 (five) minutes as needed for chest pain. 25 tablet 3  . ticagrelor (BRILINTA) 90 MG TABS tablet Take 1 tablet (90 mg total) by mouth 2 (two) times daily. 60 tablet 11   No current facility-administered medications for this visit.    Allergies:   Ibuprofen    Social History:  The patient  reports that he has been smoking.  He has never used smokeless tobacco. He reports that he drinks alcohol. He reports  that he does not use illicit drugs.   Family History:  The patient's family history is not on file.    ROS: All other systems are reviewed and negative. Unless otherwise mentioned in H&P    PHYSICAL EXAM: VS:  BP 128/71 mmHg  Pulse 80  Ht 5\' 9"  (1.753 m)  Wt 160 lb (72.576 kg)  BMI 23.62 kg/m2  SpO2 98% , BMI Body mass index is 23.62 kg/(m^2). GEN: Well nourished, well developed, in no acute distress HEENT: normal Neck: no JVD, carotid bruits, or masses Cardiac: RRR; no murmurs, rubs, or gallops,no edema   Respiratory:  clear to auscultation bilaterally, normal work of breathing GI: soft, nontender, nondistended, + BS MS: no deformity or atrophy Skin: warm and dry, no rash Neuro:  Strength and sensation are intact Psych: euthymic mood, full affect   Recent Labs: 03/10/2016: ALT 22 03/11/2016: Magnesium 2.0 03/13/2016: BUN 9; Creatinine, Ser 0.87; Hemoglobin 13.5; Platelets 182; Potassium 4.0; Sodium 140    Lipid Panel    Component Value Date/Time   CHOL 144 03/13/2016 0214   TRIG 149 03/13/2016 0214   HDL 45 03/13/2016 0214   CHOLHDL 3.2 03/13/2016 0214   VLDL 30 03/13/2016 0214   LDLCALC 69 03/13/2016 0214      Wt Readings from Last 3 Encounters:  03/20/16 160 lb (72.576 kg)  03/12/16 159 lb 2.8 oz (72.2 kg)  10/20/13 165 lb (74.844 kg)    ASSESSMENT AND PLAN:  1. Coronary artery disease: Status post acute inferior lateral MI of the circumflex and right coronary artery. The patient has drug-eluting stents placed to both sites with 100% occlusion reduced to 0%. He continues on dual antiplatelet therapy. He is unable to afford Brilinta and therefore samples were provided and a drug-resistant form he is also provided. We'll see him again in 3 months unless symptomatic. Copies of his catheterization report are provided.  2. Hypercholesterolemia: he was unable to tolerate high-dose statin and 80 mg daily. This was decreased to 40 mg daily posthospitalization. He is tolerating a lower dose better without any side effects of diarrhea as he experienced in the past. We'll leave him on this dose.  3. Tobacco abuse: he has cut down considerably on his tobacco habit, and plans on quitting altogether. I've encouraged him in this lifestyle change.  Current medicines are reviewed at length with the patient today.    Labs/ tests ordered today include:  No orders of the defined types were placed in this encounter.     Disposition:   FU with Cardiologist to be established in 3  months.  Signed, Joni ReiningKathryn Hollyanne Schloesser, NP  03/20/2016 1:39 PM    [facility] [address], [place], [place] [address] Phone: [phone]; Fax: [phone]

## 2016-03-20 NOTE — Progress Notes (Signed)
Name: Tyler James    DOB: 1961-01-03  Age: 55 y.o.  MR#: [id number]       PCP:  Avon Gully, MD      Insurance: Payor: MEDICARE / Plan: MEDICARE PART A AND B / Product Type: *No Product type* /   CC:   No chief complaint on file.   VS Filed Vitals:   03/20/16 1318  BP: 128/71  Pulse: 80  Height:  (1.753 m)  Weight: 160 lb (72.576 kg)  SpO2: 98%    Weights Current Weight  03/20/16 160 lb (72.576 kg)  03/12/16 159 lb 2.8 oz (72.2 kg)  10/20/13 165 lb (74.844 kg)    Blood Pressure  BP Readings from Last 3 Encounters:  03/20/16 128/71  03/13/16 114/56  10/20/13 168/102     Admit date:  (Not on file) Last encounter with RMR:  03/13/2016   Allergy Ibuprofen  Current Outpatient Prescriptions  Medication Sig Dispense Refill  . aspirin 81 MG chewable tablet Chew 1 tablet (81 mg total) by mouth daily.    Marland Kitchen atorvastatin (LIPITOR) 80 MG tablet Take 1 tablet (80 mg total) by mouth daily at 6 PM. 30 tablet 6  . metoprolol tartrate (LOPRESSOR) 25 MG tablet Take 0.5 tablets (12.5 mg total) by mouth 2 (two) times daily. 60 tablet 6  . nitroGLYCERIN (NITROSTAT) 0.4 MG SL tablet Place 1 tablet (0.4 mg total) under the tongue every 5 (five) minutes as needed for chest pain. 25 tablet 3  . ticagrelor (BRILINTA) 90 MG TABS tablet Take 1 tablet (90 mg total) by mouth 2 (two) times daily. 60 tablet 11   No current facility-administered medications for this visit.    Discontinued Meds:   There are no discontinued medications.  Patient Active Problem List   Diagnosis Date Noted  . Unstable angina (HCC)   . ST elevation (STEMI) myocardial infarction involving left circumflex coronary artery (HCC) 03/10/2016  . Tobacco abuse 03/10/2016    LABS    Component Value Date/Time   NA 140 03/13/2016 0214   NA 138 03/11/2016 0227   NA 144 03/10/2016 1911   K 4.0 03/13/2016 0214   K 4.0 03/11/2016 0227   K 3.3* 03/10/2016 1911   CL 108 03/13/2016 0214   CL 106 03/11/2016 0227   CL  104 03/10/2016 1911   CO2 23 03/13/2016 0214   CO2 24 03/11/2016 0227   CO2 25 03/10/2016 1906   GLUCOSE 117* 03/13/2016 0214   GLUCOSE 130* 03/11/2016 0227   GLUCOSE 102* 03/10/2016 1911   BUN 9 03/13/2016 0214   BUN 10 03/11/2016 0227   BUN 13 03/10/2016 1911   CREATININE 0.87 03/13/2016 0214   CREATININE 0.89 03/11/2016 0227   CREATININE 0.88 03/10/2016 2134   CALCIUM 8.9 03/13/2016 0214   CALCIUM 8.6* 03/11/2016 0227   CALCIUM 9.1 03/10/2016 1906   GFRNONAA >60 03/13/2016 0214   GFRNONAA >60 03/11/2016 0227   GFRNONAA >60 03/10/2016 2134   GFRAA >60 03/13/2016 0214   GFRAA >60 03/11/2016 0227   GFRAA >60 03/10/2016 2134   CMP     Component Value Date/Time   NA 140 03/13/2016 0214   K 4.0 03/13/2016 0214   CL 108 03/13/2016 0214   CO2 23 03/13/2016 0214   GLUCOSE 117* 03/13/2016 0214   BUN 9 03/13/2016 0214   CREATININE 0.87 03/13/2016 0214   CALCIUM 8.9 03/13/2016 0214   PROT 7.5 03/10/2016 1906   ALBUMIN 4.3 03/10/2016 1906   AST 27  03/10/2016 1906   ALT 22 03/10/2016 1906   ALKPHOS 62 03/10/2016 1906   BILITOT 0.5 03/10/2016 1906   GFRNONAA >60 03/13/2016 0214   GFRAA >60 03/13/2016 0214       Component Value Date/Time   WBC 6.0 03/13/2016 0214   WBC 6.2 03/11/2016 0227   WBC 6.6 03/10/2016 2134   HGB 13.5 03/13/2016 0214   HGB 12.3* 03/11/2016 0227   HGB 12.9* 03/10/2016 2134   HCT 41.4 03/13/2016 0214   HCT 38.2* 03/11/2016 0227   HCT 39.3 03/10/2016 2134   MCV 75.5* 03/13/2016 0214   MCV 75.3* 03/11/2016 0227   MCV 75.4* 03/10/2016 2134    Lipid Panel     Component Value Date/Time   CHOL 144 03/13/2016 0214   TRIG 149 03/13/2016 0214   HDL 45 03/13/2016 0214   CHOLHDL 3.2 03/13/2016 0214   VLDL 30 03/13/2016 0214   LDLCALC 69 03/13/2016 0214    ABG    Component Value Date/Time   TCO2 23 03/10/2016 1911     No results found for: TSH BNP (last 3 results) No results for input(s): BNP in the last 8760 hours.  ProBNP (last 3  results) No results for input(s): PROBNP in the last 8760 hours.  Cardiac Panel (last 3 results) No results for input(s): CKTOTAL, CKMB, TROPONINI, RELINDX in the last 72 hours.  Iron/TIBC/Ferritin/ %Sat No results found for: IRON, TIBC, FERRITIN, IRONPCTSAT   EKG Orders placed or performed during the hospital encounter of 03/10/16  . ED EKG  . ED EKG  . EKG 12-Lead  . EKG 12-Lead  . EKG 12-Lead immediately post procedure  . EKG 12-Lead  . EKG 12-Lead  . EKG 12-Lead  . EKG 12-Lead immediately post procedure  . EKG 12-Lead  . EKG  . EKG 12-Lead immediately post procedure  . EKG 12-Lead  . EKG 12-Lead  . EKG 12-Lead  . EKG 12-Lead immediately post procedure  . EKG 12-Lead  . EKG     Prior Assessment and Plan Problem List as of 03/20/2016      Cardiovascular and Mediastinum   ST elevation (STEMI) myocardial infarction involving left circumflex coronary artery (HCC)   Unstable angina (HCC)     Other   Tobacco abuse       Imaging: No results found.

## 2016-03-20 NOTE — Patient Instructions (Signed)
Medication Instructions:  LIPITOR 40 MG DAILY   Labwork: NONE  Testing/Procedures: NONE  Follow-Up: Your physician recommends that you schedule a follow-up appointment in: 3 MONTHS    Any Other Special Instructions Will Be Listed Below (If Applicable).   I HAVE GIVEN YOU SAMPLES OF BRILINTA   If you need a refill on your cardiac medications before your next appointment, please call your pharmacy.

## 2016-05-06 ENCOUNTER — Telehealth: Payer: Self-pay | Admitting: Adult Health

## 2016-05-06 ENCOUNTER — Other Ambulatory Visit: Payer: Self-pay | Admitting: *Deleted

## 2016-05-06 MED ORDER — TICAGRELOR 90 MG PO TABS: 90.0000 mg | ORAL_TABLET | Freq: Two times a day (BID) | ORAL | Status: DC

## 2016-05-06 NOTE — Telephone Encounter (Signed)
[facility] has 2 bottles of 90 mg Brilinta,[facility]

## 2016-05-06 NOTE — Telephone Encounter (Signed)
Needs samples of Brilinta. / tg

## 2016-05-06 NOTE — Telephone Encounter (Signed)
We have no samples, i have messaged [facility] office to see if they have some. I left message telling pt this

## 2016-05-06 NOTE — Telephone Encounter (Signed)
Samples provided per request.

## 2016-06-25 ENCOUNTER — Encounter (INDEPENDENT_AMBULATORY_CARE_PROVIDER_SITE_OTHER): Payer: Self-pay

## 2016-06-25 ENCOUNTER — Encounter: Payer: Self-pay | Admitting: Cardiology

## 2016-06-25 ENCOUNTER — Ambulatory Visit (INDEPENDENT_AMBULATORY_CARE_PROVIDER_SITE_OTHER): Payer: Medicare Other | Admitting: Cardiology

## 2016-06-25 VITALS — BP 140/68 | HR 69 | Ht 71.0 in | Wt 158.0 lb

## 2016-06-25 DIAGNOSIS — E785 Hyperlipidemia, unspecified: Secondary | ICD-10-CM

## 2016-06-25 DIAGNOSIS — I10 Essential (primary) hypertension: Secondary | ICD-10-CM | POA: Diagnosis not present

## 2016-06-25 DIAGNOSIS — I2510 Atherosclerotic heart disease of native coronary artery without angina pectoris: Secondary | ICD-10-CM

## 2016-06-25 MED ORDER — NICOTINE 21 MG/24HR TD PT24: 21.0000 mg | MEDICATED_PATCH | Freq: Every day | TRANSDERMAL | 0 refills | Status: DC

## 2016-06-25 MED ORDER — NICOTINE 7 MG/24HR TD PT24: 7.0000 mg | MEDICATED_PATCH | Freq: Every day | TRANSDERMAL | 0 refills | Status: DC

## 2016-06-25 MED ORDER — LISINOPRIL 5 MG PO TABS: 5.0000 mg | ORAL_TABLET | Freq: Every day | ORAL | 0 refills | Status: DC

## 2016-06-25 MED ORDER — NICOTINE 14 MG/24HR TD PT24: 14.0000 mg | MEDICATED_PATCH | Freq: Every day | TRANSDERMAL | 0 refills | Status: DC

## 2016-06-25 NOTE — Progress Notes (Signed)
Clinical Summary Tyler James is a 55 y.o.male last seen by NP Lyman Bishop, this is our first visit together.  1. CAD - NSTEMI 02/2016, received DES to OM2 and DES to RCA.  - echo 02/2016 LVEF 45-50%  - denies any chest pain. No SOB or DOE.  - compliant with meds  2. Hyperlipidemia - higher dose lipitor caused GI upset.    3. Tobacco abuse - down to 1/2 pack per day.  - has tried ecigs without benefit.   Past Medical History:  Diagnosis Date  . CAD (coronary artery disease)    a. 02/2016: STEMI - 100% stenosis OM2 w/ DES placed, POBA to LCx, and staged PCI of 95% stenosed RCA w/ DES.  . Ischemic cardiomyopathy    a. 02/2016: EF 45-50% w/ diffuse inferolateral hypokinesis     Allergies  Allergen Reactions  . Ibuprofen Swelling    Swelling of face     Current Outpatient Prescriptions  Medication Sig Dispense Refill  . aspirin 81 MG chewable tablet Chew 1 tablet (81 mg total) by mouth daily.    Marland Kitchen atorvastatin (LIPITOR) 40 MG tablet Take 40 mg by mouth daily.    . metoprolol tartrate (LOPRESSOR) 25 MG tablet Take 0.5 tablets (12.5 mg total) by mouth 2 (two) times daily. 60 tablet 6  . nitroGLYCERIN (NITROSTAT) 0.4 MG SL tablet Place 1 tablet (0.4 mg total) under the tongue every 5 (five) minutes as needed for chest pain. 25 tablet 3  . ticagrelor (BRILINTA) 90 MG TABS tablet Take 1 tablet (90 mg total) by mouth 2 (two) times daily. 16 tablet 0   No current facility-administered medications for this visit.      Past Surgical History:  Procedure Laterality Date  . CARDIAC CATHETERIZATION N/A 03/10/2016   Procedure: Left Heart Cath and Coronary Angiography;  Surgeon: Peter M Swaziland, MD;  Location: [facility] INVASIVE CV LAB;  Service: Cardiovascular;  Laterality: N/A;  . CARDIAC CATHETERIZATION N/A 03/10/2016   Procedure: Coronary Stent Intervention;  Surgeon: Peter M Swaziland, MD;  Location: [facility] INVASIVE CV LAB;  Service: Cardiovascular;  Laterality: N/A;  . CARDIAC CATHETERIZATION  N/A 03/12/2016   Procedure: Coronary Stent Intervention;  Surgeon: Kathleene Hazel, MD;  Location: [facility] INVASIVE CV LAB;  Service: Cardiovascular;  Laterality: N/A;  . TOTAL HIP ARTHROPLASTY     bilateral     Allergies  Allergen Reactions  . Ibuprofen Swelling    Swelling of face      No family history on file.   Social History Tyler James reports that he has been smoking.  He has been smoking about 1.00 pack per day. He has never used smokeless tobacco. Tyler James reports that he drinks alcohol.   Review of Systems CONSTITUTIONAL: No weight loss, fever, chills, weakness or fatigue.  HEENT: Eyes: No visual loss, blurred vision, double vision or yellow sclerae.No hearing loss, sneezing, congestion, runny nose or sore throat.  SKIN: No rash or itching.  CARDIOVASCULAR: per HPI RESPIRATORY: No shortness of breath, cough or sputum.  GASTROINTESTINAL: No anorexia, nausea, vomiting or diarrhea. No abdominal pain or blood.  GENITOURINARY: No burning on urination, no polyuria NEUROLOGICAL: No headache, dizziness, syncope, paralysis, ataxia, numbness or tingling in the extremities. No change in bowel or bladder control.  MUSCULOSKELETAL: No muscle, back pain, joint pain or stiffness.  LYMPHATICS: No enlarged nodes. No history of splenectomy.  PSYCHIATRIC: No history of depression or anxiety.  ENDOCRINOLOGIC: No reports of sweating, cold or heat intolerance. No  polyuria or polydipsia.  Marland Kitchen.   Physical Examination Vitals:   06/25/16 0818  Pulse: 69   Filed Weights   06/25/16 0818  Weight: 158 lb (71.7 kg)    Gen: resting comfortably, no acute distress HEENT: no scleral icterus, pupils equal round and reactive, no palptable cervical adenopathy,  CV: RRR, no m/r/g no jvd Resp: Clear to auscultation bilaterally GI: abdomen is soft, non-tender, non-distended, normal bowel sounds, no hepatosplenomegaly MSK: extremities are warm, no edema.  Skin: warm, no rash Neuro:  no focal  deficits Psych: appropriate affect   Diagnostic Studies  02/2016 Cath  Ost 1st Mrg lesion, 90% stenosed.  Prox LAD lesion, 30% stenosed.  Mid RCA lesion, 95% stenosed.  Ost 2nd Mrg to 2nd Mrg lesion, 100% stenosed. Post intervention, there is a 0% residual stenosis.  Prox Cx lesion, 50% stenosed. Post intervention, there is a 10% residual stenosis.   1. 2 vessel obstructive CAD. The culprit lesion is occlusion of a large OM2 James. There is severe mid RCA stenosis. The first OM is very small. 2. Successful stenting of the second OM with DES. POBA of the distal LCx through the stent struts.  Plan: DAPT for one year. Recommend PCI of the RCA prior to DC. Will assess LV function with Echo.  02/2016 Cath  Prox RCA lesion, 99% stenosed. Post intervention, there is a 0% residual stenosis.   1. Severe stenosis mid RCA 2. Successful PTCA/DES x 1 mid RCA  Recommendations: Will continue DAPT with ASA and Brilinta. Continue statin and beta blocker.   02/2016 echo Study Conclusions  - Left ventricle: The cavity size was normal. Wall thickness was   normal. Systolic function was mildly reduced. The estimated   ejection fraction was in the range of 45% to 50%. There is   hypokinesis of the inferolateral myocardium. Left ventricular   diastolic function parameters were normal. - Tricuspid valve: There was mild-moderate regurgitation. - Pulmonary arteries: Systolic pressure was mildly increased. PA   peak pressure: 42 mm Hg (S).  Impressions:  - Hypokinesis of the inferior lateral wall with overall mildly   reduced LV function; redundant MV chordae; trace MR; mild to   moderate TR; mildly elevated pulmonary pressure.   Assessment and Plan  1. CAD - no current symptoms, conitnue DAPT at least until 02/2017 - start lisinopril 5mg  dialy for cardiovascular benefits, borderline low LVEF. Check BMET in 2 weeks.   2. Hyperlipidemia - did not tolerate higher dose atorva, doing well  on 40mg  daily - we will continue  3. Tobacco abuse - given Rx for nicotine patches.     F/u 6 months  Tyler James, M.D.

## 2016-06-25 NOTE — Patient Instructions (Addendum)
Your physician wants you to follow-up in: 6 Months with Dr. Wyline MoodBranch.  You will receive a reminder letter in the mail two months in advance. If you don't receive a letter, please call our office to schedule the follow-up appointment.   Your physician has recommended you make the following change in your medication:  Start Nicoderm Patch  21 mg (1 patch daily for 6 weeks)  14 mg ( 1 patch daily for 2 weeks)  7 mg ( 1 patch daily for 2 weeks)   Start Lisinopril 5 mg Daily   Your physician recommends that you return for lab work in: 2 Weeks (07-09-16)  If you need a refill on your cardiac medications before your next appointment, please call your pharmacy.  Thank you for choosing [facility]!

## 2016-09-17 ENCOUNTER — Other Ambulatory Visit: Payer: Self-pay | Admitting: Student

## 2016-09-18 ENCOUNTER — Emergency Department (HOSPITAL_COMMUNITY)
Admission: EM | Admit: 2016-09-18 | Discharge: 2016-09-18 | Disposition: A | Discharge status: Home / Self Care | Payer: Medicare Other | Attending: Emergency Medicine | Admitting: Emergency Medicine

## 2016-09-18 ENCOUNTER — Encounter (HOSPITAL_COMMUNITY): Payer: Self-pay | Admitting: Emergency Medicine

## 2016-09-18 DIAGNOSIS — R21 Rash and other nonspecific skin eruption: Secondary | ICD-10-CM | POA: Insufficient documentation

## 2016-09-18 DIAGNOSIS — F17200 Nicotine dependence, unspecified, uncomplicated: Secondary | ICD-10-CM | POA: Diagnosis not present

## 2016-09-18 DIAGNOSIS — Z79899 Other long term (current) drug therapy: Secondary | ICD-10-CM | POA: Diagnosis not present

## 2016-09-18 DIAGNOSIS — K0889 Other specified disorders of teeth and supporting structures: Secondary | ICD-10-CM | POA: Insufficient documentation

## 2016-09-18 DIAGNOSIS — I2510 Atherosclerotic heart disease of native coronary artery without angina pectoris: Secondary | ICD-10-CM | POA: Diagnosis not present

## 2016-09-18 DIAGNOSIS — R220 Localized swelling, mass and lump, head: Secondary | ICD-10-CM | POA: Diagnosis not present

## 2016-09-18 DIAGNOSIS — Z7982 Long term (current) use of aspirin: Secondary | ICD-10-CM | POA: Diagnosis not present

## 2016-09-18 LAB — CBC WITH DIFFERENTIAL/PLATELET
BASOS ABS: 0 10*3/uL (ref 0.0–0.1)
BASOS PCT: 1 %
EOS ABS: 0.3 10*3/uL (ref 0.0–0.7)
EOS PCT: 5 %
HCT: 41.8 % (ref 39.0–52.0)
Hemoglobin: 14 g/dL (ref 13.0–17.0)
LYMPHS PCT: 18 %
Lymphs Abs: 1.1 10*3/uL (ref 0.7–4.0)
MCH: 25.7 pg — ABNORMAL LOW (ref 26.0–34.0)
MCHC: 33.5 g/dL (ref 30.0–36.0)
MCV: 76.7 fL — AB (ref 78.0–100.0)
MONO ABS: 0.5 10*3/uL (ref 0.1–1.0)
Monocytes Relative: 8 %
Neutro Abs: 4.4 10*3/uL (ref 1.7–7.7)
Neutrophils Relative %: 68 %
PLATELETS: 224 10*3/uL (ref 150–400)
RBC: 5.45 MIL/uL (ref 4.22–5.81)
RDW: 15.4 % (ref 11.5–15.5)
WBC: 6.4 10*3/uL (ref 4.0–10.5)

## 2016-09-18 LAB — BASIC METABOLIC PANEL
Anion gap: 6 (ref 5–15)
BUN: 13 mg/dL (ref 6–20)
CALCIUM: 9.2 mg/dL (ref 8.9–10.3)
CO2: 25 mmol/L (ref 22–32)
CREATININE: 0.75 mg/dL (ref 0.61–1.24)
Chloride: 107 mmol/L (ref 101–111)
GFR calc Af Amer: >60 mL/min (ref >60)
GLUCOSE: 114 mg/dL — AB (ref 65–99)
Potassium: 3.9 mmol/L (ref 3.5–5.1)
SODIUM: 138 mmol/L (ref 135–145)

## 2016-09-18 MED ORDER — PREDNISONE 20 MG PO TABS: 40.0000 mg | ORAL_TABLET | Freq: Every day | ORAL | 0 refills | Status: AC

## 2016-09-18 MED ORDER — FAMOTIDINE 20 MG PO TABS
40.0000 mg | ORAL_TABLET | Freq: Every day | ORAL | Status: DC
  Administered 2016-09-18: 40 mg via ORAL
  Filled 2016-09-18: qty 2

## 2016-09-18 MED ORDER — EPINEPHRINE 0.3 MG/0.3ML IJ SOAJ: 0.3000 mg | Freq: Once | INTRAMUSCULAR | 0 refills | Status: AC

## 2016-09-18 MED ORDER — PREDNISONE 50 MG PO TABS
60.0000 mg | ORAL_TABLET | Freq: Once | ORAL | Status: AC
  Administered 2016-09-18: 60 mg via ORAL
  Filled 2016-09-18: qty 1

## 2016-09-18 NOTE — Discharge Instructions (Signed)
You were seen in the ED today with face swelling. I believe that your symptoms are most likely due to a mild allergic reaction. I have ordered some steroids and and EpiPen to use at home as needed. Follow up with your PCP in the coming week to review your medications.   Return to the ED immediately if you need to use your EpiPen, you experience fever, chills, difficulty swallowing, or difficulty breathing.

## 2016-09-18 NOTE — ED Provider Notes (Signed)
Emergency Department Provider Note  By signing my name below, I, Sonum Patel, attest that this documentation has been prepared under the direction and in the presence of Maia PlanJoshua G Shaniquia Brafford, MD. Electronically Signed: Sonum Patel, Neurosurgeoncribe. 09/18/16. 9:54 AM.   I have reviewed the triage vital signs and the nursing notes.   HISTORY  Chief Complaint Facial Swelling   HPI Tyler James is a 55 y.o. male presents to the ED complaining of constant facial swelling and rash to the face and arms that began this morning. Patient reports having left lower dental pain with associated left gum/facial swelling for which he took Tylenol last night. He has a known allergy to ibuprofen but states he denies any other known allergies to other medications. He states the rash is to the chin area which he reports has mild drainage when touched. He denies sick contacts at home. He denies SOB, trouble swallowing, voice changes, fever, chills, CP, abdominal pain, nausea, vomiting, diarrhea, dysuria.    Past Medical History:  Diagnosis Date  . CAD (coronary artery disease)    a. 02/2016: STEMI - 100% stenosis OM2 w/ DES placed, POBA to LCx, and staged PCI of 95% stenosed RCA w/ DES.  . Ischemic cardiomyopathy    a. 02/2016: EF 45-50% w/ diffuse inferolateral hypokinesis    Patient Active Problem List   Diagnosis Date Noted  . Unstable angina (HCC)   . ST elevation (STEMI) myocardial infarction involving left circumflex coronary artery (HCC) 03/10/2016  . Tobacco abuse 03/10/2016    Past Surgical History:  Procedure Laterality Date  . CARDIAC CATHETERIZATION N/A 03/10/2016   Procedure: Left Heart Cath and Coronary Angiography;  Surgeon: Peter M SwazilandJordan, MD;  [facility];  Service: Cardiovascular;  Laterality: N/A;  . CARDIAC CATHETERIZATION N/A 03/10/2016   Procedure: Coronary Stent Intervention;  Surgeon: Peter M SwazilandJordan, MD;  [facility];  Service: Cardiovascular;  Laterality:  N/A;  . CARDIAC CATHETERIZATION N/A 03/12/2016   Procedure: Coronary Stent Intervention;  Surgeon: Kathleene Hazelhristopher D McAlhany, MD;  Location: [facility] INVASIVE CV LAB;  Service: Cardiovascular;  Laterality: N/A;  . TOTAL HIP ARTHROPLASTY     bilateral    Current Outpatient Rx  . Order #: [id number] Class: OTC  . Order #: [id number] Class: Historical Med  . Order #: [id number] Class: Normal  . Order #: [id number] Class: Normal  . Order #: [id number] Class: Normal  . Order #: [id number] Class: Normal  . Order #: [id number] Class: Print  . Order #: [id number] Class: Normal  . Order #: [id number] Class: Normal  . Order #: [id number] Class: Normal  . [START ON 09/19/2016] Order #: [id number] Class: Print    Allergies Ibuprofen  History reviewed. No pertinent family history.  Social History Social History  Substance Use Topics  . Smoking status: Current Every Day Smoker    Packs/day: 1.00  . Smokeless tobacco: Never Used  . Alcohol use Yes     Comment: weekly    Review of Systems Constitutional: No fever/chills Eyes: No visual changes. ENT: +facial swelling, +dental pain, No sore throat. No trouble swallowing. No voice change Cardiovascular: Denies chest pain. Respiratory: Denies shortness of breath. Gastrointestinal: No abdominal pain.  No nausea, no vomiting.  No diarrhea.  No constipation. Genitourinary: Negative for dysuria. Musculoskeletal: Negative for back pain. Skin: + rash. Neurological: Negative for headaches, focal weakness or numbness.  10-point ROS otherwise negative. ____________________________________________   PHYSICAL EXAM:  VITAL SIGNS: ED Triage Vitals  Enc Vitals Group     BP 09/18/16 0946  146/80     Pulse Rate 09/18/16 0946 81     Resp 09/18/16 0946 16     Temp 09/18/16 0946 98.2 F (36.8 C)     Temp Source 09/18/16 0946 Oral     SpO2 09/18/16 0946 99 %     Weight 09/18/16 0947 165 lb (74.8 kg)     Height 09/18/16 0947 5\' 10"  (1.778 m)   Constitutional: Alert and  oriented. Well appearing and in no acute distress. Eyes: Conjunctivae are normal.  Head: Atraumatic. Nose: No congestion/rhinnorhea. Mouth/Throat: Mucous membranes are moist.  Oropharynx non-erythematous. Normal tone of voice. No respiratory distress. Managing oral secretions.  Neck: No stridor.   Cardiovascular: Normal rate, regular rhythm. Good peripheral circulation. Grossly normal heart sounds.   Respiratory: Normal respiratory effort.  No retractions. Lungs CTAB. Gastrointestinal: Soft and nontender. No distention.  Musculoskeletal: No lower extremity tenderness nor edema. No gross deformities of extremities. Neurologic:  Normal speech and language. No gross focal neurologic deficits are appreciated.  Skin:  Skin is warm, dry and intact. Faint palpable rash over the chin. Single pustule with no drainage or ulceration. No mucosal involvement.   ____________________________________________   LABS (all labs ordered are listed, but only abnormal results are displayed)  Labs Reviewed  BASIC METABOLIC PANEL - Abnormal; Notable for the following:       Result Value   Glucose, Bld 114 (*)    All other components within normal limits  CBC WITH DIFFERENTIAL/PLATELET - Abnormal; Notable for the following:    MCV 76.7 (*)    MCH 25.7 (*)    All other components within normal limits   ____________________________________________   PROCEDURES  Procedure(s) performed:   Procedures  None ____________________________________________   INITIAL IMPRESSION / ASSESSMENT AND PLAN / ED COURSE  Pertinent labs & imaging results that were available during my care of the patient were reviewed by me and considered in my medical decision making (see chart for details).  Patient presents to the emergency department for evaluation of generalized face edema and rash. Rash is atypical in appearance. No obvious infectious component. The rash does not involve the mucosal surface. The patient's airway  is patent. He is in no acute distress. He took Benadryl prior to arrival. Plan for steroids and Pepcid with possible allergic component. Plan for brief period of observation and reassessment.  Reviewed patient's baseline labs. Normal kidney function. No progression of the patient's edema. I find it difficult to appreciate any swelling but the patient states that he is slightly more swollen. No interval worsening of symptoms or respiratory symptoms. Unclear etiology for the patient's rash. No new medications. Plan for steroid burst along with Benadryl as needed for itching. Discharged with epinephrine and PCP follow up.    At this time, I do not feel there is any life-threatening condition present. I have reviewed and discussed all results (EKG, imaging, lab, urine as appropriate), exam findings with patient. I have reviewed nursing notes and appropriate previous records.  I feel the patient is safe to be discharged home without further emergent workup. Discussed usual and customary return precautions. Patient and family (if present) verbalize understanding and are comfortable with this plan.  Patient will follow-up with their primary care provider. If they do not have a primary care provider, information for follow-up has been provided to them. All questions have been answered.  ____________________________________________  FINAL CLINICAL IMPRESSION(S) / ED DIAGNOSES  Final diagnoses:  Facial swelling     MEDICATIONS GIVEN DURING  THIS VISIT:  Medications  predniSONE (DELTASONE) tablet 60 mg (60 mg Oral Given 09/18/16 1013)     NEW OUTPATIENT MEDICATIONS STARTED DURING THIS VISIT:  Discharge Medication List as of 09/18/2016 11:17 AM    START taking these medications   Details  EPINEPHrine (EPIPEN 2-PAK) 0.3 mg/0.3 mL IJ SOAJ injection Inject 0.3 mLs (0.3 mg total) into the muscle once., Starting Thu 09/18/2016, Print    predniSONE (DELTASONE) 20 MG tablet Take 2 tablets (40 mg total) by  mouth daily., Starting Fri 09/19/2016, Until Tue 09/23/2016, Print        Note:  This document was prepared using Dragon voice recognition software and may include unintentional dictation errors.  Alona BeneJoshua Prashant Glosser, MD Emergency Medicine   I personally performed the services described in this documentation, which was scribed in my presence. The recorded information has been reviewed and is accurate.      Maia PlanJoshua G Townes Fuhs, MD 09/18/16 [phone]

## 2016-09-18 NOTE — ED Triage Notes (Signed)
Pt reports facial swelling with rash, itching and pustules since 2am this morning.  Pt denies throat or oral swelling.  Pt reports taking a benadryl at 530 this morning.  Pt reports that his face feels "tight".  Pt alert and oriented at this time.

## 2017-05-17 ENCOUNTER — Other Ambulatory Visit: Payer: Self-pay | Admitting: Student

## 2017-06-08 ENCOUNTER — Other Ambulatory Visit: Payer: Self-pay | Admitting: Cardiology

## 2017-06-08 ENCOUNTER — Other Ambulatory Visit: Payer: Self-pay | Admitting: Student

## 2017-06-08 NOTE — Telephone Encounter (Signed)
This is Dr. Branch's pt 

## 2017-06-08 NOTE — Telephone Encounter (Signed)
Pt taking lipitor 40 mg daily, forgot about lisinopril- not taking, could not afford Nicoderm, still smoking, made fu apt for 8/27 at 8:40 with Dr Wyline MoodBranch

## 2017-06-14 ENCOUNTER — Other Ambulatory Visit: Payer: Self-pay | Admitting: Cardiology

## 2017-07-13 ENCOUNTER — Ambulatory Visit (INDEPENDENT_AMBULATORY_CARE_PROVIDER_SITE_OTHER): Payer: Medicare Other | Admitting: Cardiology

## 2017-07-13 ENCOUNTER — Encounter: Payer: Self-pay | Admitting: Cardiology

## 2017-07-13 VITALS — BP 134/80 | HR 60 | Ht 69.0 in | Wt 163.0 lb

## 2017-07-13 DIAGNOSIS — I2510 Atherosclerotic heart disease of native coronary artery without angina pectoris: Secondary | ICD-10-CM

## 2017-07-13 DIAGNOSIS — E782 Mixed hyperlipidemia: Secondary | ICD-10-CM

## 2017-07-13 MED ORDER — LOSARTAN POTASSIUM 25 MG PO TABS: 12.5000 mg | ORAL_TABLET | Freq: Every day | ORAL | 3 refills | Status: DC

## 2017-07-13 NOTE — Patient Instructions (Signed)
Medication Instructions:  STOP BRILINTA  START LOSARTAN 12.5 MG ( 1/2 TABLET ) DAILY   Labwork: 2 WEEKS  BMET FASTING LIPID   Testing/Procedures: NONE  Follow-Up: Your physician wants you to follow-up in: 1 YEAR.  You will receive a reminder letter in the mail two months in advance. If you don't receive a letter, please call our office to schedule the follow-up appointment.    Any Other Special Instructions Will Be Listed Below (If Applicable).     If you need a refill on your cardiac medications before your next appointment, please call your pharmacy.

## 2017-07-13 NOTE — Progress Notes (Signed)
Clinical Summary Tyler James is a 56 y.o.male seen today for follow up of the following medical problems.   1. CAD - NSTEMI 02/2016, received DES to OM2 and DES to RCA.  - echo 02/2016 LVEF 45-50%   - no recent chest pain. No significant DOE. Mild SOB with laying down sometimes, infrequent - compliant with meds. He thinks he did start lisionpril after our last visit, but somehow ran out. He had an ER visit with a facial rash in 09/2017, he is not sure if he was on lisionpril at that time.   2. Hyperlipidemia - higher dose lipitor caused GI upset. Has tolerated atorvastatin 40mg  daily.      Past Medical History:  Diagnosis Date  . CAD (coronary artery disease)    a. 02/2016: STEMI - 100% stenosis OM2 w/ DES placed, POBA to LCx, and staged PCI of 95% stenosed RCA w/ DES.  . Ischemic cardiomyopathy    a. 02/2016: EF 45-50% w/ diffuse inferolateral hypokinesis     Allergies  Allergen Reactions  . Ibuprofen Swelling    Swelling of face     Current Outpatient Prescriptions  Medication Sig Dispense Refill  . aspirin 81 MG chewable tablet Chew 1 tablet (81 mg total) by mouth daily.    Marland Kitchen atorvastatin (LIPITOR) 40 MG tablet TAKE 1 TABLET BY MOUTH EVERY DAY 90 tablet 0  . BRILINTA 90 MG TABS tablet TAKE 1 TABLET BY MOUTH TWICE DAILY 60 tablet 11  . lisinopril (PRINIVIL,ZESTRIL) 5 MG tablet Take 1 tablet (5 mg total) by mouth daily. 90 tablet 0  . metoprolol tartrate (LOPRESSOR) 25 MG tablet TAKE 1/2 TABLET BY MOUTH TWICE DAILY 90 tablet 0  . nicotine (NICODERM CQ) 14 mg/24hr patch Place 1 patch (14 mg total) onto the skin daily. (Patient not taking: Reported on 09/18/2016) 14 patch 0  . nicotine (NICODERM CQ) 21 mg/24hr patch Place 1 patch (21 mg total) onto the skin daily. (Patient not taking: Reported on 09/18/2016) 42 patch 0  . nicotine (NICODERM CQ) 7 mg/24hr patch Place 1 patch (7 mg total) onto the skin daily. (Patient not taking: Reported on 09/18/2016) 14 patch 0  .  nitroGLYCERIN (NITROSTAT) 0.4 MG SL tablet Place 1 tablet (0.4 mg total) under the tongue every 5 (five) minutes as needed for chest pain. 25 tablet 3   No current facility-administered medications for this visit.      Past Surgical History:  Procedure Laterality Date  . CARDIAC CATHETERIZATION N/A 03/10/2016   Procedure: Left Heart Cath and Coronary Angiography;  Surgeon: Peter M Swaziland, MD;  Location: [facility] INVASIVE CV LAB;  Service: Cardiovascular;  Laterality: N/A;  . CARDIAC CATHETERIZATION N/A 03/10/2016   Procedure: Coronary Stent Intervention;  Surgeon: Peter M Swaziland, MD;  Location: [facility] INVASIVE CV LAB;  Service: Cardiovascular;  Laterality: N/A;  . CARDIAC CATHETERIZATION N/A 03/12/2016   Procedure: Coronary Stent Intervention;  Surgeon: Kathleene Hazel, MD;  Location: [facility] INVASIVE CV LAB;  Service: Cardiovascular;  Laterality: N/A;  . TOTAL HIP ARTHROPLASTY     bilateral     Allergies  Allergen Reactions  . Ibuprofen Swelling    Swelling of face      No family history on file.   Social History Tyler James reports that he has been smoking.  He has been smoking about 1.00 pack per day. He has never used smokeless tobacco. Tyler James reports that he drinks alcohol.   Review of Systems CONSTITUTIONAL: No weight loss, fever,  chills, weakness or fatigue.  HEENT: Eyes: No visual loss, blurred vision, double vision or yellow sclerae.No hearing loss, sneezing, congestion, runny nose or sore throat.  SKIN: No rash or itching.  CARDIOVASCULAR: per hpi RESPIRATORY: No shortness of breath, cough or sputum.  GASTROINTESTINAL: No anorexia, nausea, vomiting or diarrhea. No abdominal pain or blood.  GENITOURINARY: No burning on urination, no polyuria NEUROLOGICAL: No headache, dizziness, syncope, paralysis, ataxia, numbness or tingling in the extremities. No change in bowel or bladder control.  MUSCULOSKELETAL: No muscle, back pain, joint pain or stiffness.  LYMPHATICS: No enlarged  nodes. No history of splenectomy.  PSYCHIATRIC: No history of depression or anxiety.  ENDOCRINOLOGIC: No reports of sweating, cold or heat intolerance. No polyuria or polydipsia.  Marland Kitchen   Physical Examination Vitals:   07/13/17 0848  BP: 134/80  Pulse: 60  SpO2: 99%   Vitals:   07/13/17 0848  Weight: 163 lb (73.9 kg)  Height: 5\' 9"  (1.753 m)    Gen: resting comfortably, no acute distress HEENT: no scleral icterus, pupils equal round and reactive, no palptable cervical adenopathy,  CV: RRR, no m/r/g, no jvd Resp: Clear to auscultation bilaterally GI: abdomen is soft, non-tender, non-distended, normal bowel sounds, no hepatosplenomegaly MSK: extremities are warm, no edema.  Skin: warm, no rash Neuro:  no focal deficits Psych: appropriate affect   Diagnostic Studies 02/2016 Cath  Ost 1st Mrg lesion, 90% stenosed.  Prox LAD lesion, 30% stenosed.  Mid RCA lesion, 95% stenosed.  Ost 2nd Mrg to 2nd Mrg lesion, 100% stenosed. Post intervention, there is a 0% residual stenosis.  Prox Cx lesion, 50% stenosed. Post intervention, there is a 10% residual stenosis.  1. 2 vessel obstructive CAD. The culprit lesion is occlusion of a large OM2 Tyler James. There is severe mid RCA stenosis. The first OM is very small. 2. Successful stenting of the second OM with DES. POBA of the distal LCx through the stent struts.  Plan: DAPT for one year. Recommend PCI of the RCA prior to DC. Will assess LV function with Echo.  02/2016 Cath  Prox RCA lesion, 99% stenosed. Post intervention, there is a 0% residual stenosis.  1. Severe stenosis mid RCA 2. Successful PTCA/DES x 1 mid RCA  Recommendations: Will continue DAPT with ASA and Brilinta. Continue statin and beta blocker.   02/2016 echo Study Conclusions  - Left ventricle: The cavity size was normal. Wall thickness was normal. Systolic function was mildly reduced. The estimated ejection fraction was in the range of 45% to 50%. There  is hypokinesis of the inferolateral myocardium. Left ventricular diastolic function parameters were normal. - Tricuspid valve: There was mild-moderate regurgitation. - Pulmonary arteries: Systolic pressure was mildly increased. PA peak pressure: 42 mm Hg (S).  Impressions:  - Hypokinesis of the inferior lateral wall with overall mildly reduced LV function; redundant MV chordae; trace MR; mild to moderate TR; mildly elevated pulmonary pressure.    Assessment and Plan   1. CAD - no current symptoms, he will stop brillinta - EKG in clinic shows sinus bradycardia, no acute ischemic changes - continue other meds - unclear if facial rash was related to lisinopril, we will try losartan 12.5mg  daily instead.  Check BMET in 2 weeks  2. Hyperlipidemia - did not tolerate higher dose atorva, doing well on 40mg  daily - continue current statin       Antoine Poche, M.D.

## 2018-08-16 ENCOUNTER — Encounter (HOSPITAL_COMMUNITY): Payer: Self-pay | Admitting: Emergency Medicine

## 2018-08-16 ENCOUNTER — Emergency Department (HOSPITAL_COMMUNITY)
Admission: EM | Admit: 2018-08-16 | Discharge: 2018-08-16 | Disposition: A | Discharge status: Home / Self Care | Payer: Medicare Other | Attending: Emergency Medicine | Admitting: Emergency Medicine

## 2018-08-16 ENCOUNTER — Emergency Department (HOSPITAL_COMMUNITY): Payer: Medicare Other

## 2018-08-16 DIAGNOSIS — M25551 Pain in right hip: Secondary | ICD-10-CM | POA: Diagnosis not present

## 2018-08-16 DIAGNOSIS — I25110 Atherosclerotic heart disease of native coronary artery with unstable angina pectoris: Secondary | ICD-10-CM | POA: Insufficient documentation

## 2018-08-16 DIAGNOSIS — Z7982 Long term (current) use of aspirin: Secondary | ICD-10-CM | POA: Diagnosis not present

## 2018-08-16 DIAGNOSIS — Z96643 Presence of artificial hip joint, bilateral: Secondary | ICD-10-CM | POA: Diagnosis not present

## 2018-08-16 DIAGNOSIS — F17210 Nicotine dependence, cigarettes, uncomplicated: Secondary | ICD-10-CM | POA: Insufficient documentation

## 2018-08-16 DIAGNOSIS — M1611 Unilateral primary osteoarthritis, right hip: Secondary | ICD-10-CM | POA: Diagnosis not present

## 2018-08-16 DIAGNOSIS — Z79899 Other long term (current) drug therapy: Secondary | ICD-10-CM | POA: Insufficient documentation

## 2018-08-16 MED ORDER — CYCLOBENZAPRINE HCL 10 MG PO TABS: 10.0000 mg | ORAL_TABLET | Freq: Three times a day (TID) | ORAL | 0 refills | Status: DC

## 2018-08-16 MED ORDER — HYDROCODONE-ACETAMINOPHEN 5-325 MG PO TABS: 1.0000 | ORAL_TABLET | ORAL | 0 refills | Status: DC | PRN

## 2018-08-16 MED ORDER — ONDANSETRON HCL 4 MG PO TABS
4.0000 mg | ORAL_TABLET | Freq: Once | ORAL | Status: AC
  Administered 2018-08-16: 4 mg via ORAL
  Filled 2018-08-16: qty 1

## 2018-08-16 MED ORDER — CYCLOBENZAPRINE HCL 10 MG PO TABS
10.0000 mg | ORAL_TABLET | Freq: Once | ORAL | Status: AC
  Administered 2018-08-16: 10 mg via ORAL
  Filled 2018-08-16: qty 1

## 2018-08-16 MED ORDER — DEXAMETHASONE SODIUM PHOSPHATE 10 MG/ML IJ SOLN
10.0000 mg | Freq: Once | INTRAMUSCULAR | Status: AC
  Administered 2018-08-16: 10 mg via INTRAMUSCULAR
  Filled 2018-08-16: qty 1

## 2018-08-16 MED ORDER — HYDROCODONE-ACETAMINOPHEN 5-325 MG PO TABS
2.0000 | ORAL_TABLET | Freq: Once | ORAL | Status: AC
  Administered 2018-08-16: 2 via ORAL
  Filled 2018-08-16: qty 2

## 2018-08-16 MED ORDER — DEXAMETHASONE 4 MG PO TABS: 4.0000 mg | ORAL_TABLET | Freq: Two times a day (BID) | ORAL | 0 refills | Status: DC

## 2018-08-16 NOTE — Discharge Instructions (Addendum)
The x-ray of your hip and pelvis is negative for fracture.  Your hardware seems to be in place.  Please see Dr. Felecia Shelling at this week for possible orthopedic referral.  Heating pad to your hip may be helpful.  Please use Flexeril 3 times daily, Decadron 2 times daily with a meal.  Use Tylenol extra strength for mild pain, use Norco for more severe pain. This medication may cause drowsiness. Please do not drink, drive, or participate in activity that requires concentration while taking this medication.

## 2018-08-16 NOTE — ED Provider Notes (Signed)
She is the recent procedure with situation as he is [facility] EMERGENCY DEPARTMENT Provider Note   CSN: [id number] Arrival date & time: 08/16/18  1541     History   Chief Complaint Chief Complaint  Patient presents with  . Hip Pain    HPI Tyler James is a 57 y.o. male.  Patient is a 57 year old male who presents to the emergency department with complaint of right hip area pain.  The patient states that he has had bilateral hip replacements.  He says that he now has pain in the right hip that feels similar to the pain he had before he required hip replacement back in 2011.  He had his surgery work done at the nurse, [facility] in [place].  He says that he has not had any real significant follow-up since the surgery.  He says he has not had any pain or problems up until now.  He is quite active.  But he says he has not done anything that he did not usually do recently.  He has not had any recent fall, accident or injury to be reported.  He has not noted the joint to be hot to touch, has not had temperature elevation, nausea, or vomiting related to his hip.  He is not he is not noted any red streaks appreciated on increased redness of the hip area.  No knee pain reported.  He has pain when he changes positions, and he has pain when he walks.  Over-the-counter Tylenol does not seem to be helping his pain at all.  He presents now for assistance with this issue.  The history is provided by the patient.    Past Medical History:  Diagnosis Date  . CAD (coronary artery disease)    a. 02/2016: STEMI - 100% stenosis OM2 w/ DES placed, POBA to LCx, and staged PCI of 95% stenosed RCA w/ DES.  . Ischemic cardiomyopathy    a. 02/2016: EF 45-50% w/ diffuse inferolateral hypokinesis    Patient Active Problem List   Diagnosis Date Noted  . Unstable angina (HCC)   . ST elevation (STEMI) myocardial infarction involving left circumflex coronary artery (HCC) 03/10/2016  . Tobacco abuse  03/10/2016    Past Surgical History:  Procedure Laterality Date  . CARDIAC CATHETERIZATION N/A 03/10/2016   Procedure: Left Heart Cath and Coronary Angiography;  Surgeon: Peter M Swaziland, MD;  Location: [facility] INVASIVE CV LAB;  Service: Cardiovascular;  Laterality: N/A;  . CARDIAC CATHETERIZATION N/A 03/10/2016   Procedure: Coronary Stent Intervention;  Surgeon: Peter M Swaziland, MD;  Location: [facility] INVASIVE CV LAB;  Service: Cardiovascular;  Laterality: N/A;  . CARDIAC CATHETERIZATION N/A 03/12/2016   Procedure: Coronary Stent Intervention;  Surgeon: Kathleene Hazel, MD;  Location: [facility] INVASIVE CV LAB;  Service: Cardiovascular;  Laterality: N/A;  . TOTAL HIP ARTHROPLASTY     bilateral        Home Medications    Prior to Admission medications   Medication Sig Start Date End Date Taking? Authorizing Provider  aspirin 81 MG chewable tablet Chew 1 tablet (81 mg total) by mouth daily. 03/13/16   Strader, Lennart Pall, PA-C  atorvastatin (LIPITOR) 40 MG tablet TAKE 1 TABLET BY MOUTH EVERY DAY 06/08/17   Antoine Poche, MD  losartan (COZAAR) 25 MG tablet Take 0.5 tablets (12.5 mg total) by mouth daily. 07/13/17 10/11/17  Antoine Poche, MD  metoprolol tartrate (LOPRESSOR) 25 MG tablet TAKE 1/2 TABLET BY MOUTH TWICE DAILY 06/15/17  Antoine Poche, MD  nitroGLYCERIN (NITROSTAT) 0.4 MG SL tablet Place 1 tablet (0.4 mg total) under the tongue every 5 (five) minutes as needed for chest pain. 03/13/16   Ellsworth Lennox, PA-C    Family History History reviewed. No pertinent family history.  Social History Social History   Tobacco Use  . Smoking status: Current Every Day Smoker    Packs/day: 1.00  . Smokeless tobacco: Never Used  Substance Use Topics  . Alcohol use: Yes    Comment: weekly  . Drug use: No     Allergies   Ibuprofen   Review of Systems Review of Systems  Constitutional: Negative for activity change.       All ROS Neg except as noted in HPI  HENT: Negative for  nosebleeds.   Eyes: Negative for photophobia and discharge.  Respiratory: Negative for cough, shortness of breath and wheezing.   Cardiovascular: Negative for chest pain and palpitations.  Gastrointestinal: Negative for abdominal pain and blood in stool.  Genitourinary: Negative for dysuria, frequency and hematuria.  Musculoskeletal: Positive for arthralgias. Negative for back pain and neck pain.       Hip pain  Skin: Negative.   Neurological: Negative for dizziness, seizures and speech difficulty.  Psychiatric/Behavioral: Negative for confusion and hallucinations.     Physical Exam Updated Vital Signs BP (!) 155/69 (BP Location: Left Arm)   Pulse 81   Temp 98.2 F (36.8 C) (Oral)   Resp 16 Comment: Simultaneous filing. User may not have seen previous data.  Ht 5\' 9"  (1.753 m)   Wt 72.6 kg   SpO2 98%   BMI 23.63 kg/m   Physical Exam  Constitutional: He is oriented to person, place, and time. He appears well-developed and well-nourished.  Non-toxic appearance.  HENT:  Head: Normocephalic.  Right Ear: Tympanic membrane and external ear normal.  Left Ear: Tympanic membrane and external ear normal.  Eyes: Pupils are equal, round, and reactive to light. EOM and lids are normal.  Neck: Normal range of motion. Neck supple. Carotid bruit is not present.  Cardiovascular: Normal rate, regular rhythm, normal heart sounds, intact distal pulses and normal pulses.  Pulmonary/Chest: Breath sounds normal. No respiratory distress.  Abdominal: Soft. Bowel sounds are normal. There is no tenderness. There is no guarding.  Musculoskeletal:       Right hip: He exhibits decreased range of motion and tenderness. He exhibits no deformity.  There is pain at the right anterior hip area with attempted range of motion.  There is pain with the patient changes position from lying down to sitting to standing.  There is pain with both abduction and adduction.  No evidence of any dislocation.    Lymphadenopathy:       Head (right side): No submandibular adenopathy present.       Head (left side): No submandibular adenopathy present.    He has no cervical adenopathy.  Neurological: He is alert and oriented to person, place, and time. He has normal strength. No cranial nerve deficit or sensory deficit.  Skin: Skin is warm and dry.  Psychiatric: He has a normal mood and affect. His speech is normal.  Nursing note and vitals reviewed.    ED Treatments / Results  Labs (all labs ordered are listed, but only abnormal results are displayed) Labs Reviewed - No data to display  EKG None  Radiology Dg Hip Unilat  With Pelvis 2-3 Views Right  Result Date: 08/16/2018 CLINICAL DATA:  Severe right hip  pain EXAM: DG HIP (WITH OR WITHOUT PELVIS) 2-3V RIGHT COMPARISON:  02/09/2006 FINDINGS: Patient is status post bilateral hip resurfacing arthroplasties. No gross malalignment. Dedicated right hip views demonstrate no acute osseous finding or fracture. Pelvis intact. No diastasis. Pelvic atherosclerosis noted. IMPRESSION: No acute finding by plain radiography. Status post bilateral hip resurfacing arthroplasties. Electronically Signed   By: Judie Petit.  Shick M.D.   On: 08/16/2018 17:03    Procedures Procedures (including critical care time)  Medications Ordered in ED Medications - No data to display   Initial Impression / Assessment and Plan / ED Course  I have reviewed the triage vital signs and the nursing notes.  Pertinent labs & imaging results that were available during my care of the patient were reviewed by me and considered in my medical decision making (see chart for details).       Final Clinical Impressions(s) / ED Diagnoses MDM  Vital signs are within normal limits.  Pulse oximetry is 98% on room air.  Within normal limits by my interpretation.  There is no evidence of dislocation.  There is no shortening of the right lower extremity.  There are no hot joints appreciated.   Temperature is well within normal limits.  Pulse rate is within normal limits.  Doubt septic joint.  No recent operations or procedures.  No unusual swelling of the hip area or thigh area.  The patient has not had extreme episodes of inactivity.  Doubt DVT.  X-ray of the right hip and pelvis showed no acute findings by plain x-ray.  The patient has had status post bilateral hip resurfacing arthroscopies.  The examination favors degenerative arthritis changes involving the hip, and possible muscle strain.  The patient will be treated with some muscle relaxer, steroid, and pain medication.  I have asked the patient to call Dr. Felecia Shelling on tomorrow and set up an appointment for follow-up and recheck.  I have also asked him to discuss with Dr. Felecia Shelling the need for orthopedic referral given that he is not had orthopedic evaluation following his surgeries several years ago.  Patient is in agreement with this plan.   Final diagnoses:  Right hip pain  Arthritis of right hip    ED Discharge Orders         Ordered    dexamethasone (DECADRON) 4 MG tablet  2 times daily with meals     08/16/18 1816    HYDROcodone-acetaminophen (NORCO/VICODIN) 5-325 MG tablet  Every 4 hours PRN     08/16/18 1816    cyclobenzaprine (FLEXERIL) 10 MG tablet  3 times daily     08/16/18 1816           Ivery Quale, PA-C 08/16/18 1819    Benjiman Core, MD 08/17/18 0001

## 2018-08-16 NOTE — ED Triage Notes (Signed)
Pt states started having shooting pain from right knee up to his right hip. Denies injury.

## 2018-10-25 ENCOUNTER — Ambulatory Visit (HOSPITAL_COMMUNITY)
Admission: RE | Admit: 2018-10-25 | Discharge: 2018-10-25 | Disposition: A | Discharge status: Home / Self Care | Payer: Medicare Other | Source: Ambulatory Visit | Attending: Internal Medicine | Admitting: Internal Medicine

## 2018-10-25 ENCOUNTER — Other Ambulatory Visit (HOSPITAL_COMMUNITY): Payer: Self-pay | Admitting: Internal Medicine

## 2018-10-25 DIAGNOSIS — I255 Ischemic cardiomyopathy: Secondary | ICD-10-CM | POA: Diagnosis not present

## 2018-10-25 DIAGNOSIS — Z0000 Encounter for general adult medical examination without abnormal findings: Secondary | ICD-10-CM | POA: Diagnosis not present

## 2018-10-25 DIAGNOSIS — M19012 Primary osteoarthritis, left shoulder: Secondary | ICD-10-CM | POA: Diagnosis not present

## 2018-10-25 DIAGNOSIS — M25512 Pain in left shoulder: Secondary | ICD-10-CM | POA: Diagnosis not present

## 2018-10-25 DIAGNOSIS — E785 Hyperlipidemia, unspecified: Secondary | ICD-10-CM | POA: Diagnosis not present

## 2018-10-25 DIAGNOSIS — I5022 Chronic systolic (congestive) heart failure: Secondary | ICD-10-CM | POA: Diagnosis not present

## 2018-10-25 DIAGNOSIS — I2510 Atherosclerotic heart disease of native coronary artery without angina pectoris: Secondary | ICD-10-CM | POA: Diagnosis not present

## 2018-11-08 DIAGNOSIS — Z0001 Encounter for general adult medical examination with abnormal findings: Secondary | ICD-10-CM | POA: Diagnosis not present

## 2018-11-08 DIAGNOSIS — I2510 Atherosclerotic heart disease of native coronary artery without angina pectoris: Secondary | ICD-10-CM | POA: Diagnosis not present

## 2018-11-08 DIAGNOSIS — I255 Ischemic cardiomyopathy: Secondary | ICD-10-CM | POA: Diagnosis not present

## 2018-11-08 DIAGNOSIS — I5022 Chronic systolic (congestive) heart failure: Secondary | ICD-10-CM | POA: Diagnosis not present

## 2018-11-08 DIAGNOSIS — Z1389 Encounter for screening for other disorder: Secondary | ICD-10-CM | POA: Diagnosis not present

## 2018-11-26 ENCOUNTER — Encounter: Payer: Self-pay | Admitting: Internal Medicine

## 2018-12-24 ENCOUNTER — Emergency Department (HOSPITAL_COMMUNITY)
Admission: EM | Admit: 2018-12-24 | Discharge: 2018-12-24 | Disposition: A | Discharge status: Home / Self Care | Payer: Medicare Other | Attending: Emergency Medicine | Admitting: Emergency Medicine

## 2018-12-24 ENCOUNTER — Encounter (HOSPITAL_COMMUNITY): Payer: Self-pay | Admitting: Emergency Medicine

## 2018-12-24 ENCOUNTER — Other Ambulatory Visit: Payer: Self-pay

## 2018-12-24 DIAGNOSIS — I252 Old myocardial infarction: Secondary | ICD-10-CM | POA: Diagnosis not present

## 2018-12-24 DIAGNOSIS — Z96643 Presence of artificial hip joint, bilateral: Secondary | ICD-10-CM | POA: Insufficient documentation

## 2018-12-24 DIAGNOSIS — I2510 Atherosclerotic heart disease of native coronary artery without angina pectoris: Secondary | ICD-10-CM | POA: Insufficient documentation

## 2018-12-24 DIAGNOSIS — Z79899 Other long term (current) drug therapy: Secondary | ICD-10-CM | POA: Insufficient documentation

## 2018-12-24 DIAGNOSIS — F17210 Nicotine dependence, cigarettes, uncomplicated: Secondary | ICD-10-CM | POA: Diagnosis not present

## 2018-12-24 DIAGNOSIS — M25551 Pain in right hip: Secondary | ICD-10-CM | POA: Diagnosis not present

## 2018-12-24 DIAGNOSIS — Z7982 Long term (current) use of aspirin: Secondary | ICD-10-CM | POA: Diagnosis not present

## 2018-12-24 MED ORDER — METHOCARBAMOL 750 MG PO TABS: 750.0000 mg | ORAL_TABLET | Freq: Three times a day (TID) | ORAL | 0 refills | Status: DC | PRN

## 2018-12-24 NOTE — ED Provider Notes (Signed)
[facility] EMERGENCY DEPARTMENT Provider Note   CSN: [id number] Arrival date & time: 12/24/18  5072     History   Chief Complaint Chief Complaint  Patient presents with  . Leg Pain    HPI Tyler James is a 58 y.o. male.  Patient c/o intermittent pain to right hip area, that he initially noted when went to stand up from sitting. States intermittently gets a sharp, sudde, shooting pain that shoots from right hip ant/lat towards knee. Occasionally worse w certain movements, change of position. Lasts seconds at a time. States he was here with same symptoms a couple months ago, had xrays them, and states pain went away. No leg swelling. No low back pain. No leg numbness/weakness. No recent trauma or fall. No recent immobility or surgery. Denies fever or chills.   The history is provided by the patient.  Leg Pain  Associated symptoms: no back pain and no fever     Past Medical History:  Diagnosis Date  . CAD (coronary artery disease)    a. 02/2016: STEMI - 100% stenosis OM2 w/ DES placed, POBA to LCx, and staged PCI of 95% stenosed RCA w/ DES.  . Ischemic cardiomyopathy    a. 02/2016: EF 45-50% w/ diffuse inferolateral hypokinesis    Patient Active Problem List   Diagnosis Date Noted  . Unstable angina (HCC)   . ST elevation (STEMI) myocardial infarction involving left circumflex coronary artery (HCC) 03/10/2016  . Tobacco abuse 03/10/2016    Past Surgical History:  Procedure Laterality Date  . CARDIAC CATHETERIZATION N/A 03/10/2016   Procedure: Left Heart Cath and Coronary Angiography;  Surgeon: Peter M Swaziland, MD;  Location: [facility] INVASIVE CV LAB;  Service: Cardiovascular;  Laterality: N/A;  . CARDIAC CATHETERIZATION N/A 03/10/2016   Procedure: Coronary Stent Intervention;  Surgeon: Peter M Swaziland, MD;  Location: [facility] INVASIVE CV LAB;  Service: Cardiovascular;  Laterality: N/A;  . CARDIAC CATHETERIZATION N/A 03/12/2016   Procedure: Coronary Stent Intervention;  Surgeon:  Kathleene Hazel, MD;  Location: [facility] INVASIVE CV LAB;  Service: Cardiovascular;  Laterality: N/A;  . TOTAL HIP ARTHROPLASTY     bilateral        Home Medications    Prior to Admission medications   Medication Sig Start Date End Date Taking? Authorizing Provider  aspirin 81 MG chewable tablet Chew 1 tablet (81 mg total) by mouth daily. 03/13/16   Strader, Lennart Pall, PA-C  atorvastatin (LIPITOR) 40 MG tablet TAKE 1 TABLET BY MOUTH EVERY DAY 06/08/17   Antoine Poche, MD  cyclobenzaprine (FLEXERIL) 10 MG tablet Take 1 tablet (10 mg total) by mouth 3 (three) times daily. 08/16/18   Ivery Quale, PA-C  dexamethasone (DECADRON) 4 MG tablet Take 1 tablet (4 mg total) by mouth 2 (two) times daily with a meal. 08/16/18   Ivery Quale, PA-C  HYDROcodone-acetaminophen (NORCO/VICODIN) 5-325 MG tablet Take 1 tablet by mouth every 4 (four) hours as needed. 08/16/18   Ivery Quale, PA-C  losartan (COZAAR) 25 MG tablet Take 0.5 tablets (12.5 mg total) by mouth daily. 07/13/17 10/11/17  Antoine Poche, MD  metoprolol tartrate (LOPRESSOR) 25 MG tablet TAKE 1/2 TABLET BY MOUTH TWICE DAILY 06/15/17   Antoine Poche, MD  nitroGLYCERIN (NITROSTAT) 0.4 MG SL tablet Place 1 tablet (0.4 mg total) under the tongue every 5 (five) minutes as needed for chest pain. 03/13/16   Ellsworth Lennox, PA-C    Family History No family history on file.  Social History Social  History   Tobacco Use  . Smoking status: Current Every Day Smoker    Packs/day: 1.00  . Smokeless tobacco: Never Used  Substance Use Topics  . Alcohol use: Yes    Comment: weekly  . Drug use: No     Allergies   Ibuprofen   Review of Systems Review of Systems  Constitutional: Negative for fever.  HENT: Negative for sore throat.   Eyes: Negative for redness.  Respiratory: Negative for shortness of breath.   Cardiovascular: Negative for chest pain.  Gastrointestinal: Negative for abdominal pain.  Genitourinary:  Negative for flank pain.  Musculoskeletal: Negative for back pain.  Skin: Negative for rash.  Neurological: Negative for weakness and numbness.  Hematological: Does not bruise/bleed easily.  Psychiatric/Behavioral: Negative for confusion.     Physical Exam Updated Vital Signs BP (!) 155/88 (BP Location: Right Arm)   Pulse 82   Temp 97.6 F (36.4 C) (Oral)   Resp 14   Ht 1.753 m (5\' 9" )   Wt 77.1 kg   SpO2 98%   BMI 25.10 kg/m   Physical Exam Vitals signs and nursing note reviewed.  Constitutional:      Appearance: Normal appearance. He is well-developed.  HENT:     Head: Atraumatic.     Nose: Nose normal.     Mouth/Throat:     Mouth: Mucous membranes are moist.     Pharynx: Oropharynx is clear.  Eyes:     General: No scleral icterus.    Conjunctiva/sclera: Conjunctivae normal.  Neck:     Musculoskeletal: Normal range of motion and neck supple.     Trachea: No tracheal deviation.  Cardiovascular:     Rate and Rhythm: Normal rate.     Pulses: Normal pulses.  Pulmonary:     Effort: Pulmonary effort is normal. No accessory muscle usage or respiratory distress.     Breath sounds: Normal breath sounds.  Abdominal:     General: Bowel sounds are normal. There is no distension.     Palpations: Abdomen is soft.     Tenderness: There is no abdominal tenderness.  Musculoskeletal: Normal range of motion.        General: No swelling or tenderness.     Right lower leg: No edema.     Left lower leg: No edema.     Comments: LS spine non tender. Good rom right hip and knee without pain. Distal pulses palp. No swelling to leg. No skin changes or lesions.   Skin:    General: Skin is warm and dry.     Findings: No erythema or rash.  Neurological:     Mental Status: He is alert.     Comments: Alert, speech clear. Motor/sens grossly intact. Steady gait.   Psychiatric:        Mood and Affect: Mood normal.      ED Treatments / Results  Labs (all labs ordered are listed, but  only abnormal results are displayed) Labs Reviewed - No data to display  EKG None  Radiology No results found.  Procedures Procedures (including critical care time)  Medications Ordered in ED Medications - No data to display   Initial Impression / Assessment and Plan / ED Course  I have reviewed the triage vital signs and the nursing notes.  Pertinent labs & imaging results that were available during my care of the patient were reviewed by me and considered in my medical decision making (see chart for details).  Reviewed nursing notes and  prior charts for additional history. Prior imaging reviewed.   Pts symptoms appear most likely musculoskeletal in nature. Rec nsaid. Will also give rx robaxin. Rec pcp/ortho f/u as outpt.   Return precautions provided.      Final Clinical Impressions(s) / ED Diagnoses   Final diagnoses:  None    ED Discharge Orders    None       Cathren Laine, MD 12/24/18 [id number]

## 2018-12-24 NOTE — ED Notes (Signed)
Pt complaining of right hip and leg pain

## 2018-12-24 NOTE — Discharge Instructions (Addendum)
It was our pleasure to provide your ER care today - we hope that you feel better.  Take motrin or aleve as need for pain. You may also try taking robaxin as need for muscle pain/spasm - may make drowsy, no driving when taking.   Follow up with primary care doctor/orthopedist in the next couple of weeks.  Return to ER if worse, new symptoms, fevers, severe or intractable pain, leg swelling, numbness/weakness, other concern.

## 2018-12-24 NOTE — ED Triage Notes (Signed)
Pt c/o sudden onset of RT hip and upper leg pain when he went from sitting to standing. Pt seen 2 months ago for same complaint. States he was told he had arthritis.

## 2018-12-28 ENCOUNTER — Encounter (HOSPITAL_COMMUNITY): Payer: Self-pay | Admitting: Emergency Medicine

## 2018-12-28 ENCOUNTER — Other Ambulatory Visit: Payer: Self-pay

## 2018-12-28 ENCOUNTER — Emergency Department (HOSPITAL_COMMUNITY)
Admission: EM | Admit: 2018-12-28 | Discharge: 2018-12-29 | Disposition: A | Discharge status: Home / Self Care | Payer: Medicare Other | Attending: Emergency Medicine | Admitting: Emergency Medicine

## 2018-12-28 DIAGNOSIS — I2510 Atherosclerotic heart disease of native coronary artery without angina pectoris: Secondary | ICD-10-CM | POA: Diagnosis not present

## 2018-12-28 DIAGNOSIS — Z7982 Long term (current) use of aspirin: Secondary | ICD-10-CM | POA: Insufficient documentation

## 2018-12-28 DIAGNOSIS — R202 Paresthesia of skin: Secondary | ICD-10-CM | POA: Insufficient documentation

## 2018-12-28 DIAGNOSIS — Z79899 Other long term (current) drug therapy: Secondary | ICD-10-CM | POA: Diagnosis not present

## 2018-12-28 DIAGNOSIS — M5412 Radiculopathy, cervical region: Secondary | ICD-10-CM | POA: Insufficient documentation

## 2018-12-28 DIAGNOSIS — F17210 Nicotine dependence, cigarettes, uncomplicated: Secondary | ICD-10-CM | POA: Insufficient documentation

## 2018-12-28 NOTE — ED Triage Notes (Signed)
Pt c/o left palm tingling and numbness x .

## 2018-12-29 ENCOUNTER — Emergency Department (HOSPITAL_COMMUNITY): Payer: Medicare Other

## 2018-12-29 DIAGNOSIS — R202 Paresthesia of skin: Secondary | ICD-10-CM | POA: Diagnosis not present

## 2018-12-29 MED ORDER — PREDNISONE 50 MG PO TABS
60.0000 mg | ORAL_TABLET | Freq: Once | ORAL | Status: AC
  Administered 2018-12-29: 60 mg via ORAL
  Filled 2018-12-29: qty 1

## 2018-12-29 MED ORDER — PREDNISONE 20 MG PO TABS: ORAL_TABLET | ORAL | 0 refills | Status: DC

## 2018-12-29 NOTE — ED Provider Notes (Signed)
[facility] EMERGENCY DEPARTMENT Provider Note   CSN: [id number] Arrival date & time: 12/28/18  2245  Time seen 12:48 AM  History   Chief Complaint Chief Complaint  Patient presents with  . Numbness    HPI Tyler James is a 58 y.o. male.  HPI patient states this evening, February 11 he was laying on the couch watching TV about 10 PM and he noticed he was having some tingling and sticking sensation in the palm of his left hand.  He denies any numbness.  He states that sticking comes and goes.  He states nothing he does makes it worse, nothing does makes it better.  He denies having any pain.  He denies any neck pain, headache, or visual changes.  Patient is right-handed.  Patient has a history of hypertension and his blood pressure was elevated in the ED.  He denies any prior history of stroke.  PCP Avon Gully, MD   Past Medical History:  Diagnosis Date  . CAD (coronary artery disease)    a. 02/2016: STEMI - 100% stenosis OM2 w/ DES placed, POBA to LCx, and staged PCI of 95% stenosed RCA w/ DES.  . Ischemic cardiomyopathy    a. 02/2016: EF 45-50% w/ diffuse inferolateral hypokinesis    Patient Active Problem List   Diagnosis Date Noted  . Unstable angina (HCC)   . ST elevation (STEMI) myocardial infarction involving left circumflex coronary artery (HCC) 03/10/2016  . Tobacco abuse 03/10/2016    Past Surgical History:  Procedure Laterality Date  . CARDIAC CATHETERIZATION N/A 03/10/2016   Procedure: Left Heart Cath and Coronary Angiography;  Surgeon: Peter M Swaziland, MD;  Location: [facility] INVASIVE CV LAB;  Service: Cardiovascular;  Laterality: N/A;  . CARDIAC CATHETERIZATION N/A 03/10/2016   Procedure: Coronary Stent Intervention;  Surgeon: Peter M Swaziland, MD;  Location: [facility] INVASIVE CV LAB;  Service: Cardiovascular;  Laterality: N/A;  . CARDIAC CATHETERIZATION N/A 03/12/2016   Procedure: Coronary Stent Intervention;  Surgeon: Kathleene Hazel, MD;  Location: [facility] INVASIVE CV  LAB;  Service: Cardiovascular;  Laterality: N/A;  . TOTAL HIP ARTHROPLASTY     bilateral        Home Medications    Prior to Admission medications   Medication Sig Start Date End Date Taking? Authorizing Provider  aspirin 81 MG chewable tablet Chew 1 tablet (81 mg total) by mouth daily. 03/13/16   Strader, Lennart Pall, PA-C  atorvastatin (LIPITOR) 40 MG tablet TAKE 1 TABLET BY MOUTH EVERY DAY 06/08/17   Antoine Poche, MD  cyclobenzaprine (FLEXERIL) 10 MG tablet Take 1 tablet (10 mg total) by mouth 3 (three) times daily. 08/16/18   Ivery Quale, PA-C  dexamethasone (DECADRON) 4 MG tablet Take 1 tablet (4 mg total) by mouth 2 (two) times daily with a meal. 08/16/18   Ivery Quale, PA-C  HYDROcodone-acetaminophen (NORCO/VICODIN) 5-325 MG tablet Take 1 tablet by mouth every 4 (four) hours as needed. 08/16/18   Ivery Quale, PA-C  losartan (COZAAR) 25 MG tablet Take 0.5 tablets (12.5 mg total) by mouth daily. 07/13/17 10/11/17  Antoine Poche, MD  methocarbamol (ROBAXIN) 750 MG tablet Take 1 tablet (750 mg total) by mouth 3 (three) times daily as needed (muscle spasm/pain). 12/24/18   Cathren Laine, MD  metoprolol tartrate (LOPRESSOR) 25 MG tablet TAKE 1/2 TABLET BY MOUTH TWICE DAILY 06/15/17   Antoine Poche, MD  nitroGLYCERIN (NITROSTAT) 0.4 MG SL tablet Place 1 tablet (0.4 mg total) under the tongue every 5 (five)  minutes as needed for chest pain. 03/13/16   Strader, Lennart PallBrittany M, PA-C  predniSONE (DELTASONE) 20 MG tablet Take 3 po QD x 3d , then 2 po QD x 3d then 1 po QD x 3d 12/29/18   Devoria AlbeKnapp, Darion Milewski, MD    Family History History reviewed. No pertinent family history.  Social History Social History   Tobacco Use  . Smoking status: Current Every Day Smoker    Packs/day: 1.00  . Smokeless tobacco: Never Used  Substance Use Topics  . Alcohol use: Yes    Comment: weekly  . Drug use: No  On disability for bilateral hip replacement   Allergies   Ibuprofen   Review of  Systems Review of Systems  All other systems reviewed and are negative.    Physical Exam Updated Vital Signs BP (!) 176/82   Pulse 77   Temp 98 F (36.7 C)   Resp 17   Ht 5\' 9"  (1.753 m)   Wt 77 kg   SpO2 96%   BMI 25.07 kg/m   Vital signs normal except hypertension   Physical Exam Vitals signs and nursing note reviewed.  Constitutional:      General: He is not in acute distress.    Appearance: Normal appearance. He is well-developed. He is not ill-appearing or toxic-appearing.  HENT:     Head: Normocephalic and atraumatic.     Right Ear: External ear normal.     Left Ear: External ear normal.     Nose: Nose normal. No mucosal edema or rhinorrhea.     Mouth/Throat:     Mouth: Mucous membranes are moist.     Dentition: No dental abscesses.     Pharynx: No uvula swelling.  Eyes:     Extraocular Movements: Extraocular movements intact.     Conjunctiva/sclera: Conjunctivae normal.     Pupils: Pupils are equal, round, and reactive to light.  Neck:     Musculoskeletal: Full passive range of motion without pain, normal range of motion and neck supple.  Cardiovascular:     Rate and Rhythm: Normal rate and regular rhythm.     Heart sounds: Normal heart sounds. No murmur. No friction rub. No gallop.   Pulmonary:     Effort: Pulmonary effort is normal. No respiratory distress.     Breath sounds: Normal breath sounds. No wheezing, rhonchi or rales.  Chest:     Chest wall: No tenderness or crepitus.  Musculoskeletal: Normal range of motion.        General: No tenderness.     Comments: Moves all extremities well.   Skin:    General: Skin is warm and dry.     Coloration: Skin is not pale.     Findings: No erythema or rash.  Neurological:     General: No focal deficit present.     Mental Status: He is alert and oriented to person, place, and time.     Cranial Nerves: No cranial nerve deficit.     Comments: Patient has equal grips, there is no pronator drift.  He has no  observable motor weakness.  Psychiatric:        Mood and Affect: Mood normal. Mood is not anxious.        Speech: Speech normal.        Behavior: Behavior normal.        Thought Content: Thought content normal.      ED Treatments / Results  Labs (all labs ordered are listed, but only  abnormal results are displayed) Labs Reviewed - No data to display  EKG None  Radiology Dg Cervical Spine Complete  Result Date: 12/29/2018 CLINICAL DATA:  Left arm tingling.  No known injury. EXAM: CERVICAL SPINE - COMPLETE 4+ VIEW COMPARISON:  None. FINDINGS: Degenerative spurring anteriorly in the mid and lower cervical spine. Uncovertebral spurring causes mild left neural foraminal narrowing at C4-5 and C5-6. Prevertebral soft tissues are normal. No fracture or malalignment. IMPRESSION: Degenerative changes as above. Mild left neural foraminal narrowing at C4-5 and C5-6. No acute bony abnormality. Electronically Signed   By: Charlett NoseKevin  Dover M.D.   On: 12/29/2018 01:29   Ct Head Wo Contrast  Result Date: 12/29/2018 CLINICAL DATA:  Left hand tingling for several hours EXAM: CT HEAD WITHOUT CONTRAST TECHNIQUE: Contiguous axial images were obtained from the base of the skull through the vertex without intravenous contrast. COMPARISON:  None. FINDINGS: Brain: Cavum septum pellucidum is noted. No acute hemorrhage, acute infarction or space-occupying mass lesion is noted. Vascular: No hyperdense vessel or unexpected calcification. Skull: Normal. Negative for fracture or focal lesion. Sinuses/Orbits: No acute finding. Other: None. IMPRESSION: Normal head CT Electronically Signed   By: Alcide CleverMark  Lukens M.D.   On: 12/29/2018 01:41   Mr Brain Wo Contrast  Mr Cervical Spine Wo Contrast  Result Date: 12/29/2018 CLINICAL DATA:  Acute onset of tingling in the palm of the left hand. EXAM: MRI HEAD WITHOUT CONTRAST MRI CERVICAL SPINE WITHOUT CONTRAST TECHNIQUE: Multiplanar, multiecho pulse sequences of the brain and  surrounding structures, and cervical spine, to include the craniocervical junction and cervicothoracic junction, were obtained without intravenous contrast. COMPARISON:  Head CT and cervical spine radiographs 12/29/2018 FINDINGS: MRI HEAD FINDINGS Brain: There is no evidence of acute infarct, intracranial hemorrhage, mass, midline shift, or extra-axial fluid collection. The ventricles and sulci are normal in size for age. A cavum septum pellucidum et vergae is incidentally noted. No significant white matter disease is seen for age. Vascular: Major intracranial vascular flow voids are preserved. Skull and upper cervical spine: Unremarkable bone marrow signal. Sinuses/Orbits: Unremarkable orbits. Small right frontal sinus mucous retention cyst. Clear mastoid air cells. Other: None. MRI CERVICAL SPINE FINDINGS Alignment: Mild cervical spine straightening.  No listhesis. Vertebrae: No fracture or suspicious osseous lesion. Prominent asymmetric left-sided vertebral marrow edema at C5-6, likely degenerative. Focal type 2 degenerative marrow changes along the C4 inferior endplate. Cord: Normal signal. Posterior Fossa, vertebral arteries, paraspinal tissues: Unremarkable. Disc levels: The spinal canal is slightly small in caliber diffusely on a congenital basis. C2-3: Minimal uncovertebral spurring without stenosis. C3-4: A small central disc protrusion results in mild spinal stenosis. There is minimal uncovertebral spurring without significant neural foraminal stenosis. C4-5: Mild disc bulging and mild uncovertebral spurring result in mild spinal stenosis and mild bilateral neural foraminal stenosis. C5-6: Disc bulging and left greater than right uncovertebral spurring result in mild spinal stenosis and mild right and severe left neural foraminal stenosis with likely impingement of the left C6 nerve root. C6-7: Mild disc bulging and minimal uncovertebral spurring result in borderline right neural foraminal stenosis without  spinal stenosis. C7-T1: Minimal disc bulging without stenosis. IMPRESSION: 1. Unremarkable appearance of the brain for age. 2. Cervical disc degeneration greatest at C5-6 where there is degenerative marrow edema, mild spinal stenosis, and severe left neural foraminal stenosis with left C6 nerve root impingement. Electronically Signed   By: Sebastian AcheAllen  Grady M.D.   On: 12/29/2018 07:16    Procedures Procedures (including critical care time)  Medications  Ordered in ED Medications  predniSONE (DELTASONE) tablet 60 mg (has no administration in time range)     Initial Impression / Assessment and Plan / ED Course  I have reviewed the triage vital signs and the nursing notes.  Pertinent labs & imaging results that were available during my care of the patient were reviewed by me and considered in my medical decision making (see chart for details).   Cervical spine x-rays were done to look for any possible etiology of the abnormal sensation in his left hand, CT of the head was done to look for possible acute lacunar infarct.  Patient does have some degenerative changes in his cervical spine.  However able to speak to the tele-neurologist to see if we need to pursue doing anything else for possible stroke.  2:35 AM I spoke to the tele-neurologist Dr. Lewis Moccasin, he wants to examine the patient.  2:48 AM Dr. Lewis Moccasin has examined the patient.  He recommends patient gets an MRI of the brain and if that is normal do an MRI of the cervical spine.  If those are both normal he can be discharged.  He had suggested to the patient that he be admitted however the patient would prefer to stay in the ED and get the MRI scans done and hopefully discharge.  I asked Dr. Lewis Moccasin if it matters if we wait until our MRI scanner comes in at 6 this morning he states that would be okay.  I reviewed patient's MRI scans and he does appear to have some impingement of C6.  He was started on steroids.  He will be referred to neurosurgery.   Patient was cautioned about things to return to the ED such as the tingling extending up his arm, if he gets weakness or numbness in his arm or he seems worse.   Final Clinical Impressions(s) / ED Diagnoses   Final diagnoses:  Tingling of left upper extremity  Cervical radiculopathy    ED Discharge Orders         Ordered    predniSONE (DELTASONE) 20 MG tablet     12/29/18 0728         Plan discharge  Devoria Albe, MD, Concha Pyo, MD 12/29/18 [phone]

## 2018-12-29 NOTE — ED Notes (Signed)
Pt transported to MRI. Will complete Q2HR neuro exam when pt returns.

## 2018-12-29 NOTE — Consult Note (Signed)
   [facility] Tele[facility]urology Consult Services  Impression:  Stroke vs Cervical radiculopathy   Comments:   Stat  Recommendations:   MRI Brain without contrast.  MRI of the  C spine if brain is normal. ASA/Statin if no contraindications. IV fluids. Lipid Profile, HbA1C. Permissive HTN. Inpatient neurology consultation Inpatient stroke evaluation as per Neurology/ Internal Medicine Discussed with ED MD Please call with questions  -----------------------------------------------------------------------------------------  CC: left hand numbness  History of Present Illness:  Patient is a 58 YO M with h/o CAD and cardiomyopathy.  He states that he was laying on the couch watching TV about 10 PM and he noticed he was having some tingling and sticking sensation in the palm of his left hand.  He states that this comes and goes. Denies any other symptoms. No focal deficits. He denies any neck pain.    Diagnostic: CT Head: No acute Intracranial findings.  Exam: NIH Stroke Scale/Score (NIHSS)   RESULT SUMMARY: 0 points NIH Stroke Scale   INPUTS: 1A: Level of consciousness -> 0 = Alert; keenly responsive 1B: Ask month and age -> 0 = Both questions right 1C: 'Blink eyes' & 'squeeze hands' -> 0 = Performs both tasks 2: Horizontal extraocular movements -> 0 = Normal 3: Visual fields -> 0 = No visual loss 4: Facial palsy -> 0 = Normal symmetry 5A: Left arm motor drift -> 0 = No drift for 10 seconds 5B: Right arm motor drift -> 0 = No drift for 10 seconds 6A: Left leg motor drift -> 0 = No drift for 5 seconds 6B: Right leg motor drift -> 0 = No drift for 5 seconds 7: Limb Ataxia -> 0 = No ataxia 8: Sensation -> 0 = Normal; no sensory loss 9: Language/aphasia -> 0 = Normal; no aphasia 10: Dysarthria -> 0 = Normal 11: Extinction/inattention -> 0 = No abnormality   Medical Decision Making:  - Extensive number of diagnosis or management options are considered above.   -  Extensive amount of complex data reviewed.   - High risk of complication and/or morbidity or mortality are associated with differential diagnostic considerations above.  - There may be Uncertain outcome and increased probability of prolonged functional impairment or high probability of severe prolonged functional impairment associated with some of these differential diagnosis.  Medical Data Reviewed:  1.Data reviewed include clinical labs, radiology,  Medical Tests;   2.Tests results discussed w/performing or interpreting physician;   3.Obtaining/reviewing old medical records;  4.Obtaining case history from another source;  5.Independent review of image, tracing or specimen.    Patient was informed the neurology consult would happen via telehealth consult by way of interactive audio and video telecommunications and consented to receiving care in this manner.

## 2018-12-29 NOTE — Discharge Instructions (Signed)
Take the prednisone as prescribed.  Please call the neurosurgeons office to get a follow-up appointment.  Tell them you are having tingling in your left hand and your "MRI of your cervical spine shows you have a narrowing on the left C6 nerve root".  Return to the emergency room if you get weakness in your upper extremity, you get numbness in your left arm or the tingling starts spreading up your arm, or you seem worse in any way.

## 2019-01-03 DIAGNOSIS — M4802 Spinal stenosis, cervical region: Secondary | ICD-10-CM | POA: Diagnosis not present

## 2019-01-03 DIAGNOSIS — I5022 Chronic systolic (congestive) heart failure: Secondary | ICD-10-CM | POA: Diagnosis not present

## 2019-01-03 DIAGNOSIS — M5412 Radiculopathy, cervical region: Secondary | ICD-10-CM | POA: Diagnosis not present

## 2019-01-03 DIAGNOSIS — I2510 Atherosclerotic heart disease of native coronary artery without angina pectoris: Secondary | ICD-10-CM | POA: Diagnosis not present

## 2019-02-01 ENCOUNTER — Telehealth: Payer: Self-pay | Admitting: Internal Medicine

## 2019-02-01 ENCOUNTER — Encounter: Payer: Self-pay | Admitting: Internal Medicine

## 2019-02-01 ENCOUNTER — Ambulatory Visit: Payer: Medicare Other | Admitting: Nurse Practitioner

## 2019-02-01 NOTE — Progress Notes (Deleted)
Primary Care Physician:  Avon Gully, MD Primary Gastroenterologist:  Dr. Jena Gauss  No chief complaint on file.   HPI:   Tyler James is a 58 y.o. male who presents on referral from primary care to schedule colonoscopy.  Nurse/phone triage was deferred office visit due to alcohol consumption likely necessitating augmented sedation.  No history of colonoscopy in our system.  Today he states   Past Medical History:  Diagnosis Date  . CAD (coronary artery disease)    a. 02/2016: STEMI - 100% stenosis OM2 w/ DES placed, POBA to LCx, and staged PCI of 95% stenosed RCA w/ DES.  . Ischemic cardiomyopathy    a. 02/2016: EF 45-50% w/ diffuse inferolateral hypokinesis    Past Surgical History:  Procedure Laterality Date  . CARDIAC CATHETERIZATION N/A 03/10/2016   Procedure: Left Heart Cath and Coronary Angiography;  Surgeon: Peter M Swaziland, MD;  Location: [facility] INVASIVE CV LAB;  Service: Cardiovascular;  Laterality: N/A;  . CARDIAC CATHETERIZATION N/A 03/10/2016   Procedure: Coronary Stent Intervention;  Surgeon: Peter M Swaziland, MD;  Location: [facility] INVASIVE CV LAB;  Service: Cardiovascular;  Laterality: N/A;  . CARDIAC CATHETERIZATION N/A 03/12/2016   Procedure: Coronary Stent Intervention;  Surgeon: Kathleene Hazel, MD;  Location: [facility] INVASIVE CV LAB;  Service: Cardiovascular;  Laterality: N/A;  . TOTAL HIP ARTHROPLASTY     bilateral    Current Outpatient Medications  Medication Sig Dispense Refill  . aspirin 81 MG chewable tablet Chew 1 tablet (81 mg total) by mouth daily.    Marland Kitchen atorvastatin (LIPITOR) 40 MG tablet TAKE 1 TABLET BY MOUTH EVERY DAY 90 tablet 0  . cyclobenzaprine (FLEXERIL) 10 MG tablet Take 1 tablet (10 mg total) by mouth 3 (three) times daily. 20 tablet 0  . dexamethasone (DECADRON) 4 MG tablet Take 1 tablet (4 mg total) by mouth 2 (two) times daily with a meal. 10 tablet 0  . HYDROcodone-acetaminophen (NORCO/VICODIN) 5-325 MG tablet Take 1 tablet by mouth every 4  (four) hours as needed. 12 tablet 0  . losartan (COZAAR) 25 MG tablet Take 0.5 tablets (12.5 mg total) by mouth daily. 45 tablet 3  . methocarbamol (ROBAXIN) 750 MG tablet Take 1 tablet (750 mg total) by mouth 3 (three) times daily as needed (muscle spasm/pain). 15 tablet 0  . metoprolol tartrate (LOPRESSOR) 25 MG tablet TAKE 1/2 TABLET BY MOUTH TWICE DAILY 90 tablet 0  . nitroGLYCERIN (NITROSTAT) 0.4 MG SL tablet Place 1 tablet (0.4 mg total) under the tongue every 5 (five) minutes as needed for chest pain. 25 tablet 3  . predniSONE (DELTASONE) 20 MG tablet Take 3 po QD x 3d , then 2 po QD x 3d then 1 po QD x 3d 18 tablet 0   No current facility-administered medications for this visit.     Allergies as of 02/01/2019 - Review Complete 12/28/2018  Allergen Reaction Noted  . Ibuprofen Swelling 09/02/2011    No family history on file.  Social History   Socioeconomic History  . Marital status: Single    Spouse name: Not on file  . Number of children: Not on file  . Years of education: Not on file  . Highest education level: Not on file  Occupational History  . Occupation: currently unemployed.   Social Needs  . Financial resource strain: Not on file  . Food insecurity:    Worry: Not on file    Inability: Not on file  . Transportation needs:  Medical: Not on file    Non-medical: Not on file  Tobacco Use  . Smoking status: Current Every Day Smoker    Packs/day: 1.00  . Smokeless tobacco: Never Used  Substance and Sexual Activity  . Alcohol use: Yes    Comment: weekly  . Drug use: No  . Sexual activity: Yes  Lifestyle  . Physical activity:    Days per week: Not on file    Minutes per session: Not on file  . Stress: Not on file  Relationships  . Social connections:    Talks on phone: Not on file    Gets together: Not on file    Attends religious service: Not on file    Active member of club or organization: Not on file    Attends meetings of clubs or organizations:  Not on file    Relationship status: Not on file  . Intimate partner violence:    Fear of current or ex partner: Not on file    Emotionally abused: Not on file    Physically abused: Not on file    Forced sexual activity: Not on file  Other Topics Concern  . Not on file  Social History Narrative  . Not on file    Review of Systems: General: Negative for anorexia, weight loss, fever, chills, fatigue, weakness. Eyes: Negative for vision changes.  ENT: Negative for hoarseness, difficulty swallowing , nasal congestion. CV: Negative for chest pain, angina, palpitations, dyspnea on exertion, peripheral edema.  Respiratory: Negative for dyspnea at rest, dyspnea on exertion, cough, sputum, wheezing.  GI: See history of present illness. GU:  Negative for dysuria, hematuria, urinary incontinence, urinary frequency, nocturnal urination.  MS: Negative for joint pain, low back pain.  Derm: Negative for rash or itching.  Neuro: Negative for weakness, abnormal sensation, seizure, frequent headaches, memory loss, confusion.  Psych: Negative for anxiety, depression, suicidal ideation, hallucinations.  Endo: Negative for unusual weight change.  Heme: Negative for bruising or bleeding. Allergy: Negative for rash or hives.    Physical Exam: There were no vitals taken for this visit. General:   Alert and oriented. Pleasant and cooperative. Well-nourished and well-developed.  Head:  Normocephalic and atraumatic. Eyes:  Without icterus, sclera clear and conjunctiva pink.  Ears:  Normal auditory acuity. Mouth:  No deformity or lesions, oral mucosa pink.  Throat/Neck:  Supple, without mass or thyromegaly. Cardiovascular:  S1, S2 present without murmurs appreciated. Normal pulses noted. Extremities without clubbing or edema. Respiratory:  Clear to auscultation bilaterally. No wheezes, rales, or rhonchi. No distress.  Gastrointestinal:  +BS, soft, non-tender and non-distended. No HSM noted. No guarding  or rebound. No masses appreciated.  Rectal:  Deferred  Musculoskalatal:  Symmetrical without gross deformities. Normal posture. Skin:  Intact without significant lesions or rashes. Neurologic:  Alert and oriented x4;  grossly normal neurologically. Psych:  Alert and cooperative. Normal mood and affect. Heme/Lymph/Immune: No significant cervical adenopathy. No excessive bruising noted.    02/01/2019 7:48 AM   Disclaimer: This note was dictated with voice recognition software. Similar sounding words can inadvertently be transcribed and may not be corrected upon review.

## 2019-02-01 NOTE — Telephone Encounter (Signed)
PATIENT WAS A NO SHOW AND LETTER SENT  °

## 2019-05-30 DIAGNOSIS — E785 Hyperlipidemia, unspecified: Secondary | ICD-10-CM | POA: Diagnosis not present

## 2019-05-30 DIAGNOSIS — I5022 Chronic systolic (congestive) heart failure: Secondary | ICD-10-CM | POA: Diagnosis not present

## 2019-05-30 DIAGNOSIS — Z1389 Encounter for screening for other disorder: Secondary | ICD-10-CM | POA: Diagnosis not present

## 2019-05-30 DIAGNOSIS — Z0001 Encounter for general adult medical examination with abnormal findings: Secondary | ICD-10-CM | POA: Diagnosis not present

## 2019-05-30 DIAGNOSIS — I2510 Atherosclerotic heart disease of native coronary artery without angina pectoris: Secondary | ICD-10-CM | POA: Diagnosis not present

## 2019-08-07 DIAGNOSIS — I2510 Atherosclerotic heart disease of native coronary artery without angina pectoris: Secondary | ICD-10-CM | POA: Diagnosis not present

## 2019-08-07 DIAGNOSIS — I5022 Chronic systolic (congestive) heart failure: Secondary | ICD-10-CM | POA: Diagnosis not present

## 2019-09-06 DIAGNOSIS — I5022 Chronic systolic (congestive) heart failure: Secondary | ICD-10-CM | POA: Diagnosis not present

## 2019-09-06 DIAGNOSIS — I2510 Atherosclerotic heart disease of native coronary artery without angina pectoris: Secondary | ICD-10-CM | POA: Diagnosis not present

## 2019-10-07 DIAGNOSIS — I2510 Atherosclerotic heart disease of native coronary artery without angina pectoris: Secondary | ICD-10-CM | POA: Diagnosis not present

## 2019-10-07 DIAGNOSIS — E785 Hyperlipidemia, unspecified: Secondary | ICD-10-CM | POA: Diagnosis not present

## 2019-11-07 DIAGNOSIS — E785 Hyperlipidemia, unspecified: Secondary | ICD-10-CM | POA: Diagnosis not present

## 2019-11-07 DIAGNOSIS — I5022 Chronic systolic (congestive) heart failure: Secondary | ICD-10-CM | POA: Diagnosis not present

## 2019-11-07 DIAGNOSIS — I2510 Atherosclerotic heart disease of native coronary artery without angina pectoris: Secondary | ICD-10-CM | POA: Diagnosis not present

## 2019-11-07 DIAGNOSIS — M25519 Pain in unspecified shoulder: Secondary | ICD-10-CM | POA: Diagnosis not present

## 2019-12-08 DIAGNOSIS — I2510 Atherosclerotic heart disease of native coronary artery without angina pectoris: Secondary | ICD-10-CM | POA: Diagnosis not present

## 2019-12-08 DIAGNOSIS — M25421 Effusion, right elbow: Secondary | ICD-10-CM | POA: Diagnosis not present

## 2019-12-08 DIAGNOSIS — I5022 Chronic systolic (congestive) heart failure: Secondary | ICD-10-CM | POA: Diagnosis not present

## 2019-12-09 ENCOUNTER — Telehealth: Payer: Self-pay

## 2019-12-09 NOTE — Telephone Encounter (Signed)
Virtual Visit Pre-Appointment Phone Call  "(Name), I am calling you today to discuss your upcoming appointment. We are currently trying to limit exposure to the virus that causes COVID-19 by seeing patients at home rather than in the office."  "What is the BEST phone number to call the day of the visit?" - include this in appointment notes  "Do you have or have access to (through a family member/friend) a smartphone with video capability that we can use for your visit?" If yes - list this number in appt notes as "cell" (if different from BEST phone #) and list the appointment type as a VIDEO visit in appointment notes If no - list the appointment type as a PHONE visit in appointment notes  Confirm consent - "In the setting of the current Covid19 crisis, you are scheduled for a (phone or video) visit with your provider on (date) at (time).  Just as we do with many in-office visits, in order for you to participate in this visit, we must obtain consent.  If you'd like, I can send this to your mychart (if signed up) or email for you to review.  Otherwise, I can obtain your verbal consent now.  All virtual visits are billed to your insurance company just like a normal visit would be.  By agreeing to a virtual visit, we'd like you to understand that the technology does not allow for your provider to perform an examination, and thus may limit your provider's ability to fully assess your condition. If your provider identifies any concerns that need to be evaluated in person, we will make arrangements to do so.  Finally, though the technology is pretty good, we cannot assure that it will always work on either your or our end, and in the setting of a video visit, we may have to convert it to a phone-only visit.  In either situation, we cannot ensure that we have a secure connection.  Are you willing to proceed?" STAFF: Did the patient verbally acknowledge consent to telehealth visit? Document YES/NO here:    Advise patient to be prepared - "Two hours prior to your appointment, go ahead and check your blood pressure, pulse, oxygen saturation, and your weight (if you have the equipment to check those) and write them all down. When your visit starts, your provider will ask you for this information. If you have an Apple Watch or Kardia device, please plan to have heart rate information ready on the day of your appointment. Please have a pen and paper handy nearby the day of the visit as well."  Give patient instructions for MyChart download to smartphone OR Doximity/Doxy.me as below if video visit (depending on what platform provider is using)  Inform patient they will receive a phone call 15 minutes prior to their appointment time (may be from unknown caller ID) so they should be prepared to answer    [facility] has been deemed a candidate for a follow-up tele-health visit to limit community exposure during the Covid-19 pandemic. I spoke with the patient via phone to ensure availability of phone/video source, confirm preferred email & phone number, and discuss instructions and expectations.  I reminded Tyler James to be prepared with any vital sign and/or heart rhythm information that could potentially be obtained via home monitoring, at the time of his visit. I reminded Tyler James to expect a phone call prior to his visit.  Dorothey Baseman 12/09/2019 2:28 PM  INSTRUCTIONS FOR DOWNLOADING THE MYCHART APP TO SMARTPHONE  - The patient must first make sure to have activated MyChart and know their login information - If Apple, go to [employer] and type in MyChart in the search bar and download the app. If Android, ask patient to go to [employer] and type in [place] in the search bar and download the app. The app is free but as with any other app downloads, their phone may require them to verify saved payment information or Apple/Android password.  - The patient will need  to then log into the app with their MyChart username and password, and select [facility] as their healthcare provider to link the account. When it is time for your visit, go to the MyChart app, find appointments, and click Begin Video Visit. Be sure to Select Allow for your device to access the Microphone and Camera for your visit. You will then be connected, and your provider will be with you shortly.  **If they have any issues connecting, or need assistance please contact MyChart service desk [phone])**  **If using a computer, in order to ensure the best quality for their visit they will need to use either of the following Internet Browsers: [employer] or Google Chrome**  IF USING DOXIMITY or DOXY.ME - The patient will receive a link just prior to their visit by text.     FULL LENGTH CONSENT FOR TELE-HEALTH VISIT   I hereby voluntarily request, consent and authorize [facility] and its employed or contracted physicians, physician assistants, nurse practitioners or other licensed health care professionals (the Practitioner), to provide me with telemedicine health care services (the "Services") as deemed necessary by the treating Practitioner. I acknowledge and consent to receive the Services by the Practitioner via telemedicine. I understand that the telemedicine visit will involve communicating with the Practitioner through live audiovisual communication technology and the disclosure of certain medical information by electronic transmission. I acknowledge that I have been given the opportunity to request an in-person assessment or other available alternative prior to the telemedicine visit and am voluntarily participating in the telemedicine visit.  I understand that I have the right to withhold or withdraw my consent to the use of telemedicine in the course of my care at any time, without affecting my right to future care or treatment, and that the Practitioner or I may  terminate the telemedicine visit at any time. I understand that I have the right to inspect all information obtained and/or recorded in the course of the telemedicine visit and may receive copies of available information for a reasonable fee.  I understand that some of the potential risks of receiving the Services via telemedicine include:  Delay or interruption in medical evaluation due to technological equipment failure or disruption; Information transmitted may not be sufficient (e.g. poor resolution of images) to allow for appropriate medical decision making by the Practitioner; and/or  In rare instances, security protocols could fail, causing a breach of personal health information.  Furthermore, I acknowledge that it is my responsibility to provide information about my medical history, conditions and care that is complete and accurate to the best of my ability. I acknowledge that Practitioner's advice, recommendations, and/or decision may be based on factors not within their control, such as incomplete or inaccurate data provided by me or distortions of diagnostic images or specimens that may result from electronic transmissions. I understand that the practice of medicine is not an exact science and that Practitioner  makes no warranties or guarantees regarding treatment outcomes. I acknowledge that I will receive a copy of this consent concurrently upon execution via email to the email address I last provided but may also request a printed copy by calling the office of [facility].    I understand that my insurance will be billed for this visit.   I have read or had this consent read to me. I understand the contents of this consent, which adequately explains the benefits and risks of the Services being provided via telemedicine.  I have been provided ample opportunity to ask questions regarding this consent and the Services and have had my questions answered to my satisfaction. I give my  informed consent for the services to be provided through the use of telemedicine in my medical care  By participating in this telemedicine visit I agree to the above.

## 2019-12-27 ENCOUNTER — Telehealth: Payer: Medicare Other | Admitting: Cardiology

## 2019-12-29 ENCOUNTER — Encounter: Payer: Self-pay | Admitting: Cardiology

## 2019-12-29 ENCOUNTER — Telehealth (INDEPENDENT_AMBULATORY_CARE_PROVIDER_SITE_OTHER): Payer: Medicare Other | Admitting: Cardiology

## 2019-12-29 VITALS — Ht 69.0 in | Wt 170.0 lb

## 2019-12-29 DIAGNOSIS — E782 Mixed hyperlipidemia: Secondary | ICD-10-CM

## 2019-12-29 DIAGNOSIS — I2510 Atherosclerotic heart disease of native coronary artery without angina pectoris: Secondary | ICD-10-CM | POA: Diagnosis not present

## 2019-12-29 NOTE — Patient Instructions (Signed)

## 2019-12-29 NOTE — Progress Notes (Signed)
Virtual Visit via Telephone Note   This visit type was conducted due to national recommendations for restrictions regarding the COVID-19 Pandemic (e.g. social distancing) in an effort to limit this patient's exposure and mitigate transmission in our community.  Due to his co-morbid illnesses, this patient is at least at moderate risk for complications without adequate follow up.  This format is felt to be most appropriate for this patient at this time.  The patient did not have access to video technology/had technical difficulties with video requiring transitioning to audio format only (telephone).  All issues noted in this document were discussed and addressed.  No physical exam could be performed with this format.  Please refer to the patient's chart for his  consent to telehealth for [facility].   Date:  12/29/2019   ID:  Tyler James, DOB April 30, 1961, MRN [id number]  Patient Location: Home Provider Location: Office  PCP:  Avon Gully, MD  Cardiologist:  Dr Dina Rich MD Electrophysiologist:  None   Evaluation Performed:  Follow-Up Visit  Chief Complaint:  Follow up   History of Present Illness:    Tyler James is a 59 y.o. male seen today for follow up of the following medical problems.   1. CAD - NSTEMI 02/2016, received DES to OM2 and DES to RCA.  - echo 02/2016 LVEF 45-50% - unclear if possible facial rash on lisinopril - has been on losartan and tolerating.    - no recent chest pain - no SOB or DOE - compliant with meds    2. Hyperlipidemia - higher dose lipitor caused GI upset. Has tolerated atorvastatin 40mg  daily.  - remains compliant with statin - labs followed by pcp   SH: he is on disability  The patient does not have symptoms concerning for COVID-19 infection (fever, chills, cough, or new shortness of breath).    Past Medical History:  Diagnosis Date  . CAD (coronary artery disease)    a. 02/2016: STEMI - 100% stenosis OM2 w/ DES placed,  POBA to LCx, and staged PCI of 95% stenosed RCA w/ DES.  . Ischemic cardiomyopathy    a. 02/2016: EF 45-50% w/ diffuse inferolateral hypokinesis   Past Surgical History:  Procedure Laterality Date  . CARDIAC CATHETERIZATION N/A 03/10/2016   Procedure: Left Heart Cath and Coronary Angiography;  Surgeon: Peter M 03/12/2016, MD;  Location: [facility] INVASIVE CV LAB;  Service: Cardiovascular;  Laterality: N/A;  . CARDIAC CATHETERIZATION N/A 03/10/2016   Procedure: Coronary Stent Intervention;  Surgeon: Peter M 03/12/2016, MD;  Location: [facility] INVASIVE CV LAB;  Service: Cardiovascular;  Laterality: N/A;  . CARDIAC CATHETERIZATION N/A 03/12/2016   Procedure: Coronary Stent Intervention;  Surgeon: 03/14/2016, MD;  Location: [facility] INVASIVE CV LAB;  Service: Cardiovascular;  Laterality: N/A;  . TOTAL HIP ARTHROPLASTY     bilateral     Current Meds  Medication Sig  . aspirin 81 MG chewable tablet Chew 1 tablet (81 mg total) by mouth daily.  Kathleene Hazel atorvastatin (LIPITOR) 40 MG tablet TAKE 1 TABLET BY MOUTH EVERY DAY  . cyclobenzaprine (FLEXERIL) 10 MG tablet Take 1 tablet (10 mg total) by mouth 3 (three) times daily.  Marland Kitchen dexamethasone (DECADRON) 4 MG tablet Take 1 tablet (4 mg total) by mouth 2 (two) times daily with a meal.  . HYDROcodone-acetaminophen (NORCO/VICODIN) 5-325 MG tablet Take 1 tablet by mouth every 4 (four) hours as needed.  . methocarbamol (ROBAXIN) 750 MG tablet Take 1 tablet (750 mg total) by mouth  3 (three) times daily as needed (muscle spasm/pain).  . metoprolol tartrate (LOPRESSOR) 25 MG tablet TAKE 1/2 TABLET BY MOUTH TWICE DAILY  . nitroGLYCERIN (NITROSTAT) 0.4 MG SL tablet Place 1 tablet (0.4 mg total) under the tongue every 5 (five) minutes as needed for chest pain.  . predniSONE (DELTASONE) 20 MG tablet Take 3 po QD x 3d , then 2 po QD x 3d then 1 po QD x 3d     Allergies:   Ibuprofen   Social History   Tobacco Use  . Smoking status: Current Every Day Smoker    Packs/day: 1.00  .  Smokeless tobacco: Never Used  Substance Use Topics  . Alcohol use: Yes    Comment: weekly  . Drug use: No     Family Hx: The patient's family history is not on file.  ROS:   Please see the history of present illness.     All other systems reviewed and are negative.   Prior CV studies:   The following studies were reviewed today:  02/2016 Cath  Ost 1st Mrg lesion, 90% stenosed.  Prox LAD lesion, 30% stenosed.  Mid RCA lesion, 95% stenosed.  Ost 2nd Mrg to 2nd Mrg lesion, 100% stenosed. Post intervention, there is a 0% residual stenosis.  Prox Cx lesion, 50% stenosed. Post intervention, there is a 10% residual stenosis.  1. 2 vessel obstructive CAD. The culprit lesion is occlusion of a large OM2 Tyler James. There is severe mid RCA stenosis. The first OM is very small. 2. Successful stenting of the second OM with DES. POBA of the distal LCx through the stent struts.  Plan: DAPT for one year. Recommend PCI of the RCA prior to DC. Will assess LV function with Echo.  02/2016 Cath  Prox RCA lesion, 99% stenosed. Post intervention, there is a 0% residual stenosis.  1. Severe stenosis mid RCA 2. Successful PTCA/DES x 1 mid RCA  Recommendations: Will continue DAPT with ASA and Brilinta. Continue statin and beta blocker.   02/2016 echo Study Conclusions  - Left ventricle: The cavity size was normal. Wall thickness was normal. Systolic function was mildly reduced. The estimated ejection fraction was in the range of 45% to 50%. There is hypokinesis of the inferolateral myocardium. Left ventricular diastolic function parameters were normal. - Tricuspid valve: There was mild-moderate regurgitation. - Pulmonary arteries: Systolic pressure was mildly increased. PA peak pressure: 42 mm Hg (S).  Impressions:  - Hypokinesis of the inferior lateral wall with overall mildly reduced LV function; redundant MV chordae; trace MR; mild to moderate TR; mildly  elevated pulmonary pressure.  Labs/Other Tests and Data Reviewed:    EKG:  No ECG reviewed.  Recent Labs: No results found for requested labs within last 8760 hours.   Recent Lipid Panel Lab Results  Component Value Date/Time   CHOL 144 03/13/2016 02:14 AM   TRIG 149 03/13/2016 02:14 AM   HDL 45 03/13/2016 02:14 AM   CHOLHDL 3.2 03/13/2016 02:14 AM   LDLCALC 69 03/13/2016 02:14 AM    Wt Readings from Last 3 Encounters:  12/29/19 170 lb (77.1 kg)  12/28/18 169 lb 12.1 oz (77 kg)  12/24/18 170 lb (77.1 kg)     Objective:    Vital Signs:  Ht 5\' 9"  (1.753 m)   Wt 170 lb (77.1 kg)   BMI 25.10 kg/m    Normal affect. Normal speech pattern and tone. Comfortable, no apparent distress. No audible signs of SOB or wheezing.   ASSESSMENT &  PLAN:     1. CAD - no recent symptoms -continue current meds  2. Hyperlipidemia - did not tolerate higher dose atorva, doing well on 40mg  daily - request pcp labs, continue current statin   Counseled on smoking cessation.   COVID-19 Education: The signs and symptoms of COVID-19 were discussed with the patient and how to seek care for testing (follow up with PCP or arrange E-visit).  The importance of social distancing was discussed today.  Time:   Today, I have spent 22 minutes with the patient with telehealth technology discussing the above problems.     Medication Adjustments/Labs and Tests Ordered: Current medicines are reviewed at length with the patient today.  Concerns regarding medicines are outlined above.   Tests Ordered: No orders of the defined types were placed in this encounter.   Medication Changes: No orders of the defined types were placed in this encounter.   Follow Up:  In Person in 1 year(s)  Signed, Carlyle Dolly, MD  12/29/2019 12:54 PM    [place]

## 2019-12-30 ENCOUNTER — Telehealth: Payer: Self-pay | Admitting: Licensed Clinical Social Worker

## 2019-12-30 NOTE — Telephone Encounter (Signed)
CSW referred to assist patient with obtaining a BP cuff. CSW contacted patient to inform cuff will be delivered to home. Patient grateful for support and assistance. CSW available as needed. Jackie Abass Misener, LCSW, CCSW-MCS [phone]  

## 2020-01-08 DIAGNOSIS — I2510 Atherosclerotic heart disease of native coronary artery without angina pectoris: Secondary | ICD-10-CM | POA: Diagnosis not present

## 2020-01-08 DIAGNOSIS — E785 Hyperlipidemia, unspecified: Secondary | ICD-10-CM | POA: Diagnosis not present

## 2020-02-05 DIAGNOSIS — I2510 Atherosclerotic heart disease of native coronary artery without angina pectoris: Secondary | ICD-10-CM | POA: Diagnosis not present

## 2020-02-05 DIAGNOSIS — E785 Hyperlipidemia, unspecified: Secondary | ICD-10-CM | POA: Diagnosis not present

## 2020-02-28 DIAGNOSIS — I2510 Atherosclerotic heart disease of native coronary artery without angina pectoris: Secondary | ICD-10-CM | POA: Diagnosis not present

## 2020-02-28 DIAGNOSIS — I5022 Chronic systolic (congestive) heart failure: Secondary | ICD-10-CM | POA: Diagnosis not present

## 2020-02-28 DIAGNOSIS — E785 Hyperlipidemia, unspecified: Secondary | ICD-10-CM | POA: Diagnosis not present

## 2020-03-29 DIAGNOSIS — I2510 Atherosclerotic heart disease of native coronary artery without angina pectoris: Secondary | ICD-10-CM | POA: Diagnosis not present

## 2020-03-29 DIAGNOSIS — E785 Hyperlipidemia, unspecified: Secondary | ICD-10-CM | POA: Diagnosis not present

## 2020-04-29 DIAGNOSIS — I5022 Chronic systolic (congestive) heart failure: Secondary | ICD-10-CM | POA: Diagnosis not present

## 2020-04-29 DIAGNOSIS — E785 Hyperlipidemia, unspecified: Secondary | ICD-10-CM | POA: Diagnosis not present

## 2020-05-29 DIAGNOSIS — E785 Hyperlipidemia, unspecified: Secondary | ICD-10-CM | POA: Diagnosis not present

## 2020-05-29 DIAGNOSIS — I2510 Atherosclerotic heart disease of native coronary artery without angina pectoris: Secondary | ICD-10-CM | POA: Diagnosis not present

## 2020-06-29 DIAGNOSIS — I5022 Chronic systolic (congestive) heart failure: Secondary | ICD-10-CM | POA: Diagnosis not present

## 2020-06-29 DIAGNOSIS — M4802 Spinal stenosis, cervical region: Secondary | ICD-10-CM | POA: Diagnosis not present

## 2020-07-30 DIAGNOSIS — E785 Hyperlipidemia, unspecified: Secondary | ICD-10-CM | POA: Diagnosis not present

## 2020-07-30 DIAGNOSIS — I5022 Chronic systolic (congestive) heart failure: Secondary | ICD-10-CM | POA: Diagnosis not present

## 2020-08-28 IMAGING — MR MR CERVICAL SPINE W/O CM
8 of 15 series · 28 of 48 positions shown · non-contrast
Comparison: Head CT and cervical spine radiographs 12/29/2018

CLINICAL DATA: Acute onset of tingling in the palm of the left
hand.

EXAM:
MRI HEAD WITHOUT CONTRAST
MRI CERVICAL SPINE WITHOUT CONTRAST
TECHNIQUE: Multiplanar, multiecho pulse sequences of the brain and surrounding
structures, and cervical spine, to include the craniocervical
junction and cervicothoracic junction, were obtained without
intravenous contrast.

[Series 2: [id number]_sag · sagittal · 5.0mm · 0.42mm/px · 3 of 20 slices shown]
[im 1/20]
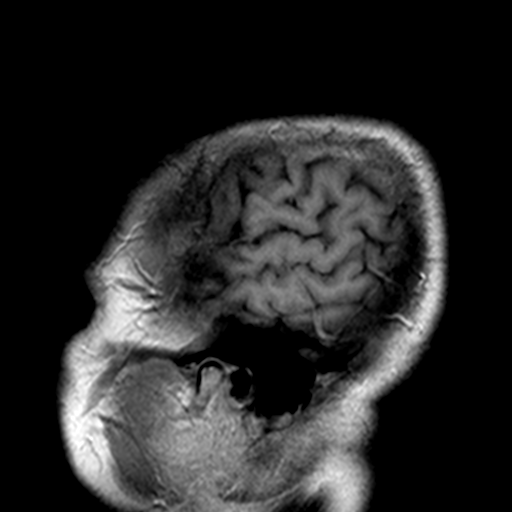
[im 10/20]
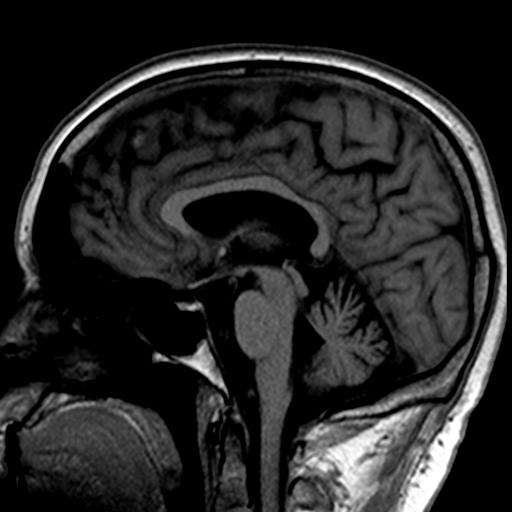
[im 20/20]
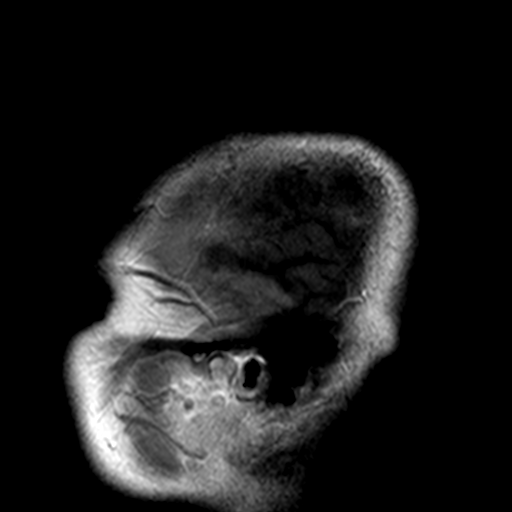

[Series 3: DWI · axial · 3.0mm · 0.77mm/px · z∈[+7,+167]mm · 5 of 55 slices shown (1 of 2)]
[im 1/55]
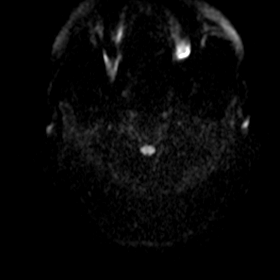
[im 14/55]
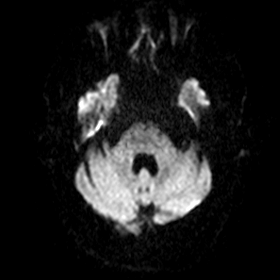
[im 28/55]
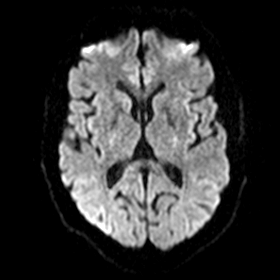
[im 41/55]
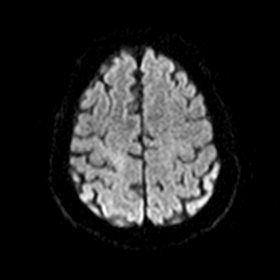
[im 55/55]
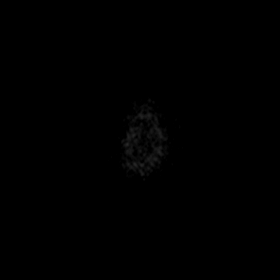

[Series 4: DWI · axial · 3.0mm · 0.77mm/px · z∈[+7,+87]mm · 3 of 55 slices shown (2 of 2)]
[im 1/55]
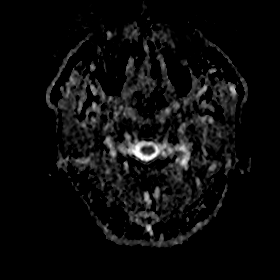
[im 14/55]
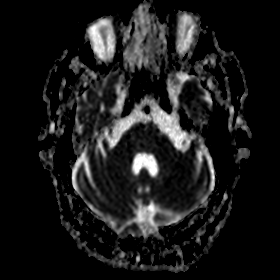
[im 28/55]
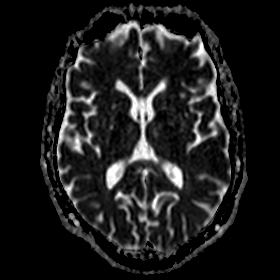

[Series 7: T2 · axial · 5.0mm · 0.68mm/px · z∈[+18,+158]mm · 2 of 23 slices shown (1 of 4)]
[im 1/23]
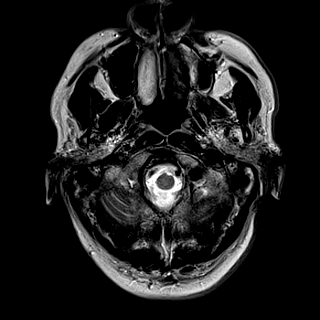
[im 23/23]
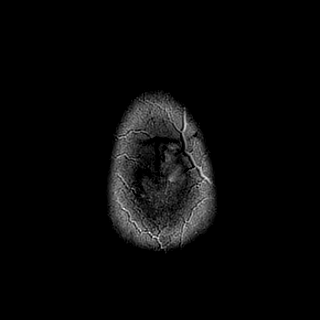

[Series 10: T1 · axial · 2.0mm · 0.42mm/px · z∈[+6,+187]mm · 8 of 93 slices shown]
[im 1/93]
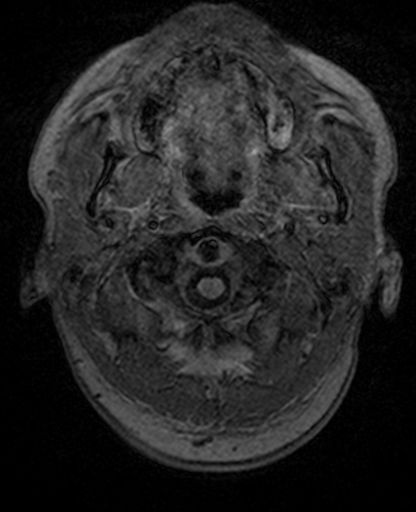
[im 12/93]
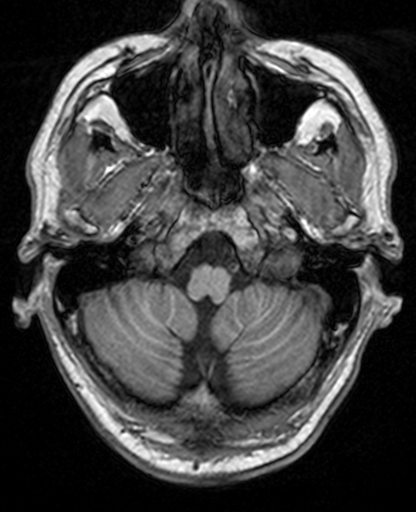
[im 24/93]
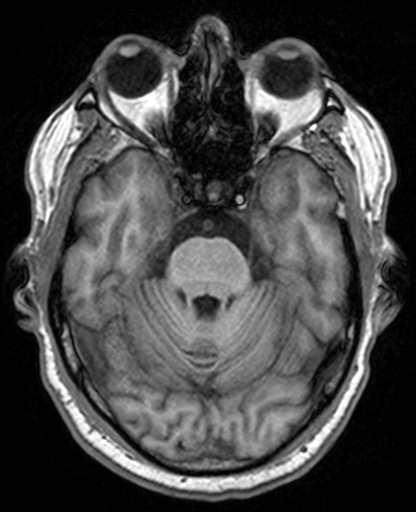
[im 35/93]
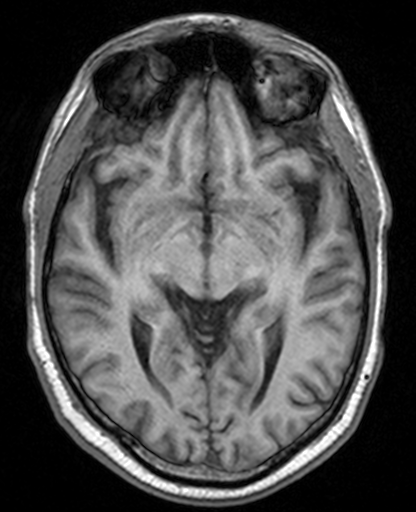
[im 58/93]
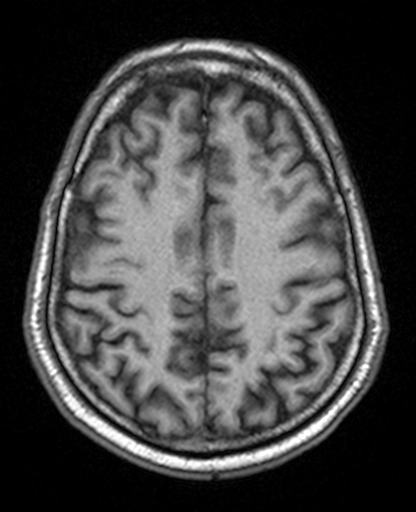
[im 70/93]
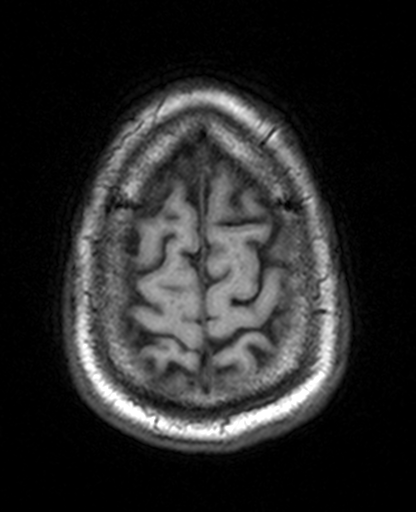
[im 81/93]
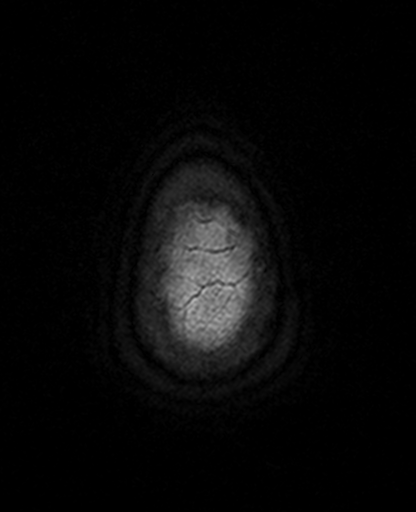
[im 93/93]
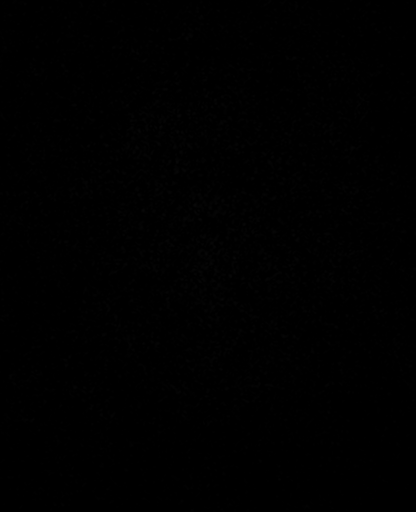

[Series 12: T2 · coronal · 5.0mm · 0.59mm/px · 3 of 28 slices shown (2 of 4)]
[im 1/28]
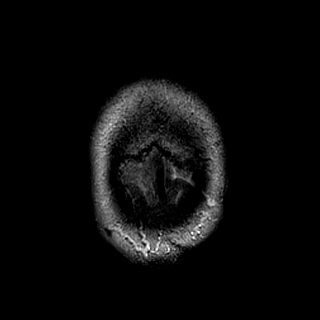
[im 14/28]
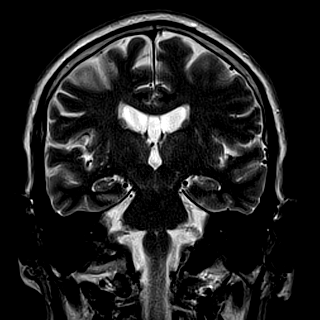
[im 28/28]
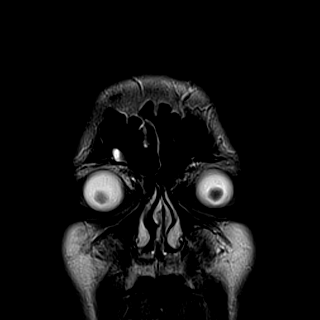

[Series 15: T2 · sagittal · 3.0mm · 0.38mm/px · 1 of 13 slices shown (3 of 4)]
[im 1/13]
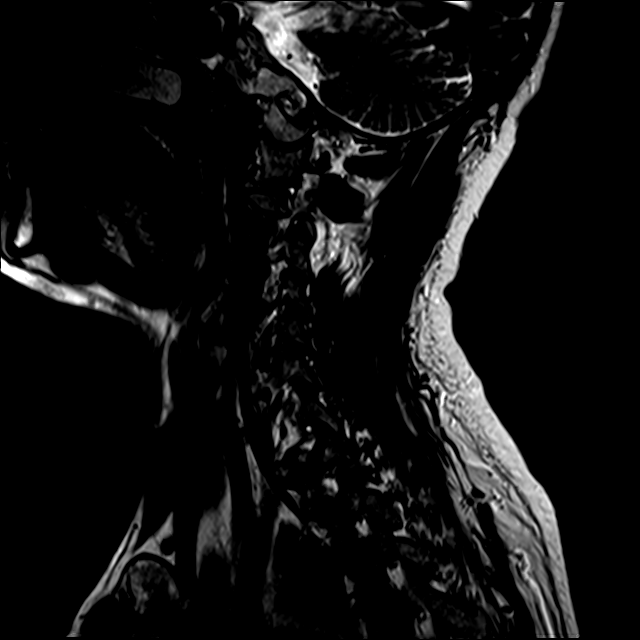

[Series 18: T2 · axial · 3.0mm · 0.22mm/px · z∈[-50,+53]mm · 3 of 33 slices shown (4 of 4)]
[im 1/33]
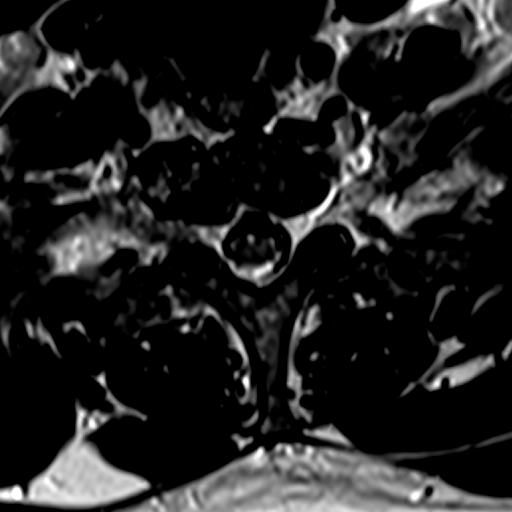
[im 17/33]
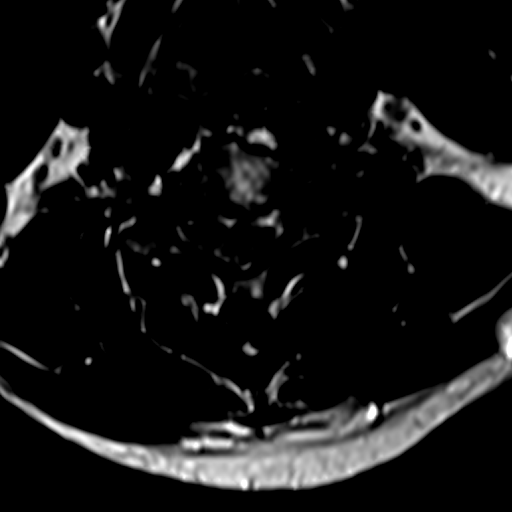
[im 33/33]
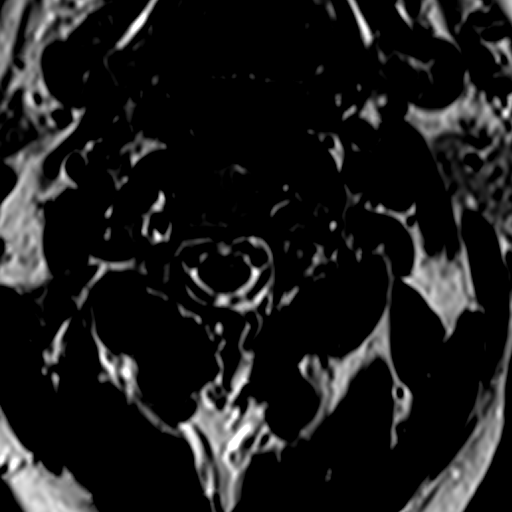

[28 of 48 positions shown; findings below may reference images not displayed]

FINDINGS: MRI HEAD FINDINGS

Brain: There is no evidence of acute infarct, intracranial
hemorrhage, mass, midline shift, or extra-axial fluid collection.
The ventricles and sulci are normal in size for age. A cavum septum
pellucidum et vergae is incidentally noted. No significant white
matter disease is seen for age.

Vascular: Major intracranial vascular flow voids are preserved.

Skull and upper cervical spine: Unremarkable bone marrow signal.

Sinuses/Orbits: Unremarkable orbits. Small right frontal sinus
mucous retention cyst. Clear mastoid air cells.

Other: None.

MRI CERVICAL SPINE FINDINGS

Alignment: Mild cervical spine straightening.  No listhesis.

Vertebrae: No fracture or suspicious osseous lesion. Prominent
asymmetric left-sided vertebral marrow edema at C5-6, likely
degenerative. Focal type 2 degenerative marrow changes along the C4
inferior endplate.

Cord: Normal signal.

Posterior Fossa, vertebral arteries, paraspinal tissues:
Unremarkable.

Disc levels: The spinal canal is slightly small in caliber diffusely
on a congenital basis.

C2-3: Minimal uncovertebral spurring without stenosis.

C3-4: A small central disc protrusion results in mild spinal
stenosis. There is minimal uncovertebral spurring without
significant neural foraminal stenosis.

C4-5: Mild disc bulging and mild uncovertebral spurring result in
mild spinal stenosis and mild bilateral neural foraminal stenosis.

C5-6: Disc bulging and left greater than right uncovertebral
spurring result in mild spinal stenosis and mild right and severe
left neural foraminal stenosis with likely impingement of the left
C6 nerve root.

C6-7: Mild disc bulging and minimal uncovertebral spurring result in
borderline right neural foraminal stenosis without spinal stenosis.

C7-T1: Minimal disc bulging without stenosis.
IMPRESSION: 1. Unremarkable appearance of the brain for age.
2. Cervical disc degeneration greatest at C5-6 where there is
degenerative marrow edema, mild spinal stenosis, and severe left
neural foraminal stenosis with left C6 nerve root impingement.

## 2020-09-16 DIAGNOSIS — I2510 Atherosclerotic heart disease of native coronary artery without angina pectoris: Secondary | ICD-10-CM | POA: Diagnosis not present

## 2020-09-16 DIAGNOSIS — E785 Hyperlipidemia, unspecified: Secondary | ICD-10-CM | POA: Diagnosis not present

## 2020-10-01 DIAGNOSIS — F17210 Nicotine dependence, cigarettes, uncomplicated: Secondary | ICD-10-CM | POA: Diagnosis not present

## 2020-10-01 DIAGNOSIS — E785 Hyperlipidemia, unspecified: Secondary | ICD-10-CM | POA: Diagnosis not present

## 2020-10-01 DIAGNOSIS — I5022 Chronic systolic (congestive) heart failure: Secondary | ICD-10-CM | POA: Diagnosis not present

## 2020-10-01 DIAGNOSIS — Z1389 Encounter for screening for other disorder: Secondary | ICD-10-CM | POA: Diagnosis not present

## 2020-10-01 DIAGNOSIS — Z0001 Encounter for general adult medical examination with abnormal findings: Secondary | ICD-10-CM | POA: Diagnosis not present

## 2020-10-01 DIAGNOSIS — I255 Ischemic cardiomyopathy: Secondary | ICD-10-CM | POA: Diagnosis not present

## 2020-10-31 DIAGNOSIS — I5022 Chronic systolic (congestive) heart failure: Secondary | ICD-10-CM | POA: Diagnosis not present

## 2020-10-31 DIAGNOSIS — E785 Hyperlipidemia, unspecified: Secondary | ICD-10-CM | POA: Diagnosis not present

## 2020-12-03 DIAGNOSIS — I2510 Atherosclerotic heart disease of native coronary artery without angina pectoris: Secondary | ICD-10-CM | POA: Diagnosis not present

## 2020-12-03 DIAGNOSIS — E785 Hyperlipidemia, unspecified: Secondary | ICD-10-CM | POA: Diagnosis not present

## 2021-01-03 DIAGNOSIS — F17200 Nicotine dependence, unspecified, uncomplicated: Secondary | ICD-10-CM | POA: Diagnosis not present

## 2021-01-03 DIAGNOSIS — I2510 Atherosclerotic heart disease of native coronary artery without angina pectoris: Secondary | ICD-10-CM | POA: Diagnosis not present

## 2021-01-14 ENCOUNTER — Other Ambulatory Visit: Payer: Self-pay

## 2021-01-14 ENCOUNTER — Encounter (HOSPITAL_COMMUNITY): Payer: Self-pay | Admitting: Emergency Medicine

## 2021-01-14 ENCOUNTER — Emergency Department (HOSPITAL_COMMUNITY)
Admission: EM | Admit: 2021-01-14 | Discharge: 2021-01-14 | Disposition: A | Discharge status: Home / Self Care | Payer: Medicare Other | Attending: Emergency Medicine | Admitting: Emergency Medicine

## 2021-01-14 DIAGNOSIS — Z96643 Presence of artificial hip joint, bilateral: Secondary | ICD-10-CM | POA: Insufficient documentation

## 2021-01-14 DIAGNOSIS — R0981 Nasal congestion: Secondary | ICD-10-CM | POA: Insufficient documentation

## 2021-01-14 DIAGNOSIS — Z7982 Long term (current) use of aspirin: Secondary | ICD-10-CM | POA: Diagnosis not present

## 2021-01-14 DIAGNOSIS — R059 Cough, unspecified: Secondary | ICD-10-CM | POA: Insufficient documentation

## 2021-01-14 DIAGNOSIS — Z720 Tobacco use: Secondary | ICD-10-CM

## 2021-01-14 DIAGNOSIS — R519 Headache, unspecified: Secondary | ICD-10-CM | POA: Insufficient documentation

## 2021-01-14 DIAGNOSIS — I2510 Atherosclerotic heart disease of native coronary artery without angina pectoris: Secondary | ICD-10-CM | POA: Insufficient documentation

## 2021-01-14 DIAGNOSIS — Z20822 Contact with and (suspected) exposure to covid-19: Secondary | ICD-10-CM | POA: Insufficient documentation

## 2021-01-14 DIAGNOSIS — F17200 Nicotine dependence, unspecified, uncomplicated: Secondary | ICD-10-CM | POA: Insufficient documentation

## 2021-01-14 LAB — SARS CORONAVIRUS 2 (TAT 6-24 HRS): SARS Coronavirus 2: NEGATIVE

## 2021-01-14 MED ORDER — ALBUTEROL SULFATE HFA 108 (90 BASE) MCG/ACT IN AERS
2.0000 | INHALATION_SPRAY | Freq: Once | RESPIRATORY_TRACT | Status: AC
  Administered 2021-01-14: 2 via RESPIRATORY_TRACT
  Filled 2021-01-14: qty 6.7

## 2021-01-14 MED ORDER — SALINE SPRAY 0.65 % NA SOLN
1.0000 | NASAL | Status: DC | PRN
  Administered 2021-01-14: 1 via NASAL
  Filled 2021-01-14 (×2): qty 44

## 2021-01-14 MED ORDER — VARENICLINE TARTRATE 0.5 MG PO TABS: 0.5000 mg | ORAL_TABLET | Freq: Two times a day (BID) | ORAL | 0 refills | Status: DC

## 2021-01-14 NOTE — ED Triage Notes (Signed)
Pt c/o nasal congestion and slight cough since yesterday.

## 2021-01-14 NOTE — ED Provider Notes (Signed)
[facility] DE[facility]MENT Provider Note   CSN: [id number] Arrival date & time: 01/14/21  0158     History Chief Complaint  Patient presents with  . Nasal Congestion    [facility] is a 60 y.o. male.  The history is provided by the patient.  URI Presenting symptoms: congestion, cough and facial pain   Presenting symptoms: no ear pain, no fatigue, no fever and no sore throat   Severity:  Mild Onset quality:  Gradual Timing:  Constant Progression:  Worsening Chronicity:  New Relieved by:  None tried      Past Medical History:  Diagnosis Date  . CAD (coronary artery disease)    a. 02/2016: STEMI - 100% stenosis OM2 w/ DES placed, POBA to LCx, and staged PCI of 95% stenosed RCA w/ DES.  . Ischemic cardiomyopathy    a. 02/2016: EF 45-50% w/ diffuse inferolateral hypokinesis    Patient Active Problem List   Diagnosis Date Noted  . Unstable angina (HCC)   . ST elevation (STEMI) myocardial infarction involving left circumflex coronary artery (HCC) 03/10/2016  . Tobacco abuse 03/10/2016    Past Surgical History:  Procedure Laterality Date  . CARDIAC CATHETERIZATION N/A 03/10/2016   Procedure: Left Heart Cath and Coronary Angiography;  Surgeon: Peter M Swaziland, MD;  Location: [facility] INVASIVE CV LAB;  Service: Cardiovascular;  Laterality: N/A;  . CARDIAC CATHETERIZATION N/A 03/10/2016   Procedure: Coronary Stent Intervention;  Surgeon: Peter M Swaziland, MD;  Location: [facility] INVASIVE CV LAB;  Service: Cardiovascular;  Laterality: N/A;  . CARDIAC CATHETERIZATION N/A 03/12/2016   Procedure: Coronary Stent Intervention;  Surgeon: Kathleene Hazel, MD;  Location: [facility] INVASIVE CV LAB;  Service: Cardiovascular;  Laterality: N/A;  . TOTAL HIP ARTHROPLASTY     bilateral       History reviewed. No pertinent family history.  Social History   Tobacco Use  . Smoking status: Current Every Day Smoker    Packs/day: 1.00  . Smokeless tobacco: Never Used  Vaping Use  . Vaping  Use: Never used  Substance Use Topics  . Alcohol use: Yes    Comment: weekly  . Drug use: No    Home Medications Prior to Admission medications   Medication Sig Start Date End Date Taking? Authorizing Provider  varenicline (CHANTIX) 0.5 MG tablet Take 1 tablet (0.5 mg total) by mouth 2 (two) times daily. 01/14/21  Yes Mesner, Barbara Cower, MD  aspirin 81 MG chewable tablet Chew 1 tablet (81 mg total) by mouth daily. 03/13/16   Strader, Lennart Pall, PA-C  atorvastatin (LIPITOR) 40 MG tablet TAKE 1 TABLET BY MOUTH EVERY DAY 06/08/17   Antoine Poche, MD  cyclobenzaprine (FLEXERIL) 10 MG tablet Take 1 tablet (10 mg total) by mouth 3 (three) times daily. 08/16/18   Ivery Quale, PA-C  dexamethasone (DECADRON) 4 MG tablet Take 1 tablet (4 mg total) by mouth 2 (two) times daily with a meal. 08/16/18   Ivery Quale, PA-C  HYDROcodone-acetaminophen (NORCO/VICODIN) 5-325 MG tablet Take 1 tablet by mouth every 4 (four) hours as needed. 08/16/18   Ivery Quale, PA-C  losartan (COZAAR) 25 MG tablet Take 0.5 tablets (12.5 mg total) by mouth daily. 07/13/17 10/11/17  Antoine Poche, MD  methocarbamol (ROBAXIN) 750 MG tablet Take 1 tablet (750 mg total) by mouth 3 (three) times daily as needed (muscle spasm/pain). 12/24/18   Cathren Laine, MD  metoprolol tartrate (LOPRESSOR) 25 MG tablet TAKE 1/2 TABLET BY MOUTH TWICE DAILY 06/15/17   Branch,  Dorothe Pea, MD  nitroGLYCERIN (NITROSTAT) 0.4 MG SL tablet Place 1 tablet (0.4 mg total) under the tongue every 5 (five) minutes as needed for chest pain. 03/13/16   Strader, Lennart Pall, PA-C  predniSONE (DELTASONE) 20 MG tablet Take 3 po QD x 3d , then 2 po QD x 3d then 1 po QD x 3d 12/29/18   Devoria Albe, MD    Allergies    Ibuprofen  Review of Systems   Review of Systems  Constitutional: Negative for fatigue and fever.  HENT: Positive for congestion. Negative for ear pain and sore throat.   Respiratory: Positive for cough.   All other systems reviewed and are  negative.   Physical Exam Updated Vital Signs BP (!) 141/78   Pulse 79   Temp 97.9 F (36.6 C)   Resp 18   Ht 5\' 9"  (1.753 m)   Wt 74.8 kg   SpO2 97%   BMI 24.37 kg/m   Physical Exam Vitals and nursing note reviewed.  Constitutional:      Appearance: He is well-developed and well-nourished.  HENT:     Head: Normocephalic and atraumatic.     Nose: Mucosal edema and congestion present. No nasal deformity or signs of injury.     Mouth/Throat:     Mouth: Mucous membranes are moist.     Pharynx: Oropharynx is clear. No posterior oropharyngeal erythema.  Eyes:     Pupils: Pupils are equal, round, and reactive to light.  Cardiovascular:     Rate and Rhythm: Normal rate.  Pulmonary:     Effort: Pulmonary effort is normal. No respiratory distress.  Abdominal:     General: There is no distension.  Musculoskeletal:        General: No swelling or tenderness. Normal range of motion.     Cervical back: Normal range of motion.  Skin:    General: Skin is warm and dry.     Coloration: Skin is not jaundiced or pale.  Neurological:     General: No focal deficit present.     Mental Status: He is alert.     ED Results / Procedures / Treatments   Labs (all labs ordered are listed, but only abnormal results are displayed) Labs Reviewed  SARS CORONAVIRUS 2 (TAT 6-24 HRS)    EKG None  Radiology No results found.  Procedures Procedures  Counseled patient for approximately 7 minutes regarding smoking cessation. Discussed risks of smoking and how they applied and affected their visit here today. Patient is ready to quit at this time, and will follow up with their primary doctor to get medication rx continued next month.    CPT code: : intermediate counseling for smoking cessation   Medications Ordered in ED Medications  sodium chloride (OCEAN) 0.65 % nasal spray 1 spray (1 spray Each Nare Given 01/14/21 0313)  albuterol (VENTOLIN HFA) 108 (90 Base) MCG/ACT inhaler 2 puff  (2 puffs Inhalation Given 01/14/21 0241)    ED Course  I have reviewed the triage vital signs and the nursing notes.  Pertinent labs & imaging results that were available during my care of the patient were reviewed by me and considered in my medical decision making (see chart for details).    MDM Rules/Calculators/A&P                          Nasal congestion. Just started today. Unclear cause. No e/o bacterial infection. covid tested. Improved some with nasal  spray and albuterol, will continue same. Return here for new/worsening symptoms.   Final Clinical Impression(s) / ED Diagnoses Final diagnoses:  Nasal congestion  Smoking trying to quit    Rx / DC Orders ED Discharge Orders         Ordered    varenicline (CHANTIX) 0.5 MG tablet  2 times daily        01/14/21 0314           Mesner, Barbara Cower, MD 01/14/21 [phone]

## 2021-01-31 DIAGNOSIS — I2510 Atherosclerotic heart disease of native coronary artery without angina pectoris: Secondary | ICD-10-CM | POA: Diagnosis not present

## 2021-01-31 DIAGNOSIS — E785 Hyperlipidemia, unspecified: Secondary | ICD-10-CM | POA: Diagnosis not present

## 2021-02-14 DIAGNOSIS — Z0001 Encounter for general adult medical examination with abnormal findings: Secondary | ICD-10-CM | POA: Diagnosis not present

## 2021-02-14 DIAGNOSIS — Z79899 Other long term (current) drug therapy: Secondary | ICD-10-CM | POA: Diagnosis not present

## 2021-02-18 ENCOUNTER — Encounter: Payer: Self-pay | Admitting: Cardiology

## 2021-02-18 ENCOUNTER — Ambulatory Visit (INDEPENDENT_AMBULATORY_CARE_PROVIDER_SITE_OTHER): Payer: Medicare Other | Admitting: Cardiology

## 2021-02-18 ENCOUNTER — Other Ambulatory Visit: Payer: Self-pay

## 2021-02-18 VITALS — BP 140/60 | HR 72 | Ht 69.0 in | Wt 174.0 lb

## 2021-02-18 DIAGNOSIS — I2510 Atherosclerotic heart disease of native coronary artery without angina pectoris: Secondary | ICD-10-CM

## 2021-02-18 DIAGNOSIS — E782 Mixed hyperlipidemia: Secondary | ICD-10-CM | POA: Diagnosis not present

## 2021-02-18 DIAGNOSIS — I10 Essential (primary) hypertension: Secondary | ICD-10-CM

## 2021-02-18 MED ORDER — METOPROLOL TARTRATE 25 MG PO TABS: 25.0000 mg | ORAL_TABLET | Freq: Two times a day (BID) | ORAL | 3 refills | Status: DC

## 2021-02-18 NOTE — Addendum Note (Signed)
Addended by: Marlyn Corporal A on: 02/18/2021 02:13 PM   Modules accepted: Orders

## 2021-02-18 NOTE — Patient Instructions (Signed)
Medication Instructions:  INCREASE lopressor to 25 mg TWICE a day   *If you need a refill on your cardiac medications before your next appointment, please call your pharmacy*   Lab Work: None today If you have labs (blood work) drawn today and your tests are completely normal, you will receive your results only by: Marland Kitchen MyChart Message (if you have MyChart) OR . A paper copy in the mail If you have any lab test that is abnormal or we need to change your treatment, we will call you to review the results.   Testing/Procedures: None today   Follow-Up: At [facility], you and your health needs are our priority.  As part of our continuing mission to provide you with exceptional heart care, we have created designated Provider Care Teams.  These Care Teams include your primary Cardiologist (physician) and Advanced Practice Providers (APPs -  Physician Assistants and Nurse Practitioners) who all work together to provide you with the care you need, when you need it.  We recommend signing up for the patient portal called "MyChart".  Sign up information is provided on this After Visit Summary.  MyChart is used to connect with patients for Virtual Visits (Telemedicine).  Patients are able to view lab/test results, encounter notes, upcoming appointments, etc.  Non-urgent messages can be sent to your provider as well.   To learn more about what you can do with MyChart, go to [email or web address].    Your next appointment:   12 month(s)  The format for your next appointment:   In Person  Provider:   Dina Rich, MD   Other Instructions None

## 2021-02-18 NOTE — Progress Notes (Signed)
Clinical Summary [facility] is a 60 y.o.male seen today for follow up of the following medical problems.  1. CAD - NSTEMI 02/2016, received DES to OM2 and DES to RCA.  - echo 02/2016 LVEF 45-50% - unclear if possible facial rash on lisinopril - has been on losartan and tolerating.    - no recent chest pain. No SOB/DOE - compliant with meds    2. Hyperlipidemia - higher dose lipitor caused GI upset.Has tolerated atorvastatin 40mg  daily. - compliant with statin   3. HTN Compliant with meds  4. AAA screen - will need in 3 years.    SH: he is on disability   Past Medical History:  Diagnosis Date  . CAD (coronary artery disease)    a. 02/2016: STEMI - 100% stenosis OM2 w/ DES placed, POBA to LCx, and staged PCI of 95% stenosed RCA w/ DES.  . Ischemic cardiomyopathy    a. 02/2016: EF 45-50% w/ diffuse inferolateral hypokinesis     Allergies  Allergen Reactions  . Ibuprofen Swelling    Swelling of face     Current Outpatient Medications  Medication Sig Dispense Refill  . aspirin 81 MG chewable tablet Chew 1 tablet (81 mg total) by mouth daily.    03/2016 atorvastatin (LIPITOR) 40 MG tablet TAKE 1 TABLET BY MOUTH EVERY DAY 90 tablet 0  . cyclobenzaprine (FLEXERIL) 10 MG tablet Take 1 tablet (10 mg total) by mouth 3 (three) times daily. 20 tablet 0  . dexamethasone (DECADRON) 4 MG tablet Take 1 tablet (4 mg total) by mouth 2 (two) times daily with a meal. 10 tablet 0  . HYDROcodone-acetaminophen (NORCO/VICODIN) 5-325 MG tablet Take 1 tablet by mouth every 4 (four) hours as needed. 12 tablet 0  . losartan (COZAAR) 25 MG tablet Take 0.5 tablets (12.5 mg total) by mouth daily. 45 tablet 3  . methocarbamol (ROBAXIN) 750 MG tablet Take 1 tablet (750 mg total) by mouth 3 (three) times daily as needed (muscle spasm/pain). 15 tablet 0  . metoprolol tartrate (LOPRESSOR) 25 MG tablet TAKE 1/2 TABLET BY MOUTH TWICE DAILY 90 tablet 0  . nitroGLYCERIN (NITROSTAT) 0.4 MG SL  tablet Place 1 tablet (0.4 mg total) under the tongue every 5 (five) minutes as needed for chest pain. 25 tablet 3  . predniSONE (DELTASONE) 20 MG tablet Take 3 po QD x 3d , then 2 po QD x 3d then 1 po QD x 3d 18 tablet 0  . varenicline (CHANTIX) 0.5 MG tablet Take 1 tablet (0.5 mg total) by mouth 2 (two) times daily. 60 tablet 0   No current facility-administered medications for this visit.     Past Surgical History:  Procedure Laterality Date  . CARDIAC CATHETERIZATION N/A 03/10/2016   Procedure: Left Heart Cath and Coronary Angiography;  Surgeon: [facility] 03/12/2016, MD;  Location: [facility] INVASIVE CV LAB;  Service: Cardiovascular;  Laterality: N/A;  . CARDIAC CATHETERIZATION N/A 03/10/2016   Procedure: Coronary Stent Intervention;  Surgeon: [facility] 03/12/2016, MD;  Location: [facility] INVASIVE CV LAB;  Service: Cardiovascular;  Laterality: N/A;  . CARDIAC CATHETERIZATION N/A 03/12/2016   Procedure: Coronary Stent Intervention;  Surgeon: 03/14/2016, MD;  Location: [facility] INVASIVE CV LAB;  Service: Cardiovascular;  Laterality: N/A;  . TOTAL HIP ARTHROPLASTY     bilateral     Allergies  Allergen Reactions  . Ibuprofen Swelling    Swelling of face      No family history on file.   Social  History [facility] reports that he has been smoking. He has been smoking about 1.00 pack per day. He has never used smokeless tobacco. [facility] reports current alcohol use.   Review of Systems CONSTITUTIONAL: No weight loss, fever, chills, weakness or fatigue.  HEENT: Eyes: No visual loss, blurred vision, double vision or yellow sclerae.No hearing loss, sneezing, congestion, runny nose or sore throat.  SKIN: No rash or itching.  CARDIOVASCULAR: per hpi RESPIRATORY: No shortness of breath, cough or sputum.  GASTROINTESTINAL: No anorexia, nausea, vomiting or diarrhea. No abdominal pain or blood.  GENITOURINARY: No burning on urination, no polyuria NEUROLOGICAL: No headache, dizziness, syncope, paralysis,  ataxia, numbness or tingling in the extremities. No change in bowel or bladder control.  MUSCULOSKELETAL: No muscle, back pain, joint pain or stiffness.  LYMPHATICS: No enlarged nodes. No history of splenectomy.  PSYCHIATRIC: No history of depression or anxiety.  ENDOCRINOLOGIC: No reports of sweating, cold or heat intolerance. No polyuria or polydipsia.  Marland Kitchen   Physical Examination Today's Vitals   02/18/21 1326  BP: 140/60  Pulse: 72  SpO2: 97%  Weight: 174 lb (78.9 kg)  Height: 5\' 9"  (1.753 James)   Body mass index is 25.7 kg/James.  Gen: resting comfortably, no acute distress HEENT: no scleral icterus, pupils equal round and reactive, no palptable cervical adenopathy,  CV: RRR, no James/rg, no jvd Resp: Clear to auscultation bilaterally GI: abdomen is soft, non-tender, non-distended, normal bowel sounds, no hepatosplenomegaly MSK: extremities are warm, no edema.  Skin: warm, no rash Neuro:  no focal deficits Psych: appropriate affect   Diagnostic Studies  02/2016 Cath  Ost 1st Mrg lesion, 90% stenosed.  Prox LAD lesion, 30% stenosed.  Mid RCA lesion, 95% stenosed.  Ost 2nd Mrg to 2nd Mrg lesion, 100% stenosed. Post intervention, there is a 0% residual stenosis.  Prox Cx lesion, 50% stenosed. Post intervention, there is a 10% residual stenosis.  1. 2 vessel obstructive CAD. The culprit lesion is occlusion of a large OM2 [facility]. There is severe mid RCA stenosis. The first OM is very small. 2. Successful stenting of the second OM with DES. POBA of the distal LCx through the stent struts.  Plan: DAPT for one year. Recommend PCI of the RCA prior to DC. Will assess LV function with Echo.  02/2016 Cath  Prox RCA lesion, 99% stenosed. Post intervention, there is a 0% residual stenosis.  1. Severe stenosis mid RCA 2. Successful PTCA/DES x 1 mid RCA  Recommendations: Will continue DAPT with ASA and Brilinta. Continue statin and beta blocker.   02/2016 echo Study  Conclusions  - Left ventricle: The cavity size was normal. Wall thickness was normal. Systolic function was mildly reduced. The estimated ejection fraction was in the range of 45% to 50%. There is hypokinesis of the inferolateral myocardium. Left ventricular diastolic function parameters were normal. - Tricuspid valve: There was mild-moderate regurgitation. - Pulmonary arteries: Systolic pressure was mildly increased. PA peak pressure: 42 mm Hg (S).  Impressions:  - Hypokinesis of the inferior lateral wall with overall mildly reduced LV function; redundant MV chordae; trace MR; mild to moderate TR; mildly elevated pulmonary pressure.   Assessment and Plan   1. CAD - no symptoms -change lopressor to 25mg  bid dosing - continue current meds - EKG shows SR, no acute ischemic changes  2. Hyperlipidemia - did not tolerate higher dose atorva, doing well on 40mg  daily -continue statin, request pcp labs  3. HTN - mildly elevated, to start taking lopressor  bid, follow bp's  Counseled on smoking cessation. He is going to order nicotine patches through his insurance company, we discussed appropriate dosing.      Antoine Poche, James.D.

## 2021-03-03 DIAGNOSIS — E785 Hyperlipidemia, unspecified: Secondary | ICD-10-CM | POA: Diagnosis not present

## 2021-03-03 DIAGNOSIS — I2510 Atherosclerotic heart disease of native coronary artery without angina pectoris: Secondary | ICD-10-CM | POA: Diagnosis not present

## 2021-03-21 DIAGNOSIS — I5022 Chronic systolic (congestive) heart failure: Secondary | ICD-10-CM | POA: Diagnosis not present

## 2021-03-21 DIAGNOSIS — I255 Ischemic cardiomyopathy: Secondary | ICD-10-CM | POA: Diagnosis not present

## 2021-03-21 DIAGNOSIS — I2510 Atherosclerotic heart disease of native coronary artery without angina pectoris: Secondary | ICD-10-CM | POA: Diagnosis not present

## 2021-03-21 DIAGNOSIS — Z0001 Encounter for general adult medical examination with abnormal findings: Secondary | ICD-10-CM | POA: Diagnosis not present

## 2021-03-21 DIAGNOSIS — F17210 Nicotine dependence, cigarettes, uncomplicated: Secondary | ICD-10-CM | POA: Diagnosis not present

## 2021-03-21 DIAGNOSIS — Z1389 Encounter for screening for other disorder: Secondary | ICD-10-CM | POA: Diagnosis not present

## 2021-04-21 DIAGNOSIS — E785 Hyperlipidemia, unspecified: Secondary | ICD-10-CM | POA: Diagnosis not present

## 2021-04-21 DIAGNOSIS — I5022 Chronic systolic (congestive) heart failure: Secondary | ICD-10-CM | POA: Diagnosis not present

## 2021-05-21 DIAGNOSIS — E785 Hyperlipidemia, unspecified: Secondary | ICD-10-CM | POA: Diagnosis not present

## 2021-05-21 DIAGNOSIS — I2510 Atherosclerotic heart disease of native coronary artery without angina pectoris: Secondary | ICD-10-CM | POA: Diagnosis not present

## 2021-05-21 DIAGNOSIS — I5022 Chronic systolic (congestive) heart failure: Secondary | ICD-10-CM | POA: Diagnosis not present

## 2021-06-21 DIAGNOSIS — I5022 Chronic systolic (congestive) heart failure: Secondary | ICD-10-CM | POA: Diagnosis not present

## 2021-06-21 DIAGNOSIS — I2510 Atherosclerotic heart disease of native coronary artery without angina pectoris: Secondary | ICD-10-CM | POA: Diagnosis not present

## 2021-07-10 DIAGNOSIS — L309 Dermatitis, unspecified: Secondary | ICD-10-CM | POA: Diagnosis not present

## 2021-07-10 DIAGNOSIS — E785 Hyperlipidemia, unspecified: Secondary | ICD-10-CM | POA: Diagnosis not present

## 2021-07-10 DIAGNOSIS — I5022 Chronic systolic (congestive) heart failure: Secondary | ICD-10-CM | POA: Diagnosis not present

## 2021-08-10 DIAGNOSIS — M4802 Spinal stenosis, cervical region: Secondary | ICD-10-CM | POA: Diagnosis not present

## 2021-08-10 DIAGNOSIS — I5022 Chronic systolic (congestive) heart failure: Secondary | ICD-10-CM | POA: Diagnosis not present

## 2021-09-09 DIAGNOSIS — M4802 Spinal stenosis, cervical region: Secondary | ICD-10-CM | POA: Diagnosis not present

## 2021-09-09 DIAGNOSIS — I2510 Atherosclerotic heart disease of native coronary artery without angina pectoris: Secondary | ICD-10-CM | POA: Diagnosis not present

## 2021-09-27 DIAGNOSIS — I5022 Chronic systolic (congestive) heart failure: Secondary | ICD-10-CM | POA: Diagnosis not present

## 2021-09-27 DIAGNOSIS — I10 Essential (primary) hypertension: Secondary | ICD-10-CM | POA: Diagnosis not present

## 2021-09-27 DIAGNOSIS — E785 Hyperlipidemia, unspecified: Secondary | ICD-10-CM | POA: Diagnosis not present

## 2021-09-27 DIAGNOSIS — L209 Atopic dermatitis, unspecified: Secondary | ICD-10-CM | POA: Diagnosis not present

## 2021-10-08 ENCOUNTER — Emergency Department (HOSPITAL_COMMUNITY)
Admission: EM | Admit: 2021-10-08 | Discharge: 2021-10-08 | Disposition: A | Discharge status: Home / Self Care | Payer: Medicare Other | Attending: Emergency Medicine | Admitting: Emergency Medicine

## 2021-10-08 ENCOUNTER — Encounter (HOSPITAL_COMMUNITY): Payer: Self-pay | Admitting: *Deleted

## 2021-10-08 ENCOUNTER — Other Ambulatory Visit: Payer: Self-pay

## 2021-10-08 DIAGNOSIS — B349 Viral infection, unspecified: Secondary | ICD-10-CM

## 2021-10-08 DIAGNOSIS — Z79899 Other long term (current) drug therapy: Secondary | ICD-10-CM | POA: Insufficient documentation

## 2021-10-08 DIAGNOSIS — Z7982 Long term (current) use of aspirin: Secondary | ICD-10-CM | POA: Diagnosis not present

## 2021-10-08 DIAGNOSIS — I2510 Atherosclerotic heart disease of native coronary artery without angina pectoris: Secondary | ICD-10-CM | POA: Diagnosis not present

## 2021-10-08 DIAGNOSIS — F17210 Nicotine dependence, cigarettes, uncomplicated: Secondary | ICD-10-CM | POA: Diagnosis not present

## 2021-10-08 DIAGNOSIS — J101 Influenza due to other identified influenza virus with other respiratory manifestations: Secondary | ICD-10-CM | POA: Diagnosis not present

## 2021-10-08 DIAGNOSIS — Z96643 Presence of artificial hip joint, bilateral: Secondary | ICD-10-CM | POA: Insufficient documentation

## 2021-10-08 DIAGNOSIS — Z20822 Contact with and (suspected) exposure to covid-19: Secondary | ICD-10-CM | POA: Insufficient documentation

## 2021-10-08 DIAGNOSIS — J029 Acute pharyngitis, unspecified: Secondary | ICD-10-CM | POA: Diagnosis not present

## 2021-10-08 LAB — RESP PANEL BY RT-PCR (FLU A&B, COVID) ARPGX2
Influenza A by PCR: POSITIVE — AB
Influenza B by PCR: NEGATIVE
SARS Coronavirus 2 by RT PCR: NEGATIVE

## 2021-10-08 MED ORDER — ACETAMINOPHEN 500 MG PO TABS
1000.0000 mg | ORAL_TABLET | Freq: Once | ORAL | Status: AC
  Administered 2021-10-08: 1000 mg via ORAL
  Filled 2021-10-08: qty 2

## 2021-10-08 NOTE — ED Triage Notes (Signed)
Pt c/o sore throat and body aches that started last night; pt states his temp has been as high as 105

## 2021-10-08 NOTE — Discharge Instructions (Addendum)
If your flu test is positive, you should quarantine at home for 7 days.  Drink lots of water and take tylenol as needed for headache, fever, and pain.

## 2021-10-08 NOTE — ED Provider Notes (Signed)
[facility] EMERGENCY DEPARTMENT Provider Note   CSN: [id number] Arrival date & time: 10/08/21  4034     History Chief Complaint  Patient presents with   Sore Throat    [facility] is a 60 y.o. male presenting to ED with multiple complaints.  Onset yesterday.  Headache, sore throat, arthralgia, myalgia, loss of appetite, mild cough.  Took tylenol for it.  Allergic to NSAIDS.  No sick contacts in the house.  HPI     Past Medical History:  Diagnosis Date   CAD (coronary artery disease)    a. 02/2016: STEMI - 100% stenosis OM2 w/ DES placed, POBA to LCx, and staged PCI of 95% stenosed RCA w/ DES.   Ischemic cardiomyopathy    a. 02/2016: EF 45-50% w/ diffuse inferolateral hypokinesis    Patient Active Problem List   Diagnosis Date Noted   Unstable angina (HCC)    ST elevation (STEMI) myocardial infarction involving left circumflex coronary artery (HCC) 03/10/2016   Tobacco abuse 03/10/2016    Past Surgical History:  Procedure Laterality Date   CARDIAC CATHETERIZATION N/A 03/10/2016   Procedure: Left Heart Cath and Coronary Angiography;  Surgeon: Peter M Swaziland, MD;  Location: [facility] INVASIVE CV LAB;  Service: Cardiovascular;  Laterality: N/A;   CARDIAC CATHETERIZATION N/A 03/10/2016   Procedure: Coronary Stent Intervention;  Surgeon: Peter M Swaziland, MD;  Location: [facility] INVASIVE CV LAB;  Service: Cardiovascular;  Laterality: N/A;   CARDIAC CATHETERIZATION N/A 03/12/2016   Procedure: Coronary Stent Intervention;  Surgeon: Kathleene Hazel, MD;  Location: [facility] INVASIVE CV LAB;  Service: Cardiovascular;  Laterality: N/A;   TOTAL HIP ARTHROPLASTY     bilateral       History reviewed. No pertinent family history.  Social History   Tobacco Use   Smoking status: Every Day    Packs/day: 1.00    Types: Cigarettes   Smokeless tobacco: Never  Vaping Use   Vaping Use: Never used  Substance Use Topics   Alcohol use: Yes    Comment: weekly   Drug use: No    Home  Medications Prior to Admission medications   Medication Sig Start Date End Date Taking? Authorizing Provider  aspirin 81 MG chewable tablet Chew 1 tablet (81 mg total) by mouth daily. 03/13/16   Strader, Lennart Pall, PA-C  atorvastatin (LIPITOR) 40 MG tablet TAKE 1 TABLET BY MOUTH EVERY DAY 06/08/17   Antoine Poche, MD  cyclobenzaprine (FLEXERIL) 10 MG tablet Take 1 tablet (10 mg total) by mouth 3 (three) times daily. 08/16/18   Ivery Quale, PA-C  losartan (COZAAR) 25 MG tablet Take 25 mg by mouth daily.    [provider]  metoprolol tartrate (LOPRESSOR) 25 MG tablet Take 1 tablet (25 mg total) by mouth 2 (two) times daily. 02/18/21 05/19/21  Antoine Poche, MD  nitroGLYCERIN (NITROSTAT) 0.4 MG SL tablet Place 1 tablet (0.4 mg total) under the tongue every 5 (five) minutes as needed for chest pain. 03/13/16   Strader, Lennart Pall, PA-C    Allergies    Ibuprofen  Review of Systems   Review of Systems  Constitutional:  Positive for appetite change, chills, fatigue and fever.  HENT:  Positive for sore throat. Negative for ear pain.   Eyes:  Negative for pain and visual disturbance.  Respiratory:  Positive for cough. Negative for shortness of breath.   Cardiovascular:  Negative for chest pain and palpitations.  Gastrointestinal:  Positive for nausea. Negative for abdominal pain and vomiting.  Genitourinary:  Negative for dysuria and hematuria.  Musculoskeletal:  Positive for arthralgias and myalgias.  Skin:  Negative for color change and rash.  Neurological:  Positive for headaches. Negative for syncope.  All other systems reviewed and are negative.  Physical Exam Updated Vital Signs BP (!) 155/74 (BP Location: Right Arm)   Pulse 77   Temp 99.7 F (37.6 C) (Oral)   Resp 18   Ht 5\' 9"  (1.753 m)   Wt 83.9 kg   SpO2 97%   BMI 27.32 kg/m   Physical Exam Constitutional:      General: He is not in acute distress. HENT:     Head: Normocephalic and atraumatic.  Eyes:      Conjunctiva/sclera: Conjunctivae normal.     Pupils: Pupils are equal, round, and reactive to light.  Cardiovascular:     Rate and Rhythm: Normal rate and regular rhythm.  Pulmonary:     Effort: Pulmonary effort is normal. No respiratory distress.  Abdominal:     General: There is no distension.     Tenderness: There is no abdominal tenderness.  Skin:    General: Skin is warm and dry.  Neurological:     General: No focal deficit present.     Mental Status: He is alert. Mental status is at baseline.  Psychiatric:        Mood and Affect: Mood normal.        Behavior: Behavior normal.    ED Results / Procedures / Treatments   Labs (all labs ordered are listed, but only abnormal results are displayed) Labs Reviewed  RESP PANEL BY RT-PCR (FLU A&B, COVID) ARPGX2    EKG None  Radiology No results found.  Procedures Procedures   Medications Ordered in ED Medications  acetaminophen (TYLENOL) tablet 1,000 mg (has no administration in time range)    ED Course  I have reviewed the triage vital signs and the nursing notes.  Pertinent labs & imaging results that were available during my care of the patient were reviewed by me and considered in my medical decision making (see chart for details).  Suspect viral syndrome - likely influenza.  Will swab Vitals stable  Doubt sepsis Recommend tylenol at home  F/u with PCP this week     Final Clinical Impression(s) / ED Diagnoses Final diagnoses:  Viral illness    Rx / DC Orders ED Discharge Orders     None        Zacchaeus Halm, Carola Rhine, MD 10/08/21 [phone]

## 2021-10-28 DIAGNOSIS — I5022 Chronic systolic (congestive) heart failure: Secondary | ICD-10-CM | POA: Diagnosis not present

## 2021-10-28 DIAGNOSIS — I2510 Atherosclerotic heart disease of native coronary artery without angina pectoris: Secondary | ICD-10-CM | POA: Diagnosis not present

## 2021-11-28 DIAGNOSIS — I5022 Chronic systolic (congestive) heart failure: Secondary | ICD-10-CM | POA: Diagnosis not present

## 2021-11-28 DIAGNOSIS — I2510 Atherosclerotic heart disease of native coronary artery without angina pectoris: Secondary | ICD-10-CM | POA: Diagnosis not present

## 2021-12-29 DIAGNOSIS — I255 Ischemic cardiomyopathy: Secondary | ICD-10-CM | POA: Diagnosis not present

## 2021-12-29 DIAGNOSIS — I2510 Atherosclerotic heart disease of native coronary artery without angina pectoris: Secondary | ICD-10-CM | POA: Diagnosis not present

## 2022-01-26 DIAGNOSIS — I2510 Atherosclerotic heart disease of native coronary artery without angina pectoris: Secondary | ICD-10-CM | POA: Diagnosis not present

## 2022-01-26 DIAGNOSIS — F17210 Nicotine dependence, cigarettes, uncomplicated: Secondary | ICD-10-CM | POA: Diagnosis not present

## 2022-02-26 DIAGNOSIS — I5022 Chronic systolic (congestive) heart failure: Secondary | ICD-10-CM | POA: Diagnosis not present

## 2022-02-26 DIAGNOSIS — I2510 Atherosclerotic heart disease of native coronary artery without angina pectoris: Secondary | ICD-10-CM | POA: Diagnosis not present

## 2022-03-05 ENCOUNTER — Other Ambulatory Visit: Payer: Self-pay | Admitting: Cardiology

## 2022-04-16 DIAGNOSIS — I2510 Atherosclerotic heart disease of native coronary artery without angina pectoris: Secondary | ICD-10-CM | POA: Diagnosis not present

## 2022-04-16 DIAGNOSIS — M4802 Spinal stenosis, cervical region: Secondary | ICD-10-CM | POA: Diagnosis not present

## 2022-04-21 ENCOUNTER — Other Ambulatory Visit: Payer: Self-pay | Admitting: Cardiology

## 2022-05-15 DIAGNOSIS — I2510 Atherosclerotic heart disease of native coronary artery without angina pectoris: Secondary | ICD-10-CM | POA: Diagnosis not present

## 2022-05-15 DIAGNOSIS — I255 Ischemic cardiomyopathy: Secondary | ICD-10-CM | POA: Diagnosis not present

## 2022-05-15 DIAGNOSIS — Z0001 Encounter for general adult medical examination with abnormal findings: Secondary | ICD-10-CM | POA: Diagnosis not present

## 2022-05-15 DIAGNOSIS — I5022 Chronic systolic (congestive) heart failure: Secondary | ICD-10-CM | POA: Diagnosis not present

## 2022-05-15 DIAGNOSIS — Z1389 Encounter for screening for other disorder: Secondary | ICD-10-CM | POA: Diagnosis not present

## 2022-05-15 DIAGNOSIS — F17210 Nicotine dependence, cigarettes, uncomplicated: Secondary | ICD-10-CM | POA: Diagnosis not present

## 2022-06-06 ENCOUNTER — Other Ambulatory Visit (HOSPITAL_COMMUNITY): Payer: Self-pay | Admitting: Internal Medicine

## 2022-06-06 ENCOUNTER — Ambulatory Visit (HOSPITAL_COMMUNITY)
Admission: RE | Admit: 2022-06-06 | Discharge: 2022-06-06 | Disposition: A | Discharge status: Home / Self Care | Payer: Medicare Other | Source: Ambulatory Visit | Attending: Internal Medicine | Admitting: Internal Medicine

## 2022-06-06 DIAGNOSIS — M25551 Pain in right hip: Secondary | ICD-10-CM | POA: Diagnosis not present

## 2022-06-14 DIAGNOSIS — I2510 Atherosclerotic heart disease of native coronary artery without angina pectoris: Secondary | ICD-10-CM | POA: Diagnosis not present

## 2022-06-14 DIAGNOSIS — E785 Hyperlipidemia, unspecified: Secondary | ICD-10-CM | POA: Diagnosis not present

## 2022-07-15 DIAGNOSIS — I2510 Atherosclerotic heart disease of native coronary artery without angina pectoris: Secondary | ICD-10-CM | POA: Diagnosis not present

## 2022-07-15 DIAGNOSIS — E785 Hyperlipidemia, unspecified: Secondary | ICD-10-CM | POA: Diagnosis not present

## 2022-08-15 DIAGNOSIS — I2510 Atherosclerotic heart disease of native coronary artery without angina pectoris: Secondary | ICD-10-CM | POA: Diagnosis not present

## 2022-08-15 DIAGNOSIS — E785 Hyperlipidemia, unspecified: Secondary | ICD-10-CM | POA: Diagnosis not present

## 2022-09-14 DIAGNOSIS — I5022 Chronic systolic (congestive) heart failure: Secondary | ICD-10-CM | POA: Diagnosis not present

## 2022-09-14 DIAGNOSIS — M25551 Pain in right hip: Secondary | ICD-10-CM | POA: Diagnosis not present

## 2022-09-19 ENCOUNTER — Telehealth: Payer: Self-pay

## 2022-09-19 DIAGNOSIS — M25551 Pain in right hip: Secondary | ICD-10-CM

## 2022-09-19 NOTE — Telephone Encounter (Signed)
Spoke with pt and let him know the information from provider below. Agrees to see provider in [place], let pt know I will be sending all of his information over and they will contact him for scheduling. Verbalized understanding.

## 2022-09-19 NOTE — Telephone Encounter (Signed)
-----   Message from Mordecai Rasmussen, MD sent at 09/18/2022  8:35 AM EDT ----- Marijean Heath  Mr. Hirano is scheduled to see me next week for right hip pain.  He needs a revision hip replacement.  I have discussed this with Dr. Zachery Dakins with [facility] and he has agreed to see him.  Please place a referral and update the patient.  I am happy to see him in clinic to explain everything to him, but if he would prefer to just go to [place], I understand.   Let me know if you have any questions  Elta Guadeloupe

## 2022-09-23 ENCOUNTER — Ambulatory Visit: Payer: Medicare Other | Admitting: Orthopedic Surgery

## 2022-09-23 ENCOUNTER — Telehealth: Payer: Self-pay | Admitting: Orthopedic Surgery

## 2022-09-23 NOTE — Telephone Encounter (Signed)
Patient was scheduled to see Dr. Amedeo Kinsman today, that appointment was cancelled and it was recommended for the patient to see a provider in [place], the patient was trying to get information regarding that.  Discussed with Brand Males, gave the patient the number to the office [phone] and told him they have the referral and they'll get him scheduled.

## 2022-09-26 ENCOUNTER — Other Ambulatory Visit: Payer: Self-pay | Admitting: Orthopedic Surgery

## 2022-09-26 DIAGNOSIS — M25551 Pain in right hip: Secondary | ICD-10-CM | POA: Diagnosis not present

## 2022-09-26 DIAGNOSIS — Z01818 Encounter for other preprocedural examination: Secondary | ICD-10-CM

## 2022-09-29 ENCOUNTER — Telehealth: Payer: Self-pay | Admitting: *Deleted

## 2022-09-29 NOTE — Telephone Encounter (Signed)
   Name: [facility]  DOB: October 21, 1961  MRN: [id number]  Primary Cardiologist: None  Chart reviewed as part of pre-operative protocol coverage. Because of [facility]'s past medical history and time since last visit, he will require a follow-up in-office visit in order to better assess preoperative cardiovascular risk.  Pre-op covering staff: - Please schedule appointment and call patient to inform them. If patient already had an upcoming appointment within acceptable timeframe, please add "pre-op clearance" to the appointment notes so provider is aware. - Please contact requesting surgeon's office via preferred method (i.e, phone, fax) to inform them of need for appointment prior to surgery.  ASA hold can be determined at the time of the appointment.  Sharlene Dory, PA-C  09/29/2022, 4:34 PM

## 2022-09-29 NOTE — Telephone Encounter (Signed)
   Pre-operative Risk Assessment    Patient Name: [facility]  DOB: [birth year] MRN: [id number]      Request for Surgical Clearance    Procedure:   CONVERSION TO RIGHT TOTAL HIP ARTHROPLASTY  Date of Surgery:  Clearance TBD                                 Surgeon:  DR. DANIEL MARCHWIANY Surgeon's Group or Practice Name:  [facility] Phone number:  [phone] EXT [phone] ATTN: [facility] Fax number:  [phone]   Type of Clearance Requested:   - Medical ; ASA    Type of Anesthesia:  Spinal   Additional requests/questions:    Elpidio Anis   09/29/2022, 1:30 PM

## 2022-10-15 DIAGNOSIS — I5022 Chronic systolic (congestive) heart failure: Secondary | ICD-10-CM | POA: Diagnosis not present

## 2022-10-15 DIAGNOSIS — E785 Hyperlipidemia, unspecified: Secondary | ICD-10-CM | POA: Diagnosis not present

## 2022-10-17 ENCOUNTER — Ambulatory Visit
Admission: RE | Admit: 2022-10-17 | Discharge: 2022-10-17 | Disposition: A | Discharge status: Home / Self Care | Payer: Medicare Other | Source: Ambulatory Visit | Attending: Orthopedic Surgery | Admitting: Orthopedic Surgery

## 2022-10-17 DIAGNOSIS — Z01818 Encounter for other preprocedural examination: Secondary | ICD-10-CM

## 2022-10-17 DIAGNOSIS — M25551 Pain in right hip: Secondary | ICD-10-CM | POA: Diagnosis not present

## 2022-10-23 NOTE — Progress Notes (Signed)
Today's  Office Visit    Patient Name: Tyler James Date of Encounter: 10/24/2022  PCP:  Benetta Spar, MD   [facility]  Cardiologist:  Dina Rich, MD  Advanced Practice Provider:  No care team member to display Electrophysiologist:  None   HPI    Tyler James is a 61 y.o. male with past medical history of CAD (NSTEMI 02/2016 received DES to OM2 and DES to RCA) hyperlipidemia, hypertension, ischemic cardiomyopathy (EF 45 to 50% with diffuse inferior lateral hypokinesis) presents today for preop evaluation.  He was last seen 02/18/2021 and at that time he had experienced a facial rash on lisinopril and was switched to losartan which she had been tolerating well.  No chest pain, shortness of breath, or DOE.  Compliant with medications.  Today, he tells me his biggest issue has been his hip. He tells me long periods of standing and walking bother is hip. He gets sharp pains in the joint. No issues with his heart. He denies chest pain, SOB, and palpitations. He is taking his metoprolol tartrate once a day and I explained that its a 12 hours drug. We changed him to the succinate version to take daily for better compliance. He can meet 4 mets of activity and what limits his activity is his hip, not his heart.   Reports no shortness of breath nor dyspnea on exertion. Reports no chest pain, pressure, or tightness. No edema, orthopnea, PND. Reports no palpitations.    Past Medical History    Past Medical History:  Diagnosis Date   CAD (coronary artery disease)    a. 02/2016: STEMI - 100% stenosis OM2 w/ DES placed, POBA to LCx, and staged PCI of 95% stenosed RCA w/ DES.   Ischemic cardiomyopathy    a. 02/2016: EF 45-50% w/ diffuse inferolateral hypokinesis   Past Surgical History:  Procedure Laterality Date   CARDIAC CATHETERIZATION N/A 03/10/2016   Procedure: Left Heart Cath and Coronary Angiography;  Surgeon: Peter M Swaziland, MD;  Location: [facility] INVASIVE  CV LAB;  Service: Cardiovascular;  Laterality: N/A;   CARDIAC CATHETERIZATION N/A 03/10/2016   Procedure: Coronary Stent Intervention;  Surgeon: Peter M Swaziland, MD;  Location: [facility] INVASIVE CV LAB;  Service: Cardiovascular;  Laterality: N/A;   CARDIAC CATHETERIZATION N/A 03/12/2016   Procedure: Coronary Stent Intervention;  Surgeon: Kathleene Hazel, MD;  Location: [facility] INVASIVE CV LAB;  Service: Cardiovascular;  Laterality: N/A;   TOTAL HIP ARTHROPLASTY     bilateral    Allergies  Allergies  Allergen Reactions   Ibuprofen Swelling    Swelling of face    EKGs/Labs/Other Studies Reviewed:   The following studies were reviewed today: No recent studies  EKG:  EKG is  ordered today.  The ekg ordered today demonstrates NSR rate 77 bpm  Recent Labs: No results found for requested labs within last 365 days.  Recent Lipid Panel    Component Value Date/Time   CHOL 144 03/13/2016 0214   TRIG 149 03/13/2016 0214   HDL 45 03/13/2016 0214   CHOLHDL 3.2 03/13/2016 0214   VLDL 30 03/13/2016 0214   LDLCALC 69 03/13/2016 0214    Home Medications   Current Meds  Medication Sig   aspirin 81 MG chewable tablet Chew 1 tablet (81 mg total) by mouth daily.   atorvastatin (LIPITOR) 40 MG tablet TAKE 1 TABLET BY MOUTH EVERY DAY   cyclobenzaprine (FLEXERIL) 10 MG tablet Take 10 mg by mouth daily at  12 noon.   losartan (COZAAR) 25 MG tablet Take 25 mg by mouth daily.   metoprolol tartrate (LOPRESSOR) 25 MG tablet Take 25 mg by mouth daily at 12 noon.   Multiple Vitamin (MULTIVITAMIN) tablet Take 1 tablet by mouth daily.   nitroGLYCERIN (NITROSTAT) 0.4 MG SL tablet Place 1 tablet (0.4 mg total) under the tongue every 5 (five) minutes as needed for chest pain.     Review of Systems      All other systems reviewed and are otherwise negative except as noted above.  Physical Exam    VS:  BP 120/60   Pulse 77   Ht 5\' 9"  (1.753 m)   Wt 160 lb 3.2 oz (72.7 kg)   SpO2 97%   BMI 23.66 kg/m   , BMI Body mass index is 23.66 kg/m.  Wt Readings from Last 3 Encounters:  10/24/22 160 lb 3.2 oz (72.7 kg)  10/08/21 185 lb (83.9 kg)  02/18/21 174 lb (78.9 kg)     GEN: Well nourished, well developed, in no acute distress. HEENT: normal. Neck: Supple, no JVD, carotid bruits, or masses. Cardiac: RRR, no murmurs, rubs, or gallops. No clubbing, cyanosis, edema.  Radials/PT 2+ and equal bilaterally.  Respiratory:  Respirations regular and unlabored, clear to auscultation bilaterally. GI: Soft, nontender, nondistended. MS: No deformity or atrophy. Skin: Warm and dry, no rash. Neuro:  Strength and sensation are intact. Psych: Normal affect.  Assessment & Plan    Preop Clearance  Mr. Leaf perioperative risk of a major cardiac event is 6.6% according to the Revised Cardiac Risk Index (RCRI).  Therefore, he is at high risk for perioperative complications.   His functional capacity is fair at 4.73 METs according to the Duke Activity Status Index (DASI). Recommendations: According to ACC/AHA guidelines, no further cardiovascular testing needed.  The patient may proceed to surgery at acceptable risk.   Antiplatelet and/or Anticoagulation Recommendations: Aspirin can be held for 7 days prior to his surgery.  Please resume Aspirin post operatively when it is felt to be safe from a bleeding standpoint.    CAD -no chest pains -continue current medications  Hyperlipidemia -continue lipitor 40mg  daily -LDL 53 -will need updated lipid panel on next visit  Hypertension -BP well controlled today -continue current medications  Smoking  -cessation advised           Disposition: Follow up 2 months with Yolande Jolly, MD or APP.  Signed, , PA-C 10/24/2022, 2:31 PM [facility]

## 2022-10-24 ENCOUNTER — Encounter: Payer: Self-pay | Admitting: Physician Assistant

## 2022-10-24 ENCOUNTER — Ambulatory Visit: Payer: Medicare Other | Attending: Cardiology | Admitting: Physician Assistant

## 2022-10-24 VITALS — BP 120/60 | HR 77 | Ht 69.0 in | Wt 160.2 lb

## 2022-10-24 DIAGNOSIS — I2510 Atherosclerotic heart disease of native coronary artery without angina pectoris: Secondary | ICD-10-CM | POA: Diagnosis not present

## 2022-10-24 DIAGNOSIS — I10 Essential (primary) hypertension: Secondary | ICD-10-CM | POA: Diagnosis not present

## 2022-10-24 DIAGNOSIS — E785 Hyperlipidemia, unspecified: Secondary | ICD-10-CM

## 2022-10-24 DIAGNOSIS — Z01810 Encounter for preprocedural cardiovascular examination: Secondary | ICD-10-CM | POA: Diagnosis not present

## 2022-10-24 MED ORDER — METOPROLOL SUCCINATE ER 25 MG PO TB24: 25.0000 mg | ORAL_TABLET | Freq: Every day | ORAL | 3 refills | Status: AC

## 2022-10-24 NOTE — Patient Instructions (Addendum)
Medication Instructions:  Your physician has recommended you make the following change in your medication:  1.Stop metoprolol tartrate 2.Start metoprolol succinate 25 mg daily  *If you need a refill on your cardiac medications before your next appointment, please call your pharmacy*   Lab Work: None If you have labs (blood work) drawn today and your tests are completely normal, you will receive your results only by: MyChart Message (if you have MyChart) OR A paper copy in the mail If you have any lab test that is abnormal or we need to change your treatment, we will call you to review the results.   Follow-Up: At [facility], you and your health needs are our priority.  As part of our continuing mission to provide you with exceptional heart care, we have created designated Provider Care Teams.  These Care Teams include your primary Cardiologist (physician) and Advanced Practice Providers (APPs -  Physician Assistants and Nurse Practitioners) who all work together to provide you with the care you need, when you need it.  Your next appointment:   2-3 month(s)  The format for your next appointment:   In Person  Provider:   Dina Rich, MD   Important Information About Sugar

## 2022-10-28 DIAGNOSIS — M25559 Pain in unspecified hip: Secondary | ICD-10-CM | POA: Diagnosis not present

## 2022-10-28 DIAGNOSIS — M25551 Pain in right hip: Secondary | ICD-10-CM | POA: Diagnosis not present

## 2022-10-30 DIAGNOSIS — M25551 Pain in right hip: Secondary | ICD-10-CM | POA: Diagnosis not present

## 2022-10-30 DIAGNOSIS — I5022 Chronic systolic (congestive) heart failure: Secondary | ICD-10-CM | POA: Diagnosis not present

## 2022-10-30 DIAGNOSIS — F17200 Nicotine dependence, unspecified, uncomplicated: Secondary | ICD-10-CM | POA: Diagnosis not present

## 2022-10-30 DIAGNOSIS — E785 Hyperlipidemia, unspecified: Secondary | ICD-10-CM | POA: Diagnosis not present

## 2022-11-16 ENCOUNTER — Emergency Department (HOSPITAL_COMMUNITY): Payer: Medicare Other

## 2022-11-16 ENCOUNTER — Other Ambulatory Visit: Payer: Self-pay

## 2022-11-16 ENCOUNTER — Telehealth: Payer: Self-pay | Admitting: Physician Assistant

## 2022-11-16 ENCOUNTER — Encounter (HOSPITAL_COMMUNITY): Payer: Self-pay

## 2022-11-16 ENCOUNTER — Emergency Department (HOSPITAL_COMMUNITY)
Admission: EM | Admit: 2022-11-16 | Discharge: 2022-11-16 | Disposition: A | Discharge status: Home / Self Care | Payer: Medicare Other | Attending: Emergency Medicine | Admitting: Emergency Medicine

## 2022-11-16 DIAGNOSIS — Z7982 Long term (current) use of aspirin: Secondary | ICD-10-CM | POA: Diagnosis not present

## 2022-11-16 DIAGNOSIS — I2510 Atherosclerotic heart disease of native coronary artery without angina pectoris: Secondary | ICD-10-CM | POA: Insufficient documentation

## 2022-11-16 DIAGNOSIS — I10 Essential (primary) hypertension: Secondary | ICD-10-CM | POA: Diagnosis not present

## 2022-11-16 DIAGNOSIS — R0602 Shortness of breath: Secondary | ICD-10-CM | POA: Insufficient documentation

## 2022-11-16 DIAGNOSIS — R9431 Abnormal electrocardiogram [ECG] [EKG]: Secondary | ICD-10-CM | POA: Insufficient documentation

## 2022-11-16 LAB — CBC
HCT: 41.3 % (ref 39.0–52.0)
Hemoglobin: 13.4 g/dL (ref 13.0–17.0)
MCH: 24.9 pg — ABNORMAL LOW (ref 26.0–34.0)
MCHC: 32.4 g/dL (ref 30.0–36.0)
MCV: 76.6 fL — ABNORMAL LOW (ref 80.0–100.0)
Platelets: 204 10*3/uL (ref 150–400)
RBC: 5.39 MIL/uL (ref 4.22–5.81)
RDW: 14.6 % (ref 11.5–15.5)
WBC: 5.5 10*3/uL (ref 4.0–10.5)
nRBC: 0 % (ref 0.0–0.2)

## 2022-11-16 LAB — BASIC METABOLIC PANEL
Anion gap: 6 (ref 5–15)
BUN: 17 mg/dL (ref 8–23)
CO2: 25 mmol/L (ref 22–32)
Calcium: 9.1 mg/dL (ref 8.9–10.3)
Chloride: 109 mmol/L (ref 98–111)
Creatinine, Ser: 0.92 mg/dL (ref 0.61–1.24)
GFR, Estimated: >60 mL/min (ref >60)
Glucose, Bld: 116 mg/dL — ABNORMAL HIGH (ref 70–99)
Potassium: 4.1 mmol/L (ref 3.5–5.1)
Sodium: 140 mmol/L (ref 135–145)

## 2022-11-16 LAB — D-DIMER, QUANTITATIVE: D-Dimer, Quant: 0.54 ug/mL-FEU — ABNORMAL HIGH (ref 0.00–0.50)

## 2022-11-16 LAB — TROPONIN I (HIGH SENSITIVITY)
Troponin I (High Sensitivity): 10 ng/L (ref <18)
Troponin I (High Sensitivity): 11 ng/L (ref <18)

## 2022-11-16 NOTE — ED Triage Notes (Addendum)
Patient states that he woke this morning and felt like he couldn't catch his breath, feels like respirations are labored/rapid. Denies chest pain or URI symptoms. HX of MI without chest pain.

## 2022-11-16 NOTE — Discharge Instructions (Addendum)
You were seen for your shortness of breath in the emergency department.  Your EKG did show some abnormalities that were discussed with the cardiologist.  Follow-up with your primary doctor in 2-3 days regarding your visit.  Cardiology will call you about arranging a stress test.  If you do not hear from them within 78 hours please call your cardiologist.  Return immediately to the emergency department if you experience any of the following: Shortness of breath, chest pain, or any other concerning symptoms.    Thank you for visiting our Emergency Department. It was a pleasure taking care of you today.

## 2022-11-16 NOTE — Telephone Encounter (Signed)
   Dr. Carolan Clines discussed case with ER. Per her recommendation: "Please arrange an exercise SPECT for this patient in 1-2 weeks (with parameters that can be transitioned to a lexiscan if does not meet [facility]) at [facility]. Dr. Dominga Ferry is his primary cardiologist, results can be sent to his office." Can you please help? I was not quite sure how these are ordered at [facility]. Consent to be signed/discussed at time of nuc per usual protocol. Dx: chest pain. Please notify patient of pre-stress-test instructions and arrange f/u after test as well. Thank you!  Laurann Montana, PA-C

## 2022-11-16 NOTE — ED Provider Notes (Signed)
[facility] EMERGENCY DEPARTMENT Provider Note   CSN: [id number] Arrival date & time: 11/16/22  [id number]     History  Chief Complaint  Patient presents with   Shortness of Breath    Tyler James is a 61 y.o. male.  61 year old male with a history of CAD status post PCI, HTN, HLD, and ischemic cardiomyopathy with an EF of 45 to 50% who presents to the emergency department with shortness of breath.  Earlier today reports that he was having shortness of breath shortly after eating breakfast.  Went to the grocery store and was having dyspnea on exertion so came into the emergency department for evaluation.  Denies any cough or fevers.  No chest pain.  No diaphoresis or vomiting.  No lower extremity swelling recently.  No history of DVT/PE.  Reports that his shortness of breath is feeling much improved at this time; however, given his history of MI wanted to come into the emergency department for evaluation.      Home Medications Prior to Admission medications   Medication Sig Start Date End Date Taking? Authorizing Provider  aspirin 81 MG chewable tablet Chew 1 tablet (81 mg total) by mouth daily. 03/13/16   Strader, Fransisco Hertz, PA-C  atorvastatin (LIPITOR) 40 MG tablet TAKE 1 TABLET BY MOUTH EVERY DAY 06/08/17   Arnoldo Lenis, MD  cyclobenzaprine (FLEXERIL) 10 MG tablet Take 10 mg by mouth daily at 12 noon.    [provider]  losartan (COZAAR) 25 MG tablet Take 25 mg by mouth daily.    [provider]  metoprolol succinate (TOPROL XL) 25 MG 24 hr tablet Take 1 tablet (25 mg total) by mouth daily. 10/24/22   Elgie Collard, PA-C  Multiple Vitamin (MULTIVITAMIN) tablet Take 1 tablet by mouth daily.    [provider]  nitroGLYCERIN (NITROSTAT) 0.4 MG SL tablet Place 1 tablet (0.4 mg total) under the tongue every 5 (five) minutes as needed for chest pain. 03/13/16   Ahmed Prima, Fransisco Hertz, PA-C      Allergies    Ibuprofen    Review of Systems   Review of  Systems  Physical Exam Updated Vital Signs BP 135/78   Pulse (!) 57   Temp 98.7 F (37.1 C) (Oral)   Resp 15   Ht 5\' 9"  (1.753 m)   Wt 74.8 kg   SpO2 100%   BMI 24.37 kg/m  Physical Exam Vitals and nursing note reviewed.  Constitutional:      General: He is not in acute distress.    Appearance: He is well-developed.  HENT:     Head: Normocephalic and atraumatic.     Right Ear: External ear normal.     Left Ear: External ear normal.     Nose: Nose normal.  Eyes:     Extraocular Movements: Extraocular movements intact.     Conjunctiva/sclera: Conjunctivae normal.     Pupils: Pupils are equal, round, and reactive to light.  Cardiovascular:     Rate and Rhythm: Regular rhythm. Bradycardia present.     Heart sounds: Normal heart sounds.  Pulmonary:     Effort: Pulmonary effort is normal. No respiratory distress.     Breath sounds: Normal breath sounds.  Abdominal:     General: There is no distension.     Palpations: Abdomen is soft. There is no mass.     Tenderness: There is no abdominal tenderness. There is no guarding.  Musculoskeletal:     Cervical back: Normal  range of motion and neck supple.     Right lower leg: No edema.     Left lower leg: No edema.  Skin:    General: Skin is warm and dry.  Neurological:     Mental Status: He is alert. Mental status is at baseline.  Psychiatric:        Mood and Affect: Mood normal.        Behavior: Behavior normal.     ED Results / Procedures / Treatments   Labs (all labs ordered are listed, but only abnormal results are displayed) Labs Reviewed  CBC - Abnormal; Notable for the following components:      Result Value   MCV 76.6 (*)    MCH 24.9 (*)    All other components within normal limits  BASIC METABOLIC PANEL - Abnormal; Notable for the following components:   Glucose, Bld 116 (*)    All other components within normal limits  D-DIMER, QUANTITATIVE - Abnormal; Notable for the following components:   D-Dimer,  Quant 0.54 (*)    All other components within normal limits  TROPONIN I (HIGH SENSITIVITY)  TROPONIN I (HIGH SENSITIVITY)    EKG EKG Interpretation  Date/Time:  Sunday November 16 2022 11:01:52 EST Ventricular Rate:  60 PR Interval:  150 QRS Duration: 90 QT Interval:  454 QTC Calculation: 454 R Axis:   80 Text Interpretation: Sinus rhythm Confirmed by Vonita Moss [phone]) on 11/16/2022 11:03:57 AM  Radiology DG Chest Port 1 View  Result Date: 11/16/2022 CLINICAL DATA:  Shortness of breath EXAM: PORTABLE CHEST 1 VIEW COMPARISON:  None Available. FINDINGS: The heart size and mediastinal contours are within normal limits. No focal airspace consolidation, pleural effusion, or pneumothorax. The visualized skeletal structures are unremarkable. IMPRESSION: No active disease. Electronically Signed   By: Duanne Guess D.O.   On: 11/16/2022 09:15    Procedures Procedures   Medications Ordered in ED Medications - No data to display  ED Course/ Medical Decision Making/ A&P Clinical Course as of 11/16/22 2111  [facility] Nov 16, 2022  1128 Dr Wyline Mood will arrange for op stress [RP]    Clinical Course User Index [RP] Rondel Baton, MD                           Medical Decision Making Amount and/or Complexity of Data Reviewed Labs: ordered. Radiology: ordered.   Tyler James is a 61 y.o. male with comorbidities that complicate the patient evaluation including CAD status post PCI, HTN, HLD, and ischemic cardiomyopathy with an EF of 45 to 50% who presents to the emergency department with shortness of breath.   Initial Ddx:  MI, PE, URI, pneumonia  MDM:  Unclear what caused the patient's transient symptoms.  Ischemia possible so we will obtain EKG and troponins.  Also considering PE so we will send dimer.  No symptoms of a URI or pneumonia at this time.  Plan:  Labs Chest x-ray D-dimer Troponin EKG  ED Summary/Re-evaluation:  Underwent the above workup which showed  normal serial troponins.  No recurrence of his shortness of breath or chest pain while in the emergency department upon my reevaluation.  D-dimer was elevated but within age-adjusted limits so CTA not indicated.  EKG did initially show some ST depressions which normalized on the second EKG.  Discussed with cardiology these EKG changes and patient's presentation.  They felt that with his serial troponins being WNL that it was unlikely  that he was having an MI at this time but should have outpatient stress testing.  They will arrange for him to come in for a stress test.  I relayed this information to the patient and also informed him to follow-up with his primary doctor.  This patient presents to the ED for concern of complaints listed in HPI, this involves an extensive number of treatment options, and is a complaint that carries with it a high risk of complications and morbidity. Disposition including potential need for admission considered.   Dispo: DC Home. Return precautions discussed including, but not limited to, those listed in the AVS. Allowed pt time to ask questions which were answered fully prior to dc.  Records reviewed Outpatient Clinic Notes The following labs were independently interpreted: Serial Troponins and show no acute abnormality I independently reviewed the following imaging with scope of interpretation limited to determining acute life threatening conditions related to emergency care: Chest x-ray and agree with the radiologist interpretation with the following exceptions: None I personally reviewed and interpreted cardiac monitoring: normal sinus rhythm  I personally reviewed and interpreted the pt's EKG: see above for interpretation  I have reviewed the patients home medications and made adjustments as needed Consults: Cardiology Social Determinants of health:  None  Final Clinical Impression(s) / ED Diagnoses Final diagnoses:  SOB (shortness of breath)  EKG abnormalities     Rx / DC Orders ED Discharge Orders     None         Fransico Meadow, MD 11/16/22 2111

## 2022-11-18 NOTE — Telephone Encounter (Signed)
Order entered for exercise myoview. Patient instructed to HOLD Metoprolol the am of test and NPO six hours before test.

## 2022-11-19 DIAGNOSIS — M25551 Pain in right hip: Secondary | ICD-10-CM | POA: Diagnosis not present

## 2022-11-19 NOTE — H&P (Signed)
HIP ARTHROPLASTY ADMISSION H&P  Patient ID: Tyler James MRN: [id number] DOB/AGE: June 14, 1961 62 y.o.  Chief Complaint: right hip pain.  Planned Procedure Date: 12/10/21 Medical Clearance by Dr. Corie Chiquito   Cardiac Clearance by Nicholes Rough, PA-C  HPI: Tyler James is a 62 y.o. male who presents for evaluation of FAILED RIGHT HIP RESURFACING. The patient has a history of pain and functional disability in the right hip and has failed non-surgical conservative treatments for greater than 12 weeks to include NSAID's and/or analgesics, use of assistive devices, and activity modification.  Onset of symptoms was abrupt, starting 6 months ago with rapidlly worsening course since that time. 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 He does not associate the pain with any trauma.  Patient currently rates pain at 5 out of 10 with activity. Patient has worsening of pain with activity and weight bearing and pain that interferes with activities of daily living.  Patient has evidence of femoral implant failure.   Past Medical History:  Diagnosis Date   CAD (coronary artery disease)    a. 02/2016: STEMI - 100% stenosis OM2 w/ DES placed, POBA to LCx, and staged PCI of 95% stenosed RCA w/ DES.   Ischemic cardiomyopathy    a. 02/2016: EF 45-50% w/ diffuse inferolateral hypokinesis   Past Surgical History:  Procedure Laterality Date   CARDIAC CATHETERIZATION N/A 03/10/2016   Procedure: Left Heart Cath and Coronary Angiography;  Surgeon: Peter M Martinique, MD;  Location: [place] CV LAB;  Service: Cardiovascular;  Laterality: N/A;   CARDIAC CATHETERIZATION N/A 03/10/2016   Procedure: Coronary Stent Intervention;  Surgeon: Peter M Martinique, MD;  Location: [place] CV LAB;  Service: Cardiovascular;  Laterality: N/A;   CARDIAC CATHETERIZATION N/A 03/12/2016   Procedure: Coronary Stent Intervention;  Surgeon: Burnell Blanks, MD;  Location:  [place] CV LAB;  Service: Cardiovascular;  Laterality: N/A;   TOTAL HIP ARTHROPLASTY     bilateral   Allergies  Allergen Reactions   Ibuprofen Swelling    Swelling of face   Prior to Admission medications   Medication Sig Start Date End Date Taking? Authorizing Provider  aspirin 81 MG chewable tablet Chew 1 tablet (81 mg total) by mouth daily. 03/13/16   Strader, Fransisco Hertz, PA-C  atorvastatin (LIPITOR) 40 MG tablet TAKE 1 TABLET BY MOUTH EVERY DAY 06/08/17   Arnoldo Lenis, MD  cyclobenzaprine (FLEXERIL) 10 MG tablet Take 10 mg by mouth daily at 12 noon.    [provider]  losartan (COZAAR) 25 MG tablet Take 25 mg by mouth daily.    [provider]  metoprolol succinate (TOPROL XL) 25 MG 24 hr tablet Take 1 tablet (25 mg total) by mouth daily. 10/24/22   Elgie Collard, PA-C  Multiple Vitamin (MULTIVITAMIN) tablet Take 1 tablet by mouth daily.    [provider]  nitroGLYCERIN (NITROSTAT) 0.4 MG SL tablet Place 1 tablet (0.4 mg total) under the tongue every 5 (five) minutes as needed for chest pain. 03/13/16   Erma Heritage, PA-C   Social History   Socioeconomic History   Marital status: Single    Spouse name: Not on file   Number of children: Not on file   Years of education: Not on file   Highest education level: Not on file  Occupational History   Occupation: currently unemployed.   Tobacco Use   Smoking status:  Every Day    Packs/day: 1.00    Types: Cigarettes   Smokeless tobacco: Never  Vaping Use   Vaping Use: Never used  Substance and Sexual Activity   Alcohol use: Yes    Comment: weekly   Drug use: No   Sexual activity: Yes  Other Topics Concern   Not on file  Social History Narrative   Not on file   Social Determinants of Health   Financial Resource Strain: Not on file  Food Insecurity: Not on file  Transportation Needs: Not on file  Physical Activity: Not on file  Stress: Not on file  Social Connections: Not on file    No family history on file.  ROS: Currently denies lightheadedness, dizziness, Fever, chills, CP, SOB. No personal history of DVT, PE, or CVA. No loose teeth or dentures All other systems have been reviewed and were otherwise currently negative with the exception of those mentioned in the HPI and as above.  Objective: Vitals: Ht: 5\' 1"  Wt: 166 lbs Temp: 98.1 BP: 146/76 Pulse: 64 O2 96% on room air.   Physical Exam: General: Alert, NAD. Trendelenberg Gait  HEENT: EOMI, Good Neck Extension  Pulm: No increased work of breathing.  Clear B/L A/P w/o crackle or wheeze.  CV: RRR, No m/g/r appreciated  GI: soft, NT, ND Neuro: Neuro without gross focal deficit.  Sensation intact distally Skin: No lesions in the area of chief complaint MSK/Surgical Site: right Hip pain with passive ROM and walking. Antalgic gait. Noticeable leg length difference right leg shorter than left. NVI.    Imaging Review Plain radiographs demonstrate femoral neck fracture with femoral implant failure  Preoperative templating of the joint replacement has been completed, documented, and submitted to the Operating Room personnel in order to optimize intra-operative equipment management.  Assessment: Right hip resurfacing with a varus femoral stress fracture   Plan: Plan for Procedure(s): CONVERSION OF RIGHT HIP RESURFACING TO TOTAL HIP  The patient history, physical exam, clinical judgement of the provider and imaging are consistent with end stage degenerative joint disease and total joint arthroplasty is deemed medically necessary. The treatment options including medical management, injection therapy, and arthroplasty were discussed at length. The risks and benefits of Procedure(s): CONVERSION TO TOTAL HIP were presented and reviewed.  The risks of nonoperative treatment, versus surgical intervention including but not limited to continued pain, aseptic loosening, stiffness, dislocation/subluxation, infection,  bleeding, nerve injury, blood clots, cardiopulmonary complications, morbidity, mortality, among others were discussed. The patient verbalizes understanding and wishes to proceed with the plan.  Patient is being admitted for surgery, pain control, PT, prophylactic antibiotics, VTE prophylaxis, progressive ambulation, ADL's and discharge planning.   Dental prophylaxis discussed and recommended for 2 years postoperatively.  The patient does meet the criteria for TXA which will be used perioperatively.   The patient is planning to be discharged home with [facility]PT ([facility]) in care of family   Ventura Bruns, Hershal Coria 11/19/2022 11:07 AM

## 2022-11-24 ENCOUNTER — Ambulatory Visit (HOSPITAL_COMMUNITY)
Admission: RE | Admit: 2022-11-24 | Discharge: 2022-11-24 | Disposition: A | Discharge status: Home / Self Care | Payer: 59 | Source: Ambulatory Visit | Attending: Cardiology | Admitting: Cardiology

## 2022-11-24 ENCOUNTER — Inpatient Hospital Stay (HOSPITAL_BASED_OUTPATIENT_CLINIC_OR_DEPARTMENT_OTHER)
Admission: RE | Admit: 2022-11-24 | Discharge: 2022-11-24 | Disposition: A | Discharge status: Home / Self Care | Payer: 59 | Source: Ambulatory Visit | Attending: Internal Medicine | Admitting: Internal Medicine

## 2022-11-24 DIAGNOSIS — Z96643 Presence of artificial hip joint, bilateral: Secondary | ICD-10-CM | POA: Diagnosis not present

## 2022-11-24 DIAGNOSIS — R0602 Shortness of breath: Secondary | ICD-10-CM

## 2022-11-24 DIAGNOSIS — Z560 Unemployment, unspecified: Secondary | ICD-10-CM | POA: Diagnosis not present

## 2022-11-24 DIAGNOSIS — E876 Hypokalemia: Secondary | ICD-10-CM | POA: Diagnosis not present

## 2022-11-24 DIAGNOSIS — Z7982 Long term (current) use of aspirin: Secondary | ICD-10-CM | POA: Diagnosis not present

## 2022-11-24 DIAGNOSIS — I2121 ST elevation (STEMI) myocardial infarction involving left circumflex coronary artery: Secondary | ICD-10-CM | POA: Diagnosis not present

## 2022-11-24 DIAGNOSIS — E785 Hyperlipidemia, unspecified: Secondary | ICD-10-CM | POA: Diagnosis not present

## 2022-11-24 DIAGNOSIS — I110 Hypertensive heart disease with heart failure: Secondary | ICD-10-CM | POA: Diagnosis not present

## 2022-11-24 DIAGNOSIS — Z955 Presence of coronary angioplasty implant and graft: Secondary | ICD-10-CM | POA: Diagnosis not present

## 2022-11-24 DIAGNOSIS — I2510 Atherosclerotic heart disease of native coronary artery without angina pectoris: Secondary | ICD-10-CM | POA: Diagnosis not present

## 2022-11-24 DIAGNOSIS — I213 ST elevation (STEMI) myocardial infarction of unspecified site: Secondary | ICD-10-CM | POA: Diagnosis not present

## 2022-11-24 DIAGNOSIS — R0789 Other chest pain: Secondary | ICD-10-CM | POA: Diagnosis not present

## 2022-11-24 DIAGNOSIS — I5022 Chronic systolic (congestive) heart failure: Secondary | ICD-10-CM | POA: Diagnosis not present

## 2022-11-24 DIAGNOSIS — I252 Old myocardial infarction: Secondary | ICD-10-CM | POA: Diagnosis not present

## 2022-11-24 DIAGNOSIS — Z79899 Other long term (current) drug therapy: Secondary | ICD-10-CM | POA: Diagnosis not present

## 2022-11-24 DIAGNOSIS — I255 Ischemic cardiomyopathy: Secondary | ICD-10-CM | POA: Diagnosis not present

## 2022-11-24 DIAGNOSIS — F17210 Nicotine dependence, cigarettes, uncomplicated: Secondary | ICD-10-CM | POA: Diagnosis not present

## 2022-11-24 MED ORDER — SODIUM CHLORIDE FLUSH 0.9 % IV SOLN
INTRAVENOUS | Status: AC
  Administered 2022-11-24: 10 mL via INTRAVENOUS
  Filled 2022-11-24: qty 10

## 2022-11-24 MED ORDER — REGADENOSON 0.4 MG/5ML IV SOLN
INTRAVENOUS | Status: AC
  Administered 2022-11-24: 0.4 mg via INTRAVENOUS
  Filled 2022-11-24: qty 5

## 2022-11-24 MED ORDER — TECHNETIUM TC 99M TETROFOSMIN IV KIT
30.0000 | PACK | Freq: Once | INTRAVENOUS | Status: AC | PRN
  Administered 2022-11-24: 32.4 via INTRAVENOUS

## 2022-11-24 MED ORDER — TECHNETIUM TC 99M TETROFOSMIN IV KIT
10.0000 | PACK | Freq: Once | INTRAVENOUS | Status: AC | PRN
  Administered 2022-11-24: 10.18 via INTRAVENOUS

## 2022-11-25 LAB — NM MYOCAR MULTI W/SPECT W/WALL MOTION / EF
Base ST Depression (mm): 1 mm
LV dias vol: 97 mL (ref 62–150)
LV sys vol: 45 mL
Nuc Stress EF: 54 %
Peak HR: 87 {beats}/min
RATE: 0.3
Rest HR: 57 {beats}/min
Rest Nuclear Isotope Dose: 10.2 mCi
SDS: 7
SRS: 3
SSS: 10
Stress Nuclear Isotope Dose: 32.4 mCi
TID: 1

## 2022-11-26 ENCOUNTER — Inpatient Hospital Stay (HOSPITAL_COMMUNITY)
Admission: EM | Admit: 2022-11-26 | Discharge: 2022-11-27 | DRG: 322 | Disposition: A | Discharge status: Home / Self Care | Payer: 59 | Attending: Cardiology | Admitting: Cardiology

## 2022-11-26 ENCOUNTER — Encounter (HOSPITAL_COMMUNITY): Payer: Self-pay

## 2022-11-26 ENCOUNTER — Inpatient Hospital Stay (HOSPITAL_COMMUNITY): Payer: 59

## 2022-11-26 ENCOUNTER — Emergency Department (HOSPITAL_COMMUNITY): Payer: 59

## 2022-11-26 ENCOUNTER — Other Ambulatory Visit (HOSPITAL_COMMUNITY): Payer: Self-pay

## 2022-11-26 ENCOUNTER — Other Ambulatory Visit: Payer: Self-pay

## 2022-11-26 ENCOUNTER — Encounter (HOSPITAL_COMMUNITY)
Admission: EM | Disposition: A | Discharge status: Home / Self Care | Payer: Self-pay | Source: Home / Self Care | Attending: Cardiology

## 2022-11-26 DIAGNOSIS — I110 Hypertensive heart disease with heart failure: Secondary | ICD-10-CM | POA: Diagnosis not present

## 2022-11-26 DIAGNOSIS — I255 Ischemic cardiomyopathy: Secondary | ICD-10-CM | POA: Diagnosis present

## 2022-11-26 DIAGNOSIS — I2121 ST elevation (STEMI) myocardial infarction involving left circumflex coronary artery: Secondary | ICD-10-CM | POA: Diagnosis not present

## 2022-11-26 DIAGNOSIS — Z96643 Presence of artificial hip joint, bilateral: Secondary | ICD-10-CM | POA: Diagnosis present

## 2022-11-26 DIAGNOSIS — Z955 Presence of coronary angioplasty implant and graft: Secondary | ICD-10-CM | POA: Diagnosis not present

## 2022-11-26 DIAGNOSIS — E876 Hypokalemia: Secondary | ICD-10-CM | POA: Diagnosis not present

## 2022-11-26 DIAGNOSIS — I213 ST elevation (STEMI) myocardial infarction of unspecified site: Secondary | ICD-10-CM | POA: Diagnosis not present

## 2022-11-26 DIAGNOSIS — I10 Essential (primary) hypertension: Secondary | ICD-10-CM | POA: Insufficient documentation

## 2022-11-26 DIAGNOSIS — Z79899 Other long term (current) drug therapy: Secondary | ICD-10-CM | POA: Diagnosis not present

## 2022-11-26 DIAGNOSIS — I25110 Atherosclerotic heart disease of native coronary artery with unstable angina pectoris: Secondary | ICD-10-CM | POA: Diagnosis not present

## 2022-11-26 DIAGNOSIS — I2119 ST elevation (STEMI) myocardial infarction involving other coronary artery of inferior wall: Secondary | ICD-10-CM

## 2022-11-26 DIAGNOSIS — R0789 Other chest pain: Secondary | ICD-10-CM | POA: Diagnosis present

## 2022-11-26 DIAGNOSIS — R079 Chest pain, unspecified: Secondary | ICD-10-CM | POA: Diagnosis not present

## 2022-11-26 DIAGNOSIS — Z743 Need for continuous supervision: Secondary | ICD-10-CM | POA: Diagnosis not present

## 2022-11-26 DIAGNOSIS — Z720 Tobacco use: Secondary | ICD-10-CM | POA: Diagnosis present

## 2022-11-26 DIAGNOSIS — I252 Old myocardial infarction: Secondary | ICD-10-CM

## 2022-11-26 DIAGNOSIS — R6889 Other general symptoms and signs: Secondary | ICD-10-CM | POA: Diagnosis not present

## 2022-11-26 DIAGNOSIS — Z7982 Long term (current) use of aspirin: Secondary | ICD-10-CM | POA: Diagnosis not present

## 2022-11-26 DIAGNOSIS — Z560 Unemployment, unspecified: Secondary | ICD-10-CM

## 2022-11-26 DIAGNOSIS — I5022 Chronic systolic (congestive) heart failure: Secondary | ICD-10-CM | POA: Diagnosis present

## 2022-11-26 DIAGNOSIS — F17210 Nicotine dependence, cigarettes, uncomplicated: Secondary | ICD-10-CM | POA: Diagnosis present

## 2022-11-26 DIAGNOSIS — E785 Hyperlipidemia, unspecified: Secondary | ICD-10-CM | POA: Diagnosis present

## 2022-11-26 DIAGNOSIS — I499 Cardiac arrhythmia, unspecified: Secondary | ICD-10-CM | POA: Diagnosis not present

## 2022-11-26 DIAGNOSIS — I2510 Atherosclerotic heart disease of native coronary artery without angina pectoris: Secondary | ICD-10-CM | POA: Diagnosis present

## 2022-11-26 HISTORY — PX: LEFT HEART CATH AND CORONARY ANGIOGRAPHY: CATH118249

## 2022-11-26 HISTORY — PX: CORONARY/GRAFT ACUTE MI REVASCULARIZATION: CATH118305

## 2022-11-26 LAB — BASIC METABOLIC PANEL
Anion gap: 9 (ref 5–15)
BUN: 17 mg/dL (ref 8–23)
CO2: 22 mmol/L (ref 22–32)
Calcium: 8.7 mg/dL — ABNORMAL LOW (ref 8.9–10.3)
Chloride: 105 mmol/L (ref 98–111)
Creatinine, Ser: 0.93 mg/dL (ref 0.61–1.24)
GFR, Estimated: >60 mL/min (ref >60)
Glucose, Bld: 111 mg/dL — ABNORMAL HIGH (ref 70–99)
Potassium: 4.1 mmol/L (ref 3.5–5.1)
Sodium: 136 mmol/L (ref 135–145)

## 2022-11-26 LAB — COMPREHENSIVE METABOLIC PANEL
ALT: 43 U/L (ref 0–44)
AST: 34 U/L (ref 15–41)
Albumin: 3.8 g/dL (ref 3.5–5.0)
Alkaline Phosphatase: 62 U/L (ref 38–126)
Anion gap: 8 (ref 5–15)
BUN: 21 mg/dL (ref 8–23)
CO2: 25 mmol/L (ref 22–32)
Calcium: 8.7 mg/dL — ABNORMAL LOW (ref 8.9–10.3)
Chloride: 105 mmol/L (ref 98–111)
Creatinine, Ser: 0.98 mg/dL (ref 0.61–1.24)
GFR, Estimated: >60 mL/min (ref >60)
Glucose, Bld: 129 mg/dL — ABNORMAL HIGH (ref 70–99)
Potassium: 3.2 mmol/L — ABNORMAL LOW (ref 3.5–5.1)
Sodium: 138 mmol/L (ref 135–145)
Total Bilirubin: 0.7 mg/dL (ref 0.3–1.2)
Total Protein: 6.7 g/dL (ref 6.5–8.1)

## 2022-11-26 LAB — HEMOGLOBIN A1C
Hgb A1c MFr Bld: 5.9 % — ABNORMAL HIGH (ref 4.8–5.6)
Mean Plasma Glucose: 122.63 mg/dL

## 2022-11-26 LAB — APTT: aPTT: 28 seconds (ref 24–36)

## 2022-11-26 LAB — CBC WITH DIFFERENTIAL/PLATELET
Abs Immature Granulocytes: 0.02 10*3/uL (ref 0.00–0.07)
Basophils Absolute: 0.1 10*3/uL (ref 0.0–0.1)
Basophils Relative: 1 %
Eosinophils Absolute: 0.3 10*3/uL (ref 0.0–0.5)
Eosinophils Relative: 3 %
HCT: 42 % (ref 39.0–52.0)
Hemoglobin: 13.3 g/dL (ref 13.0–17.0)
Immature Granulocytes: 0 %
Lymphocytes Relative: 52 %
Lymphs Abs: 4.8 10*3/uL — ABNORMAL HIGH (ref 0.7–4.0)
MCH: 24.4 pg — ABNORMAL LOW (ref 26.0–34.0)
MCHC: 31.7 g/dL (ref 30.0–36.0)
MCV: 77.2 fL — ABNORMAL LOW (ref 80.0–100.0)
Monocytes Absolute: 1 10*3/uL (ref 0.1–1.0)
Monocytes Relative: 10 %
Neutro Abs: 3.1 10*3/uL (ref 1.7–7.7)
Neutrophils Relative %: 34 %
Platelets: 216 10*3/uL (ref 150–400)
RBC: 5.44 MIL/uL (ref 4.22–5.81)
RDW: 14.3 % (ref 11.5–15.5)
WBC: 9.3 10*3/uL (ref 4.0–10.5)
nRBC: 0 % (ref 0.0–0.2)

## 2022-11-26 LAB — ECHOCARDIOGRAM COMPLETE
Area-P 1/2: 5.84 cm2
Est EF: 55
Height: 69 in
MV M vel: 0.89 m/s
MV Peak grad: 3.1 mmHg
S' Lateral: 2.9 cm
Weight: 2640 oz

## 2022-11-26 LAB — TROPONIN I (HIGH SENSITIVITY)
Troponin I (High Sensitivity): 11 ng/L (ref <18)
Troponin I (High Sensitivity): 2975 ng/L (ref <18)
Troponin I (High Sensitivity): 4528 ng/L (ref <18)

## 2022-11-26 LAB — PROTIME-INR
INR: 1 (ref 0.8–1.2)
Prothrombin Time: 13 seconds (ref 11.4–15.2)

## 2022-11-26 LAB — LIPID PANEL
Cholesterol: 128 mg/dL (ref 0–200)
Cholesterol: 117 mg/dL (ref 0–200)
HDL: 50 mg/dL (ref >40)
HDL: 53 mg/dL (ref >40)
LDL Cholesterol: 55 mg/dL (ref 0–99)
LDL Cholesterol: 53 mg/dL (ref 0–99)
Total CHOL/HDL Ratio: 2.6 RATIO
Total CHOL/HDL Ratio: 2.2 RATIO
Triglycerides: 117 mg/dL (ref <150)
Triglycerides: 54 mg/dL (ref <150)
VLDL: 23 mg/dL (ref 0–40)
VLDL: 11 mg/dL (ref 0–40)

## 2022-11-26 LAB — POCT ACTIVATED CLOTTING TIME
Activated Clotting Time: 234 seconds
Activated Clotting Time: 266 seconds

## 2022-11-26 LAB — CBC
HCT: 40.5 % (ref 39.0–52.0)
Hemoglobin: 12.9 g/dL — ABNORMAL LOW (ref 13.0–17.0)
MCH: 24.3 pg — ABNORMAL LOW (ref 26.0–34.0)
MCHC: 31.9 g/dL (ref 30.0–36.0)
MCV: 76.4 fL — ABNORMAL LOW (ref 80.0–100.0)
Platelets: 177 10*3/uL (ref 150–400)
RBC: 5.3 MIL/uL (ref 4.22–5.81)
RDW: 14 % (ref 11.5–15.5)
WBC: 7.4 10*3/uL (ref 4.0–10.5)
nRBC: 0 % (ref 0.0–0.2)

## 2022-11-26 LAB — MRSA NEXT GEN BY PCR, NASAL: MRSA by PCR Next Gen: NOT DETECTED

## 2022-11-26 SURGERY — CORONARY/GRAFT ACUTE MI REVASCULARIZATION
Anesthesia: LOCAL

## 2022-11-26 MED ORDER — VERAPAMIL HCL 2.5 MG/ML IV SOLN
INTRAVENOUS | Status: DC | PRN
  Administered 2022-11-26: 10 mL via INTRA_ARTERIAL

## 2022-11-26 MED ORDER — SODIUM CHLORIDE 0.9 % IV SOLN
INTRAVENOUS | Status: AC | PRN
  Administered 2022-11-26: 10 mL/h via INTRAVENOUS

## 2022-11-26 MED ORDER — POTASSIUM CHLORIDE 10 MEQ/100ML IV SOLN
INTRAVENOUS | Status: AC
  Filled 2022-11-26: qty 100

## 2022-11-26 MED ORDER — LIDOCAINE HCL (PF) 1 % IJ SOLN
INTRAMUSCULAR | Status: AC
  Filled 2022-11-26: qty 30

## 2022-11-26 MED ORDER — HEPARIN SODIUM (PORCINE) 1000 UNIT/ML IJ SOLN
INTRAMUSCULAR | Status: DC | PRN
  Administered 2022-11-26: 4000 [IU] via INTRAVENOUS
  Administered 2022-11-26: 3000 [IU] via INTRAVENOUS
  Administered 2022-11-26: 2000 [IU] via INTRAVENOUS

## 2022-11-26 MED ORDER — TICAGRELOR 90 MG PO TABS: 90.0000 mg | ORAL_TABLET | Freq: Two times a day (BID) | ORAL | Status: DC

## 2022-11-26 MED ORDER — SODIUM CHLORIDE 0.9% FLUSH
3.0000 mL | Freq: Two times a day (BID) | INTRAVENOUS | Status: DC
  Administered 2022-11-26 – 2022-11-27 (×3): 3 mL via INTRAVENOUS

## 2022-11-26 MED ORDER — MIDAZOLAM HCL 2 MG/2ML IJ SOLN
INTRAMUSCULAR | Status: DC | PRN
  Administered 2022-11-26: 1 mg via INTRAVENOUS

## 2022-11-26 MED ORDER — SODIUM CHLORIDE 0.9% FLUSH: 3.0000 mL | INTRAVENOUS | Status: DC | PRN

## 2022-11-26 MED ORDER — HEPARIN SODIUM (PORCINE) 1000 UNIT/ML IJ SOLN
INTRAMUSCULAR | Status: AC
  Filled 2022-11-26: qty 10

## 2022-11-26 MED ORDER — TICAGRELOR 90 MG PO TABS
ORAL_TABLET | ORAL | Status: DC | PRN
  Administered 2022-11-26: 180 mg via ORAL

## 2022-11-26 MED ORDER — SODIUM CHLORIDE 0.9 % IV SOLN: 250.0000 mL | INTRAVENOUS | Status: DC | PRN

## 2022-11-26 MED ORDER — ORAL CARE MOUTH RINSE: 15.0000 mL | OROMUCOSAL | Status: DC | PRN

## 2022-11-26 MED ORDER — ATORVASTATIN CALCIUM 80 MG PO TABS
80.0000 mg | ORAL_TABLET | Freq: Every day | ORAL | Status: DC
  Administered 2022-11-26 – 2022-11-27 (×2): 80 mg via ORAL
  Filled 2022-11-26 (×2): qty 1

## 2022-11-26 MED ORDER — HEPARIN BOLUS VIA INFUSION: 4000.0000 [IU] | Freq: Once | INTRAVENOUS | Status: DC

## 2022-11-26 MED ORDER — LIDOCAINE HCL (PF) 1 % IJ SOLN
INTRAMUSCULAR | Status: DC | PRN
  Administered 2022-11-26: 2 mL

## 2022-11-26 MED ORDER — POTASSIUM CHLORIDE 10 MEQ/100ML IV SOLN
INTRAVENOUS | Status: AC | PRN
  Administered 2022-11-26: 10 meq via INTRAVENOUS

## 2022-11-26 MED ORDER — HYDRALAZINE HCL 20 MG/ML IJ SOLN: 10.0000 mg | INTRAMUSCULAR | Status: AC | PRN

## 2022-11-26 MED ORDER — HEPARIN SODIUM (PORCINE) 5000 UNIT/ML IJ SOLN
4000.0000 [IU] | Freq: Once | INTRAMUSCULAR | Status: AC
  Administered 2022-11-26: 4000 [IU] via INTRAVENOUS
  Filled 2022-11-26: qty 1

## 2022-11-26 MED ORDER — POTASSIUM CHLORIDE 10 MEQ/50ML IV SOLN
INTRAVENOUS | Status: AC
  Filled 2022-11-26: qty 50

## 2022-11-26 MED ORDER — HEPARIN (PORCINE) IN NACL 1000-0.9 UT/500ML-% IV SOLN
INTRAVENOUS | Status: AC
  Filled 2022-11-26: qty 1000

## 2022-11-26 MED ORDER — TICAGRELOR 90 MG PO TABS
90.0000 mg | ORAL_TABLET | Freq: Two times a day (BID) | ORAL | Status: DC
  Administered 2022-11-26 – 2022-11-27 (×3): 90 mg via ORAL
  Filled 2022-11-26 (×3): qty 1

## 2022-11-26 MED ORDER — ASPIRIN 81 MG PO CHEW
81.0000 mg | CHEWABLE_TABLET | Freq: Every day | ORAL | Status: DC
  Administered 2022-11-26 – 2022-11-27 (×2): 81 mg via ORAL
  Filled 2022-11-26 (×2): qty 1

## 2022-11-26 MED ORDER — SODIUM CHLORIDE 0.9 % IV SOLN: INTRAVENOUS | Status: DC

## 2022-11-26 MED ORDER — ONDANSETRON HCL 4 MG/2ML IJ SOLN
4.0000 mg | Freq: Four times a day (QID) | INTRAMUSCULAR | Status: DC | PRN
  Administered 2022-11-26: 4 mg via INTRAVENOUS
  Filled 2022-11-26: qty 2

## 2022-11-26 MED ORDER — TICAGRELOR 90 MG PO TABS
ORAL_TABLET | ORAL | Status: AC
  Filled 2022-11-26: qty 1

## 2022-11-26 MED ORDER — METOPROLOL SUCCINATE ER 25 MG PO TB24
25.0000 mg | ORAL_TABLET | Freq: Every day | ORAL | Status: DC
  Filled 2022-11-26: qty 1

## 2022-11-26 MED ORDER — IOHEXOL 350 MG/ML SOLN: INTRAVENOUS | Status: DC | PRN

## 2022-11-26 MED ORDER — MIDAZOLAM HCL 2 MG/2ML IJ SOLN
INTRAMUSCULAR | Status: AC
  Filled 2022-11-26: qty 2

## 2022-11-26 MED ORDER — VERAPAMIL HCL 2.5 MG/ML IV SOLN
INTRAVENOUS | Status: AC
  Filled 2022-11-26: qty 2

## 2022-11-26 MED ORDER — LOSARTAN POTASSIUM 25 MG PO TABS
25.0000 mg | ORAL_TABLET | Freq: Every day | ORAL | Status: DC
  Administered 2022-11-26 – 2022-11-27 (×2): 25 mg via ORAL
  Filled 2022-11-26 (×3): qty 1

## 2022-11-26 MED ORDER — ACETAMINOPHEN 325 MG PO TABS
650.0000 mg | ORAL_TABLET | ORAL | Status: DC | PRN
  Administered 2022-11-26 – 2022-11-27 (×2): 650 mg via ORAL
  Filled 2022-11-26 (×2): qty 2

## 2022-11-26 MED ORDER — HEPARIN (PORCINE) IN NACL 1000-0.9 UT/500ML-% IV SOLN
INTRAVENOUS | Status: DC | PRN
  Administered 2022-11-26 (×2): 500 mL

## 2022-11-26 MED ORDER — SODIUM CHLORIDE 0.9 % IV SOLN: INTRAVENOUS | Status: AC

## 2022-11-26 MED ORDER — LABETALOL HCL 5 MG/ML IV SOLN: 10.0000 mg | INTRAVENOUS | Status: AC | PRN

## 2022-11-26 MED ORDER — FENTANYL CITRATE (PF) 100 MCG/2ML IJ SOLN
INTRAMUSCULAR | Status: AC
  Filled 2022-11-26: qty 2

## 2022-11-26 MED ORDER — IOHEXOL 350 MG/ML SOLN
INTRAVENOUS | Status: DC | PRN
  Administered 2022-11-26: 125 mL via INTRA_ARTERIAL

## 2022-11-26 MED ORDER — ASPIRIN 81 MG PO CHEW
324.0000 mg | CHEWABLE_TABLET | Freq: Once | ORAL | Status: AC
  Administered 2022-11-26: 324 mg via ORAL
  Filled 2022-11-26: qty 4

## 2022-11-26 MED ORDER — FENTANYL CITRATE (PF) 100 MCG/2ML IJ SOLN
INTRAMUSCULAR | Status: DC | PRN
  Administered 2022-11-26: 25 ug via INTRAVENOUS

## 2022-11-26 MED ORDER — HEPARIN SODIUM (PORCINE) 5000 UNIT/ML IJ SOLN
5000.0000 [IU] | Freq: Three times a day (TID) | INTRAMUSCULAR | Status: DC
  Administered 2022-11-26 – 2022-11-27 (×2): 5000 [IU] via SUBCUTANEOUS
  Filled 2022-11-26 (×2): qty 1

## 2022-11-26 MED ORDER — NITROGLYCERIN 0.4 MG SL SUBL: 0.4000 mg | SUBLINGUAL_TABLET | SUBLINGUAL | Status: DC | PRN

## 2022-11-26 SURGICAL SUPPLY — 23 items
BALL SAPPHIRE NC24 4.5X10 (BALLOONS) ×1
BALLN SAPPHIRE 3.5X15 (BALLOONS) ×1
BALLOON SAPPHIRE 3.5X15 (BALLOONS) IMPLANT
BALLOON SAPPHIRE NC24 4.5X10 (BALLOONS) IMPLANT
CATH INFINITI 5FR ANG PIGTAIL (CATHETERS) IMPLANT
CATH LAUNCHER 6FR EBU3.5 (CATHETERS) IMPLANT
CATH OPTITORQUE TIG 4.0 5F (CATHETERS) IMPLANT
DEVICE RAD COMP TR BAND LRG (VASCULAR PRODUCTS) IMPLANT
GLIDESHEATH SLEND SS 6F .021 (SHEATH) IMPLANT
GUIDEWIRE INQWIRE 1.5J.035X260 (WIRE) IMPLANT
INQWIRE 1.5J .035X260CM (WIRE) ×1
KIT ENCORE 26 ADVANTAGE (KITS) IMPLANT
KIT HEART LEFT (KITS) ×1 IMPLANT
PACK CARDIAC CATHETERIZATION (CUSTOM PROCEDURE TRAY) ×1 IMPLANT
PROTECTION STATION PRESSURIZED (MISCELLANEOUS) ×1
SHEATH PROBE COVER 6X72 (BAG) IMPLANT
STATION PROTECTION PRESSURIZED (MISCELLANEOUS) IMPLANT
STENT SYNERGY XD 4.0X20 (Permanent Stent) IMPLANT
SYNERGY XD 4.0X20 (Permanent Stent) ×1 IMPLANT
TRANSDUCER W/STOPCOCK (MISCELLANEOUS) ×1 IMPLANT
TUBING CIL FLEX 10 FLL-RA (TUBING) ×1 IMPLANT
WIRE ASAHI PROWATER 180CM (WIRE) IMPLANT
WIRE HI TORQ BMW 190CM (WIRE) IMPLANT

## 2022-11-26 NOTE — ED Notes (Signed)
Pt BIB [facility] from [facility] for cath lab d/t STEMI. Cardiologist at bedside and stated cath lab is ready for pt. EMS bringing pt up to cath lab. Pt received 324 ASA and 4000units of heparin. VSS, AO4

## 2022-11-26 NOTE — Progress Notes (Addendum)
Rounding Note    Patient Name: Tyler James Date of Encounter: 11/26/2022  [facility] Cardiologist: Carlyle Dolly, MD   Subjective   Pt is doing well this morning overall. He did not sleep well but his bed is not comfortable and he has multiple monitoring devices wired to him. He has some mild intermittent chest discomfort which he thinks may be a sore throat. No aggravating or alleviating factors. Does well lying flat. No dyspnea.   Inpatient Medications    Scheduled Meds:  aspirin  81 mg Oral Daily   atorvastatin  80 mg Oral Daily   heparin  5,000 Units Subcutaneous Q8H   losartan  25 mg Oral Daily   sodium chloride flush  3 mL Intravenous Q12H   ticagrelor  90 mg Oral BID   Continuous Infusions:  sodium chloride 20 mL/hr at 11/26/22 0500   sodium chloride     potassium chloride     PRN Meds: sodium chloride, acetaminophen, nitroGLYCERIN, ondansetron (ZOFRAN) IV, mouth rinse, potassium chloride, sodium chloride flush   Vital Signs    Vitals:   11/26/22 0600 11/26/22 0700 11/26/22 0800 11/26/22 0900  BP: 109/69 127/64 122/84 120/68  Pulse: (!) 58 60 61 (!) 54  Resp: 18 17 (!) 24 (!) 23  Temp:  97.8 F (36.6 C)    TempSrc:  Oral    SpO2: 99% 99% 99% 99%  Weight:      Height:        Intake/Output Summary (Last 24 hours) at 11/26/2022 1056 Last data filed at 11/26/2022 0930 Gross per 24 hour  Intake 398.16 ml  Output 950 ml  Net -551.84 ml      11/26/2022   12:35 AM 11/16/2022    8:38 AM 10/24/2022    2:05 PM  Last 3 Weights  Weight (lbs) 165 lb 165 lb 160 lb 3.2 oz  Weight (kg) 74.844 kg 74.844 kg 72.666 kg      Telemetry    Sinus rhythm with rates 55-65 predominately - Personally Reviewed  ECG    NSR with no ST changes, improved from prior - Personally Reviewed  Physical Exam   GEN: No acute distress.   Neck: No JVD Cardiac: RRR, no murmurs, rubs, or gallops.  Respiratory: Clear to auscultation bilaterally. GI: Soft, nontender,  non-distended  MS: No edema; No deformity. Neuro:  Nonfocal  Psych: Normal affect   Labs    High Sensitivity Troponin:   Recent Labs  Lab 11/16/22 0951 11/16/22 1031 11/26/22 0043 11/26/22 0533 11/26/22 0723  TROPONINIHS 10 11 11  2,975* 4,528*     Chemistry Recent Labs  Lab 11/26/22 0043 11/26/22 0332  NA 138 136  K 3.2* 4.1  CL 105 105  CO2 25 22  GLUCOSE 129* 111*  BUN 21 17  CREATININE 0.98 0.93  CALCIUM 8.7* 8.7*  PROT 6.7  --   ALBUMIN 3.8  --   AST 34  --   ALT 43  --   ALKPHOS 62  --   BILITOT 0.7  --   GFRNONAA >60 >60  ANIONGAP 8 9    Lipids  Recent Labs  Lab 11/26/22 0336  CHOL 117  TRIG 54  HDL 53  LDLCALC 53  CHOLHDL 2.2    Hematology Recent Labs  Lab 11/26/22 0043 11/26/22 0336  WBC 9.3 7.4  RBC 5.44 5.30  HGB 13.3 12.9*  HCT 42.0 40.5  MCV 77.2* 76.4*  MCH 24.4* 24.3*  MCHC 31.7 31.9  RDW 14.3 14.0  PLT 216 177   Thyroid No results for input(s): "TSH", "FREET4" in the last 168 hours.  BNPNo results for input(s): "BNP", "PROBNP" in the last 168 hours.  DDimer No results for input(s): "DDIMER" in the last 168 hours.   Radiology    CARDIAC CATHETERIZATION  Result Date: 11/26/2022   CULPRIT LESION IN SEGMENT: Ost Cx lesion is 75% stenosed.  Ost Cx to Prox Cx lesion is 95% stenosed involving the distal edge of the previously placed stent..  The distal two thirds of the stent was widely patent.   A drug-eluting stent was successfully placed using a SYNERGY XD 4.0X20. ->  Postdilated to 4.5 mm.  Post intervention, there is a 0% residual stenosis.   1st Mrg lesion is 85% stenosed.   LPAV lesion is 75% stenosed.   Mid LM to Prox LAD lesion is 5% stenosed.  Mid LAD lesion is 50% stenosed.   Previously placed Prox RCA stent of unknown type is  widely patent.   ----------------------------------------------------   There is mild left ventricular systolic dysfunction.   LV end diastolic pressure is moderately elevated.   The left ventricular  ejection fraction is 45-50% by visual estimate.   There is no aortic valve stenosis. POST-CATH DIAGNOSES: Culprit Lesion: Proximal LCx 95% stenosis (irregular/ulcerated involving proximal stent edge) associated with ostial 75% stenosis Successful DES PCI covering both lesions overlapping the previous stent with Synergy DES 4.0 x 20 mm postdilated to 4.5 mm. Known 85% stenosis of OM1, and previous 55% stenosis of jailed ostial AV groove LCx treated with PTCA at time of original stent Widely patent RCA stent Mild diffuse disease in the LAD with calcification in the proximal segment with moderate stenosis just after heavily diseased main diagonal branch. Mildly reduced LV function with likely inferolateral hypokinesis. RECOMMENDATIONS He will be admitted for standard post PCI care.  => Cycle troponins, check FLP, CMP and A1c Beta-blocker, ARB, high-dose statin. Would recommend 2D echo to get better assessment.  LVEDP was 20 mmHg Smoking cessation counseling Could consider FastTRACK discharge if stable. Bryan Lemma, MD  NM Myocar Multi W/Spect Izetta Dakin Motion / EF  Result Date: 11/25/2022   Down sloping ST depression 2 mm in the inferior leads (II, III and aVF) was noted during pharmacological stress (present at rest). ECG is positive for ischemia.   LV perfusion is abnormal. There is a small defect with mild reduction in uptake present in the mid to basal inferior location(s) that is partially reversible. There is abnormal wall motion in the defect area. Findings are consistent with prior myocardial infarction with peri-infarct ischemia. The study is low risk.   Left ventricular function is normal, 54%    Cardiac Studies   Echo 2017  - Left ventricle: The cavity size was normal. Wall thickness was    normal. Systolic function was mildly reduced. The estimated    ejection fraction was in the range of 45% to 50%. There is    hypokinesis of the inferolateral myocardium. Left ventricular    diastolic function  parameters were normal.  - Tricuspid valve: There was mild-moderate regurgitation.  - Pulmonary arteries: Systolic pressure was mildly increased. PA    peak pressure: 42 mm Hg (S).   Impressions:   - Hypokinesis of the inferior lateral wall with overall mildly    reduced LV function; redundant MV chordae; trace MR; mild to    moderate TR; mildly elevated pulmonary pressure.    Cath 2017   [facility]  1st Mrg lesion, 90% stenosed. Prox LAD lesion, 30% stenosed. Mid RCA lesion, 95% stenosed. Ost 2nd Mrg to 2nd Mrg lesion, 100% stenosed. Post intervention, there is a 0% residual stenosis. => Promus Premier DES 4.0 mm x 24 mm Prox Cx lesion, 50% stenosed. Post intervention, there is a 10% residual stenosis.   1. 2 vessel obstructive CAD. The culprit lesion is occlusion of a large OM2 branch. There is severe mid RCA stenosis. The first OM is very small. 2. Successful stenting of the second OM with DES. POBA of the distal LCx through the stent struts.   Cath 2017   Prox RCA lesion, 99% stenosed. Post intervention, there is a 0% residual stenosis.   1. Severe stenosis mid RCA 2. Successful PTCA/DES x 1 mid RCA (Promus Premier DES 2.25 mm  x 12 mm)   Cath 11/2022 Culprit Lesion: Proximal LCx 95% stenosis (irregular/ulcerated involving proximal stent edge) associated with ostial 75% stenosis Successful DES PCI covering both lesions overlapping the previous stent with Synergy DES 4.0 x 20 mm postdilated to 4.5 mm. Known 85% stenosis of OM1, and previous 55% stenosis of jailed ostial AV groove LCx treated with PTCA at time of original stent Widely patent RCA stent Mild diffuse disease in the LAD with calcification in the proximal segment with moderate stenosis just after heavily diseased main diagonal branch. Mildly reduced LV function with likely inferolateral hypokinesis.   Patient Profile     62 y.o. 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 S/p PCI to Lcx.  Assessment & Plan    STEMI s/p PCI of LCx -> did have 1 short run of sustained VF requiring defibrillation x 1 restoring sinus rhythm.  No further arrhythmias. - Echo pending - Lipid panel (LDL 53), Lpa pending  - hsTrop 2975 -> 4528 - ASA/Ticag - Metop held for low HR - Losartan 25 mg daily - Atorvastatin 80 mg daily   HTN - see above HLD - see above Tobacco use disorder Hypokalemia - replenished, K 3.2 -> 4.1  Transfer out of ICU and plan for discharge tomorrow if no changes.   For questions or updates, please contact [facility] [place] Please consult [email or web address] for contact info under        Signed, Johny Blamer, DO  11/26/2022, 10:56 AM   Internal Medicine Resident, PGY-1 Pager# [phone]   Patient seen, examined. Available data reviewed. Agree with findings, assessment, and plan as outlined by Dr Nikki Dom, DO. The patient is personally interviewed and examined. He is having no further chest pain. Hemodynamically stable with stable heart rhythm. I agree with findings outlined above. He is doing well in the early recovery period from his acute MI. Cath films reviewed. Tx tele. Anticipate home tomorrow if no complications.   Sherren Mocha, M.D. 11/26/2022 2:52 PM

## 2022-11-26 NOTE — TOC Benefit Eligibility Note (Signed)
Patient Advocate Encounter  Insurance verification completed.    The patient is currently admitted and upon discharge could be taking Brilinta 90 mg.  The current 30 day co-pay is $0.00.   The patient is insured through [employer] Medicare Part D   Emile Ringgenberg, CPHT Pharmacy Patient Advocate Specialist [facility] Patient Advocate Team Direct Number: [phone]  Fax: [phone]       

## 2022-11-26 NOTE — Progress Notes (Signed)
CARDIAC REHAB PHASE I   Pt resting in bed feeling well today. Assisted oob, ambulated short distance and then to chair. Call bell and bedside table in reach. Tolerated activity well with no CP or SOB. Post MI/stent education including site care, restrictions, risk factors, antiplatelet therapy importance, Mi booklet, heart healthy diet, smoking cessation, exercise guidelines and CRP2 reviewed. All questions and concerns addressed. Will refer to AP for CRP2. Will continue to follow.   [id number] Vanessa Barbara, RN BSN 11/26/2022 2:31 PM

## 2022-11-26 NOTE — ED Notes (Signed)
Chest xray not done. Pt loading up with [facility] at the time

## 2022-11-26 NOTE — ED Provider Notes (Signed)
[facility] Provider Note   CSN: [id number] Arrival date & time: 11/26/22  0026     History  Chief Complaint  Patient presents with   Chest Pain    Tyler James is a 62 y.o. male.  Patient is a 62 year old male with past medical history of coronary artery disease with stent.  Patient presenting today with complaints of chest pain.  This has been occurring intermittently since 1030 this evening, then became constant at approximately midnight.  He describes a pressure to the center of his chest with associated nausea and shortness of breath.  This feels similar to what he experienced with his prior heart symptoms.  He denies any alleviating factors.  The history is provided by the patient.       Home Medications Prior to Admission medications   Medication Sig Start Date End Date Taking? Authorizing Provider  aspirin 81 MG chewable tablet Chew 1 tablet (81 mg total) by mouth daily. 03/13/16   Strader, Fransisco Hertz, PA-C  atorvastatin (LIPITOR) 40 MG tablet TAKE 1 TABLET BY MOUTH EVERY DAY 06/08/17   Arnoldo Lenis, MD  cyclobenzaprine (FLEXERIL) 10 MG tablet Take 10 mg by mouth daily at 12 noon.    [provider]  losartan (COZAAR) 25 MG tablet Take 25 mg by mouth daily.    [provider]  metoprolol succinate (TOPROL XL) 25 MG 24 hr tablet Take 1 tablet (25 mg total) by mouth daily. 10/24/22   Elgie Collard, PA-C  Multiple Vitamin (MULTIVITAMIN) tablet Take 1 tablet by mouth daily.    [provider]  nitroGLYCERIN (NITROSTAT) 0.4 MG SL tablet Place 1 tablet (0.4 mg total) under the tongue every 5 (five) minutes as needed for chest pain. 03/13/16   Ahmed Prima, Fransisco Hertz, PA-C      Allergies    Ibuprofen    Review of Systems   Review of Systems  All other systems reviewed and are negative.   Physical Exam Updated Vital Signs BP 108/68   Pulse 68   Temp 97.6 F (36.4 C)   Resp 19   Ht 5\' 9"  (1.753 m)   Wt 74.8 kg   SpO2  100%   BMI 24.37 kg/m  Physical Exam Vitals and nursing note reviewed.  Constitutional:      General: He is not in acute distress.    Appearance: He is well-developed. He is not diaphoretic.  HENT:     Head: Normocephalic and atraumatic.  Cardiovascular:     Rate and Rhythm: Normal rate and regular rhythm.     Heart sounds: No murmur heard.    No friction rub.  Pulmonary:     Effort: Pulmonary effort is normal. No respiratory distress.     Breath sounds: Normal breath sounds. No wheezing or rales.  Abdominal:     General: Bowel sounds are normal. There is no distension.     Palpations: Abdomen is soft.     Tenderness: There is no abdominal tenderness.  Musculoskeletal:        General: Normal range of motion.     Cervical back: Normal range of motion and neck supple.     Right lower leg: No tenderness. No edema.     Left lower leg: No tenderness. No edema.  Skin:    General: Skin is warm and dry.  Neurological:     Mental Status: He is alert and oriented to person, place, and time.     Coordination: Coordination  normal.     ED Results / Procedures / Treatments   Labs (all labs ordered are listed, but only abnormal results are displayed) Labs Reviewed  CBC WITH DIFFERENTIAL/PLATELET - Abnormal; Notable for the following components:      Result Value   MCV 77.2 (*)    MCH 24.4 (*)    Lymphs Abs 4.8 (*)    All other components within normal limits  PROTIME-INR  APTT  HEMOGLOBIN A1C  COMPREHENSIVE METABOLIC PANEL  LIPID PANEL  TROPONIN I (HIGH SENSITIVITY)    EKG EKG Interpretation  Date/Time:  Wednesday November 26 2022 00:40:19 EST Ventricular Rate:  50 PR Interval:  154 QRS Duration: 98 QT Interval:  447 QTC Calculation: 408 R Axis:   90 Text Interpretation: Sinus rhythm Inferior infarct, acute (RCA) Probable RV involvement, suggest recording right precordial leads >>> Acute MI <<< Confirmed by Veryl Speak [id number]) on 11/26/2022 12:46:03 AM  Radiology NM  Myocar Multi W/Spect W/Wall Motion / EF  Result Date: 11/25/2022   Down sloping ST depression 2 mm in the inferior leads (II, III and aVF) was noted during pharmacological stress (present at rest). ECG is positive for ischemia.   LV perfusion is abnormal. There is a small defect with mild reduction in uptake present in the mid to basal inferior location(s) that is partially reversible. There is abnormal wall motion in the defect area. Findings are consistent with prior myocardial infarction with peri-infarct ischemia. The study is low risk.   Left ventricular function is normal, 54%    Procedures Procedures    Medications Ordered in ED Medications  0.9 %  sodium chloride infusion ( Intravenous Transfusing/Transfer 11/26/22 0105)  aspirin chewable tablet 324 mg (324 mg Oral Given 11/26/22 0052)  heparin injection 4,000 Units (4,000 Units Intravenous Given 11/26/22 0054)    ED Course/ Medical Decision Making/ A&P  Patient presenting with complaints of chest pain as described in the HPI.  He has prior cardiac history with prior stenting.  EKG obtained shows an acute inferior MI.    A code STEMI was initiated and care discussed immediately with Dr. Ellyn Hack from cardiology.  Patient will be given heparin and aspirin and emergently transferred to [facility] for likely cardiac catheterization.  CRITICAL CARE Performed by: Veryl Speak Total critical care time: 35 minutes Critical care time was exclusive of separately billable procedures and treating other patients. Critical care was necessary to treat or prevent imminent or life-threatening deterioration. Critical care was time spent personally by me on the following activities: development of treatment plan with patient and/or surrogate as well as nursing, discussions with consultants, evaluation of patient's response to treatment, examination of patient, obtaining history from patient or surrogate, ordering and performing treatments and  interventions, ordering and review of laboratory studies, ordering and review of radiographic studies, pulse oximetry and re-evaluation of patient's condition.   Final Clinical Impression(s) / ED Diagnoses Final diagnoses:  Acute ST elevation myocardial infarction (STEMI), unspecified artery [facility])    Rx / DC Orders ED Discharge Orders     None         Veryl Speak, MD 11/26/22 0111

## 2022-11-26 NOTE — ED Provider Notes (Signed)
  Physical Exam  BP 108/68   Pulse 68   Temp 97.6 F (36.4 C)   Resp 19   Ht 5\' 9"  (1.753 m)   Wt 74.8 kg   SpO2 100%   BMI 24.37 kg/m   Physical Exam Constitutional:      Appearance: He is normal weight.     Comments: Nontoxic, NAD  Cardiovascular:     Rate and Rhythm: Normal rate.  Pulmonary:     Effort: Pulmonary effort is normal.  Neurological:     Mental Status: He is alert and oriented to person, place, and time.     Procedures  .Critical Care  Performed by: Elgie Congo, MD Authorized by: Elgie Congo, MD   Critical care provider statement:    Critical care time (minutes):  30   Critical care was necessary to treat or prevent imminent or life-threatening deterioration of the following conditions:  Cardiac failure   Critical care was time spent personally by me on the following activities:  Development of treatment plan with patient or surrogate, discussions with consultants, evaluation of patient's response to treatment, examination of patient, ordering and review of laboratory studies, ordering and review of radiographic studies, ordering and performing treatments and interventions, pulse oximetry, re-evaluation of patient's condition, review of old charts and obtaining history from patient or surrogate   Care discussed with: admitting provider     ED Course / MDM    Medical Decision Making 62 year old male with history of CAD and previous stent who presented with chest pain.  He was sent from [facility] with concerns for inferior MI.  I reviewed patient's EKG there were inferior lead elevations with reciprocal changes in the anterior and lateral leads with ST depressions.  Concern for inferior STEMI.  He was given 4000 units IV heparin and full dose aspirin at [facility].  On arrival to ED, he is hemodynamically stable and well in appearance.  He had no evidence of increased work of breathing and airways was intact.  He did not look severely  uncomfortable and was moving all extremities equally, unlikely dissection or CHF. He was taken to Cath Lab by cardiology for further intervention.  Amount and/or Complexity of Data Reviewed Labs: ordered.  Risk OTC drugs. Prescription drug management. Decision regarding hospitalization.          Elgie Congo, MD 11/26/22 [phone]

## 2022-11-26 NOTE — Progress Notes (Signed)
  Echocardiogram 2D Echocardiogram has been performed.  Wynelle Link 11/26/2022, 11:00 AM

## 2022-11-26 NOTE — H&P (Addendum)
Cardiology Admission History and Physical   Patient ID: STEEN BISIG MRN: [id number]; DOB: 1961-05-06   Admission date: 11/26/2022  PCP:  Benetta Spar, MD   [facility] Providers Cardiologist:  Dina Rich, MD        Chief Complaint: Inferolateral STEMI  Patient Profile:   Tyler James is a 62 y.o. 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  History of Present Illness:   Tyler James developed stuttering substernal chest pressure this evening that became continuous around midnight. He presented to the [facility] and ECG showed inferior STEMI with reciprocal depressions. He received 325 mg ASA and 4,000 units of heparin and was transferred via EMS to our facility. En route his chest pain subsided substantially and at the time of rolling into the cath lab his pain was 3/10. ->  EKG by EMS actually showed resolution of ST elevations.  He was recently in the ED 12/31 for dyspnea on exertion and workup was notable for negative troponin. ECG showed ST depressions that self-resolved. He was discharged with plan for stress test. He recently had a nuclear stress test two days ago that had positive depressions in the inferior leads and the images were read as inferior infarct with peri-infarct ischemia.   Past Medical History:  Diagnosis Date   CAD (coronary artery disease)    a. 02/2016: STEMI - 100% stenosis OM2 w/ DES placed, POBA to LCx, and staged PCI of 95% stenosed RCA w/ DES.   Ischemic cardiomyopathy    a. 02/2016: EF 45-50% w/ diffuse inferolateral hypokinesis    Past Surgical History:  Procedure Laterality Date   CARDIAC CATHETERIZATION N/A 03/10/2016   Procedure: Left Heart Cath and Coronary Angiography;  Surgeon: Peter M Swaziland, MD;  Location: [facility] INVASIVE CV LAB;  Service:  Cardiovascular;  Laterality: N/A;   CARDIAC CATHETERIZATION N/A 03/10/2016   Procedure: Coronary Stent Intervention;  Surgeon: Peter M Swaziland, MD;  Location: [facility] INVASIVE CV LAB;  Service: Cardiovascular;  Laterality: N/A;   CARDIAC CATHETERIZATION N/A 03/12/2016   Procedure: Coronary Stent Intervention;  Surgeon: Kathleene Hazel, MD;  Location: [facility] INVASIVE CV LAB;  Service: Cardiovascular;  Laterality: N/A;   TOTAL HIP ARTHROPLASTY     bilateral     Medications Prior to Admission: Prior to Admission medications   Medication Sig Start Date End Date Taking? Authorizing Provider  aspirin 81 MG chewable tablet Chew 1 tablet (81 mg total) by mouth daily. 03/13/16   Strader, Lennart Pall, PA-C  atorvastatin (LIPITOR) 40 MG tablet TAKE 1 TABLET BY MOUTH EVERY DAY 06/08/17   Antoine Poche, MD  cyclobenzaprine (FLEXERIL) 10 MG tablet Take 10 mg by mouth daily at 12 noon.    [provider]  losartan (COZAAR) 25 MG tablet Take 25 mg by mouth daily.    [provider]  metoprolol succinate (TOPROL XL) 25 MG 24 hr tablet Take 1 tablet (25 mg total) by mouth daily. 10/24/22   Sharlene Dory, PA-C  Multiple Vitamin (MULTIVITAMIN) tablet Take 1 tablet by mouth daily.    [provider]  nitroGLYCERIN (NITROSTAT) 0.4 MG SL tablet Place 1 tablet (0.4 mg total) under the tongue every 5 (five) minutes as needed for chest pain. 03/13/16   Ellsworth Lennox, PA-C  Allergies:    Allergies  Allergen Reactions   Ibuprofen Swelling    Swelling of face    Social History:   Social History   Socioeconomic History   Marital status: Single    Spouse name: Not on file   Number of children: Not on file   Years of education: Not on file   Highest education level: Not on file  Occupational History   Occupation: currently unemployed.   Tobacco Use   Smoking status: Every Day    Packs/day: 1.00    Types: Cigarettes   Smokeless tobacco: Never  Vaping Use   Vaping Use:  Never used  Substance and Sexual Activity   Alcohol use: Yes    Comment: weekly   Drug use: No   Sexual activity: Yes  Other Topics Concern   Not on file  Social History Narrative   Not on file   Social Determinants of Health   Financial Resource Strain: Not on file  Food Insecurity: Not on file  Transportation Needs: Not on file  Physical Activity: Not on file  Stress: Not on file  Social Connections: Not on file  Intimate Partner Violence: Not on file    Family History:   The patient's family history is not on file.    ROS:  Please see the history of present illness.  All other ROS reviewed and negative.     Physical Exam/Data:   Vitals:   11/26/22 0045 11/26/22 0050 11/26/22 0055 11/26/22 0139  BP:      Pulse: (!) 57 60 68   Resp: (!) 21 (!) 24 19   Temp:      SpO2: 100% 100% 100% 100%  Weight:      Height:       No intake or output data in the 24 hours ending 11/26/22 0159    11/26/2022   12:35 AM 11/16/2022    8:38 AM 10/24/2022    2:05 PM  Last 3 Weights  Weight (lbs) 165 lb 165 lb 160 lb 3.2 oz  Weight (kg) 74.844 kg 74.844 kg 72.666 kg     Body mass index is 24.37 kg/m.  General:  Well nourished, well developed, in no acute distress HEENT: normal Neck: no JVD Vascular: No carotid bruits; Distal pulses 2+ bilaterally   Cardiac:  normal S1, S2; RRR; no murmur  Lungs:  clear to auscultation bilaterally, no wheezing, rhonchi or rales  Abd: soft, nontender, no hepatomegaly  Ext: no edema Musculoskeletal:  No deformities, BUE and BLE strength normal and equal Skin: warm and dry  Neuro:  CNs 2-12 intact, no focal abnormalities noted Psych:  Normal affect    EKG:  The ECG that was done and was personally reviewed and demonstrates inferior STEMI with reciprocal septal depressions and lateral depressions  Relevant CV Studies: Echo 2017  - Left ventricle: The cavity size was normal. Wall thickness was    normal. Systolic function was mildly reduced.  The estimated    ejection fraction was in the range of 45% to 50%. There is    hypokinesis of the inferolateral myocardium. Left ventricular    diastolic function parameters were normal.  - Tricuspid valve: There was mild-moderate regurgitation.  - Pulmonary arteries: Systolic pressure was mildly increased. PA    peak pressure: 42 mm Hg (S).   Impressions:   - Hypokinesis of the inferior lateral wall with overall mildly    reduced LV function; redundant MV chordae; trace MR; mild to  moderate TR; mildly elevated pulmonary pressure.   Cath 2017  Ost 1st Mrg lesion, 90% stenosed. Prox LAD lesion, 30% stenosed. Mid RCA lesion, 95% stenosed. Ost 2nd Mrg to 2nd Mrg lesion, 100% stenosed. Post intervention, there is a 0% residual stenosis. => Promus Premier DES 4.0 mm x 24 mm Prox Cx lesion, 50% stenosed. Post intervention, there is a 10% residual stenosis.   1. 2 vessel obstructive CAD. The culprit lesion is occlusion of a large OM2 branch. There is severe mid RCA stenosis. The first OM is very small. 2. Successful stenting of the second OM with DES. POBA of the distal LCx through the stent struts.  Cath 2017  Prox RCA lesion, 99% stenosed. Post intervention, there is a 0% residual stenosis.   1. Severe stenosis mid RCA 2. Successful PTCA/DES x 1 mid RCA (Promus Premier DES 2.25 mm  x 12 mm)   Laboratory Data:  High Sensitivity Troponin:   Recent Labs  Lab 11/16/22 0951 11/16/22 1031 11/26/22 0043  TROPONINIHS 10 11 11       Chemistry Recent Labs  Lab 11/26/22 0043  NA 138  K 3.2*  CL 105  CO2 25  GLUCOSE 129*  BUN 21  CREATININE 0.98  CALCIUM 8.7*  GFRNONAA >60  ANIONGAP 8    Recent Labs  Lab 11/26/22 0043  PROT 6.7  ALBUMIN 3.8  AST 34  ALT 43  ALKPHOS 62  BILITOT 0.7   Lipids  Recent Labs  Lab 11/26/22 0043  CHOL 128  TRIG 117  HDL 50  LDLCALC 55  CHOLHDL 2.6   Hematology Recent Labs  Lab 11/26/22 0043  WBC 9.3  RBC 5.44  HGB 13.3   HCT 42.0  MCV 77.2*  MCH 24.4*  MCHC 31.7  RDW 14.3  PLT 216   Thyroid No results for input(s): "TSH", "FREET4" in the last 168 hours. BNPNo results for input(s): "BNP", "PROBNP" in the last 168 hours.  DDimer No results for input(s): "DDIMER" in the last 168 hours.   Radiology/Studies:  No results found.   Assessment and Plan:   STEMI s/p PCI of LCx -> did have 1 short run of sustained VF requiring defibrillation x 1 restoring sinus rhythm.  No further arrhythmias. - Echo - ASA/T[place]ag - Metop - Losartan - Lipid panel, Lpa => high dose statin  HTN - see above HLD - see above Tobacco use disorder Hypokalemia - replenish   Risk Assessment/Risk Scores:    TIMI Risk Score for ST  Elevation MI:   The patient's TIMI risk score is 1, which indicates a 1.6% risk of all cause mortality at 30 days.        Severity of Illness: The appropriate patient status for this patient is INPATIENT. Inpatient status is judged to be reasonable and necessary in order to provide the required intensity of service to ensure the patient's safety. The patient's presenting symptoms, physical exam findings, and initial radiographic and laboratory data in the context of their chronic comorbidities is felt to place them at high risk for further clinical deterioration. Furthermore, it is not anticipated that the patient will be medically stable for discharge from the hospital within 2 midnights of admission.   * I certify that at the point of admission it is my clinical judgment that the patient will require inpatient hospital care spanning beyond 2 midnights from the point of admission due to high intensity of service, high risk for further deterioration and high frequency of surveillance required.*  For questions or updates, please contact [place] Please consult [email or web address] for contact info under     Signed, Loren Racer, MD  11/26/2022 1:59 AM    ATTENDING ATTESTATION  I  have seen, examined and evaluated the patient this AM along with Dr. Gwynneth Aliment.  After reviewing all the available data and chart, we discussed the patients laboratory, study & physical findings as well as symptoms in detail.  I agree with his findings, examination as well as impression recommendations as per our discussion.    Attending adjustments noted in italics.   Patient presented [facility] with chest pain and EKG concerning for Inferolateral STEMI with significant chest pain.  Code STEMI called he was given IV heparin and aspirin and brought to the [facility] Lab via EMS for catheterization.  Upon arrival of EMS, their EKG actually did not show significant ST elevations and his pain had notably improved after the heparin.  Upon arrival to the notes, Lab his pain was down to 3 out of 10.  We proceeded with cardiac catheterization with found severe thrombotic stenosis of the proximal stent edge in the LCx prior to the AV groove, there is also an ostial 75% stenosis of the LCx as well.  The series were covered with overlapping DES stent (Synergy 4.0 x 20 mm postdilated to 4.5 mm. ->  The mall jailed OM1 that had an ostial 80% stenosis previously continues to have an 80% stenosis with TIMI II flow distally.  The jailed AV groove LCx had a previous 55% stenosis now 75%, but stable with TIMI-3 flow. => Prior to angiography, on crossing the aortic valve into the LV with a diagnostic catheter Patient developed VF that was shocked within 30 seconds of onset restoring sinus rhythm.  Patient is coherent with no significant issues after shock.  EF relatively preserved roughly 45 to 50% with inferolateral hypokinesis.  Would recommend 2D echo to get better assessment.  LVEDP was 20 mm 3.  He will be admitted for standard post PCI care.  Unfortunately, he was scheduled to have hip surgery in the end of the month which will clearly have to be delayed.  Would like to have at least 3 months of  uninterrupted DAPT, and if surgery would be urgent, but then need to consider bridging with Aggrastat.    Leonie Man, MD, MS Glenetta Hew, M.D., M.S. Interventional Cardiologist  [place]  Pager # [phone] Phone # [phone] [address] [place], [place] [address]

## 2022-11-26 NOTE — ED Notes (Signed)
Pt being transported with [facility]

## 2022-11-26 NOTE — ED Triage Notes (Signed)
Pt presents with chest pain (central chest) and SOB that started around 1030 tonight. Pt also complaining of sweats. Previous MI 2 years ago.

## 2022-11-27 ENCOUNTER — Other Ambulatory Visit: Payer: Self-pay

## 2022-11-27 ENCOUNTER — Other Ambulatory Visit (HOSPITAL_COMMUNITY): Payer: Self-pay

## 2022-11-27 DIAGNOSIS — I2121 ST elevation (STEMI) myocardial infarction involving left circumflex coronary artery: Secondary | ICD-10-CM | POA: Diagnosis not present

## 2022-11-27 DIAGNOSIS — I10 Essential (primary) hypertension: Secondary | ICD-10-CM | POA: Insufficient documentation

## 2022-11-27 DIAGNOSIS — E785 Hyperlipidemia, unspecified: Secondary | ICD-10-CM | POA: Insufficient documentation

## 2022-11-27 LAB — HIV ANTIBODY (ROUTINE TESTING W REFLEX): HIV Screen 4th Generation wRfx: NONREACTIVE

## 2022-11-27 LAB — LIPOPROTEIN A (LPA): Lipoprotein (a): 39.1 nmol/L — ABNORMAL HIGH (ref <75.0)

## 2022-11-27 MED ORDER — ATORVASTATIN CALCIUM 80 MG PO TABS
80.0000 mg | ORAL_TABLET | Freq: Every day | ORAL | 1 refills | Status: AC
  Filled 2022-11-27: qty 90, 90d supply, fill #0

## 2022-11-27 MED ORDER — TICAGRELOR 90 MG PO TABS
90.0000 mg | ORAL_TABLET | Freq: Two times a day (BID) | ORAL | 2 refills | Status: DC
  Filled 2022-11-27: qty 180, 90d supply, fill #0

## 2022-11-27 NOTE — Discharge Summary (Addendum)
Discharge Summary    Patient ID: Tyler James MRN: [id number]; DOB: February 03, 1961  Admit date: 11/26/2022 Discharge date: 11/27/2022  PCP:  Carrolyn Meiers, [place] Providers Cardiologist:  Carlyle Dolly, MD     Discharge Diagnoses    Principal Problem:   STEMI (ST elevation myocardial infarction) [facility]) Active Problems:   ST elevation (STEMI) myocardial infarction involving left circumflex coronary artery ([place])   Tobacco abuse   Acute ST elevation myocardial infarction (STEMI) of inferolateral wall [facility])   Coronary artery disease involving native coronary artery of native heart with unstable angina pectoris ([place])   Hypertension   Hyperlipidemia   Diagnostic Studies/Procedures    Cath: 11/26/2022    CULPRIT LESION IN SEGMENT: Ost Cx lesion is 75% stenosed.  Ost Cx to Prox Cx lesion is 95% stenosed involving the distal edge of the previously placed stent..  The distal two thirds of the stent was widely patent.   A drug-eluting stent was successfully placed using a SYNERGY XD 4.0X20. ->  Postdilated to 4.5 mm.  Post intervention, there is a 0% residual stenosis.   1st Mrg lesion is 85% stenosed.   LPAV lesion is 75% stenosed.   Mid LM to Prox LAD lesion is 5% stenosed.  Mid LAD lesion is 50% stenosed.   Previously placed Prox RCA stent of unknown type is  widely patent.   ----------------------------------------------------   There is mild left ventricular systolic dysfunction.   LV end diastolic pressure is moderately elevated.   The left ventricular ejection fraction is 45-50% by visual estimate.   There is no aortic valve stenosis.   POST-CATH DIAGNOSES: Culprit Lesion: Proximal LCx 95% stenosis (irregular/ulcerated involving proximal stent edge) associated with ostial 75% stenosis Successful DES PCI covering both lesions overlapping the previous stent with Synergy DES 4.0 x 20 mm postdilated to 4.5 mm. Known 85% stenosis of OM1, and previous 55%  stenosis of jailed ostial AV groove LCx treated with PTCA at time of original stent Widely patent RCA stent Mild diffuse disease in the LAD with calcification in the proximal segment with moderate stenosis just after heavily diseased main diagonal branch.   Mildly reduced LV function with likely inferolateral hypokinesis.      RECOMMENDATIONS   He will be admitted for standard post PCI care.  => Cycle troponins, check FLP, CMP and A1c Beta-blocker, ARB, high-dose statin. Would recommend 2D echo to get better assessment.  LVEDP was 20 mmHg Smoking cessation counseling     Could consider FastTRACK discharge if stable.   Glenetta Hew, MD  Diagnostic Dominance: Co-dominant  Intervention   Echo: 11/26/2022  MPRESSIONS     1. Left ventricular ejection fraction, by estimation, is 55%. The left  ventricle has normal function. The left ventricle demonstrates regional  wall motion abnormalities (see scoring diagram/findings for description).  Left ventricular diastolic  parameters were normal.   2. Right ventricular systolic function is normal. The right ventricular  size is normal. There is normal pulmonary artery systolic pressure. The  estimated right ventricular systolic pressure is 03.5 mmHg.   3. The mitral valve is grossly normal. Mild mitral valve regurgitation.  No evidence of mitral stenosis.   4. The aortic valve is tricuspid. There is mild calcification of the  aortic valve. There is mild thickening of the aortic valve. Aortic valve  regurgitation is not visualized. No aortic stenosis is present.   5. The inferior vena cava is dilated in size with >50% respiratory  variability, suggesting right atrial pressure of 8 mmHg.   FINDINGS   Left Ventricle: Left ventricular ejection fraction, by estimation, is  55%. The left ventricle has normal function. The left ventricle  demonstrates regional wall motion abnormalities. Global longitudinal  strain performed but not  reported based on  interpreter judgement due to suboptimal tracking. 3D left ventricular  ejection fraction analysis performed but not reported based on interpreter  judgement due to suboptimal tracking. The left ventricular internal cavity  size was normal in size. There is  borderline left ventricular hypertrophy. Left ventricular diastolic  parameters were normal.     LV Wall Scoring:  The inferior wall and mid inferolateral segment are hypokinetic.   Right Ventricle: The right ventricular size is normal. No increase in  right ventricular wall thickness. Right ventricular systolic function is  normal. There is normal pulmonary artery systolic pressure. The tricuspid  regurgitant velocity is 2.38 m/s, and   with an assumed right atrial pressure of 8 mmHg, the estimated right  ventricular systolic pressure is [id number] mmHg.   Left Atrium: Left atrial size was normal in size.   Right Atrium: Right atrial size was normal in size.   Pericardium: There is no evidence of pericardial effusion.   Mitral Valve: The mitral valve is grossly normal. Mild mitral valve  regurgitation. No evidence of mitral valve stenosis.   Tricuspid Valve: The tricuspid valve is normal in structure. Tricuspid  valve regurgitation is trivial. No evidence of tricuspid stenosis.   Aortic Valve: The aortic valve is tricuspid. There is mild calcification  of the aortic valve. There is mild thickening of the aortic valve. Aortic  valve regurgitation is not visualized. No aortic stenosis is present.   Pulmonic Valve: The pulmonic valve was normal in structure. Pulmonic valve  regurgitation is trivial. No evidence of pulmonic stenosis.   Aorta: The aortic root is normal in size and structure.   Venous: The inferior vena cava is dilated in size with greater than 50%  respiratory variability, suggesting right atrial pressure of 8 mmHg.   IAS/Shunts: No atrial level shunt detected by color flow Doppler.   _____________   History of Present Illness     Tyler James is a 62 y.o. 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   Mr. Boling developed stuttering substernal chest pressure this evening that became continuous around midnight that evening. He presented to the [facility] ED and ECG showed inferior STEMI with reciprocal depressions. He received 325 mg ASA and 4,000 units of heparin and was transferred via EMS to our facility. En route his chest pain subsided substantially and at the time of rolling into the cath lab his pain was 3/10. ->  EKG by EMS actually showed resolution of ST elevations.   He was recently in the ED 12/31 for dyspnea on exertion and workup was notable for negative troponin. ECG showed ST depressions that self-resolved. He was discharged with plan for stress test. He recently had a nuclear stress test two days ago that had positive depressions in the inferior leads and the images were read as inferior infarct with peri-infarct ischemia.   Taken to the cath lab emergently on arrival.   Hospital Course     STEMI -- Cardiac catheterization noted above with ostial left circumflex  of 75%, proximal left circumflex 95% stenosis treated with PCI/DES x 1, overlapping previously placed stent.  Known 85% stenosis of OM1 and 55% stenosis of jailed ostial AV groove circumflex treated with PTCA.  Recommendations for DAPT with aspirin/Brilinta for at least 1 year.  High-sensitivity troponin peaked at 4528. Likely continue Brilinta beyond a year at reduced dose.  Recurrent chest pain.  Seen by cardiac rehab. -- Continue aspirin, Brilinta, metoprolol, statin  HTN -- stable, soft at times -- Continue Toprol XL 25 mg daily, losartan 25 mg daily  HLD -- LDL 53, HDL 53, LP(a) 39 -- atorvastatin increased to 80 mg  daily -- Will need LFT/FLP in 8 weeks  Tobacco use -- Cessation advised  General: Well developed, well nourished, male appearing in no acute distress. Head: Normocephalic, atraumatic.  Neck: Supple without bruits, JVD. Lungs:  Resp regular and unlabored, CTA. Heart: RRR, S1, S2, no S3, S4, or murmur; no rub. Abdomen: Soft, non-tender, non-distended with normoactive bowel sounds. No hepatomegaly. No rebound/guarding. No obvious abdominal masses. Extremities: No clubbing, cyanosis, edema. Distal pedal pulses are 2+ bilaterally. Right radial cath site stable without bruising or hematoma Neuro: Alert and oriented X 3. Moves all extremities spontaneously. Psych: Normal affect.  Patient seen by Dr. Burt Knack and deemed stable for discharge home. Follow up arranged in the office. Medications sent to the [facility] pharmacy. Educated by pharmD prior to DC.  Did the patient have an acute coronary syndrome (MI, NSTEMI, STEMI, etc) this admission?:  Yes                               AHA/ACC Clinical Performance & Quality Measures: Aspirin prescribed? - Yes ADP Receptor Inhibitor (Plavix/Clopidogrel, Brilinta/Ticagrelor or Effient/Prasugrel) prescribed (includes medically managed patients)? - Yes Beta Blocker prescribed? - Yes High Intensity Statin (Lipitor 40-80mg  or Crestor 20-40mg ) prescribed? - Yes EF assessed during THIS hospitalization? - Yes For EF <40%, was ACEI/ARB prescribed? - Not Applicable (EF >/= [id number]) For EF <40%, Aldosterone Antagonist (Spironolactone or Eplerenone) prescribed? - Not Applicable (EF >/= [id number]) Cardiac Rehab Phase II ordered (including medically managed patients)? - Yes       The patient will be scheduled for a TOC follow up appointment in 10-14 days.  A message has been sent to the [facility] and Scheduling Pool at the office where the patient should be seen for follow up.  _____________  Discharge Vitals Blood pressure 119/66, pulse 60, temperature (!) 97.5 F (36.4 C),  temperature source Oral, resp. rate 12, height 5\' 9"  (1.753 m), weight 74.8 kg, SpO2 100 %.  Filed Weights   11/26/22 0035  Weight: 74.8 kg    Labs & Radiologic Studies    CBC Recent Labs    11/26/22 0043 11/26/22 0336  WBC 9.3 7.4  NEUTROABS 3.1  --   HGB 13.3 12.9*  HCT 42.0 40.5  MCV 77.2* 76.4*  PLT 216 [id number]   Basic Metabolic Panel Recent Labs    11/26/22 0043 11/26/22 0332  NA 138 136  K 3.2* 4.1  CL 105 105  CO2 25 22  GLUCOSE 129* 111*  BUN 21 17  CREATININE 0.98 0.93  CALCIUM 8.7* 8.7*   Liver Function Tests Recent Labs    11/26/22 0043  AST 34  ALT 43  ALKPHOS 62  BILITOT 0.7  PROT 6.7  ALBUMIN 3.8   No results for input(s): "LIPASE", "AMYLASE" in the last 72 hours. High Sensitivity  Troponin:   Recent Labs  Lab 11/16/22 0951 11/16/22 1031 11/26/22 0043 11/26/22 0533 11/26/22 0723  TROPONINIHS 10 11 11  2,975* 4,528*    BNP Invalid input(s): "POCBNP" D-Dimer No results for input(s): "DDIMER" in the last 72 hours. Hemoglobin A1C Recent Labs    11/26/22 0043  HGBA1C 5.9*   Fasting Lipid Panel Recent Labs    11/26/22 0336  CHOL 117  HDL 53  LDLCALC 53  TRIG 54  CHOLHDL 2.2   Thyroid Function Tests No results for input(s): "TSH", "T4TOTAL", "T3FREE", "THYROIDAB" in the last 72 hours.  Invalid input(s): "FREET3" _____________  ECHOCARDIOGRAM COMPLETE  Result Date: 11/26/2022    ECHOCARDIOGRAM REPORT   Patient Name:   Tyler James Date of Exam: 11/26/2022 Medical Rec #:  [id number]    Height:       69.0 in Accession #:    [id number]   Weight:       165.0 lb Date of Birth:  05-25-1961     BSA:          1.904 m Patient Age:    61 years     BP:           121/73 mmHg Patient Gender: M            HR:           56 bpm. Exam Location:  Inpatient Procedure: 2D Echo, 3D Echo, Cardiac Doppler, Color Doppler and Strain Analysis Indications:    Acute myocardial infarction, unspecified I21.9  History:        Patient has no prior history of  Echocardiogram examinations.                 STEMI and CAD; Risk Factors:Current Smoker.  Sonographer:    Aron Baba Referring Phys: 61 DAVID W HARDING  Sonographer Comments: Image acquisition challenging due to respiratory motion. Global longitudinal strain was attempted. IMPRESSIONS  1. Left ventricular ejection fraction, by estimation, is 55%. The left ventricle has normal function. The left ventricle demonstrates regional wall motion abnormalities (see scoring diagram/findings for description). Left ventricular diastolic parameters were normal.  2. Right ventricular systolic function is normal. The right ventricular size is normal. There is normal pulmonary artery systolic pressure. The estimated right ventricular systolic pressure is 30.7 mmHg.  3. The mitral valve is grossly normal. Mild mitral valve regurgitation. No evidence of mitral stenosis.  4. The aortic valve is tricuspid. There is mild calcification of the aortic valve. There is mild thickening of the aortic valve. Aortic valve regurgitation is not visualized. No aortic stenosis is present.  5. The inferior vena cava is dilated in size with >50% respiratory variability, suggesting right atrial pressure of 8 mmHg. FINDINGS  Left Ventricle: Left ventricular ejection fraction, by estimation, is 55%. The left ventricle has normal function. The left ventricle demonstrates regional wall motion abnormalities. Global longitudinal strain performed but not reported based on interpreter judgement due to suboptimal tracking. 3D left ventricular ejection fraction analysis performed but not reported based on interpreter judgement due to suboptimal tracking. The left ventricular internal cavity size was normal in size. There is borderline left ventricular hypertrophy. Left ventricular diastolic parameters were normal.  LV Wall Scoring: The inferior wall and mid inferolateral segment are hypokinetic. Right Ventricle: The right ventricular size is normal. No  increase in right ventricular wall thickness. Right ventricular systolic function is normal. There is normal pulmonary artery systolic pressure. The tricuspid regurgitant velocity is 2.38 m/s, and  with an  assumed right atrial pressure of 8 mmHg, the estimated right ventricular systolic pressure is 63.0 mmHg. Left Atrium: Left atrial size was normal in size. Right Atrium: Right atrial size was normal in size. Pericardium: There is no evidence of pericardial effusion. Mitral Valve: The mitral valve is grossly normal. Mild mitral valve regurgitation. No evidence of mitral valve stenosis. Tricuspid Valve: The tricuspid valve is normal in structure. Tricuspid valve regurgitation is trivial. No evidence of tricuspid stenosis. Aortic Valve: The aortic valve is tricuspid. There is mild calcification of the aortic valve. There is mild thickening of the aortic valve. Aortic valve regurgitation is not visualized. No aortic stenosis is present. Pulmonic Valve: The pulmonic valve was normal in structure. Pulmonic valve regurgitation is trivial. No evidence of pulmonic stenosis. Aorta: The aortic root is normal in size and structure. Venous: The inferior vena cava is dilated in size with greater than 50% respiratory variability, suggesting right atrial pressure of 8 mmHg. IAS/Shunts: No atrial level shunt detected by color flow Doppler.  LEFT VENTRICLE PLAX 2D LVIDd:         4.50 cm   Diastology LVIDs:         2.90 cm   LV e' medial:    7.62 cm/s LV PW:         1.10 cm   LV E/e' medial:  13.4 LV IVS:        0.80 cm   LV e' lateral:   11.70 cm/s LVOT diam:     2.00 cm   LV E/e' lateral: 8.7 LV SV:         63 LV SV Index:   33 LVOT Area:     3.14 cm                           3D Volume EF:                          LV EDV:       138 ml                          LV ESV:       73 ml                          LV SV:        65 ml RIGHT VENTRICLE RV S prime:     11.80 cm/s TAPSE (M-mode): 2.5 cm LEFT ATRIUM             Index        RIGHT  ATRIUM           Index LA diam:        3.30 cm 1.73 cm/m   RA Area:     14.40 cm LA Vol (A2C):   51.1 ml 26.84 ml/m  RA Volume:   32.00 ml  16.81 ml/m LA Vol (A4C):   46.0 ml 24.16 ml/m LA Biplane Vol: 50.2 ml 26.37 ml/m  AORTIC VALVE LVOT Vmax:   97.50 cm/s LVOT Vmean:  61.300 cm/s LVOT VTI:    0.202 m  AORTA Ao Root diam: 3.40 cm Ao Asc diam:  3.20 cm MITRAL VALVE                TRICUSPID VALVE MV Area (PHT): 5.84 cm     TR Peak  grad:   22.7 mmHg MV Decel Time: 130 msec     TR Vmax:        238.00 cm/s MR Peak grad: 3.1 mmHg MR Vmax:      88.50 cm/s    SHUNTS MV E velocity: 102.00 cm/s  Systemic VTI:  0.20 m MV A velocity: 86.50 cm/s   Systemic Diam: 2.00 cm MV E/A ratio:  1.18 Cherlynn Kaiser MD Electronically signed by Cherlynn Kaiser MD Signature Date/Time: 11/26/2022/3:27:00 PM    Final    CARDIAC CATHETERIZATION  Result Date: 11/26/2022   CULPRIT LESION IN SEGMENT: Ost Cx lesion is 75% stenosed.  Ost Cx to Prox Cx lesion is 95% stenosed involving the distal edge of the previously placed stent..  The distal two thirds of the stent was widely patent.   A drug-eluting stent was successfully placed using a SYNERGY XD 4.0X20. ->  Postdilated to 4.5 mm.  Post intervention, there is a 0% residual stenosis.   1st Mrg lesion is 85% stenosed.   LPAV lesion is 75% stenosed.   Mid LM to Prox LAD lesion is 5% stenosed.  Mid LAD lesion is 50% stenosed.   Previously placed Prox RCA stent of unknown type is  widely patent.   ----------------------------------------------------   There is mild left ventricular systolic dysfunction.   LV end diastolic pressure is moderately elevated.   The left ventricular ejection fraction is 45-50% by visual estimate.   There is no aortic valve stenosis. POST-CATH DIAGNOSES: Culprit Lesion: Proximal LCx 95% stenosis (irregular/ulcerated involving proximal stent edge) associated with ostial 75% stenosis Successful DES PCI covering both lesions overlapping the previous stent with  Synergy DES 4.0 x 20 mm postdilated to 4.5 mm. Known 85% stenosis of OM1, and previous 55% stenosis of jailed ostial AV groove LCx treated with PTCA at time of original stent Widely patent RCA stent Mild diffuse disease in the LAD with calcification in the proximal segment with moderate stenosis just after heavily diseased main diagonal branch. Mildly reduced LV function with likely inferolateral hypokinesis. RECOMMENDATIONS He will be admitted for standard post PCI care.  => Cycle troponins, check FLP, CMP and A1c Beta-blocker, ARB, high-dose statin. Would recommend 2D echo to get better assessment.  LVEDP was 20 mmHg Smoking cessation counseling Could consider FastTRACK discharge if stable. Glenetta Hew, MD  NM Myocar Multi W/Spect Tamela Oddi Motion / EF  Result Date: 11/25/2022   Down sloping ST depression 2 mm in the inferior leads (II, III and aVF) was noted during pharmacological stress (present at rest). ECG is positive for ischemia.   LV perfusion is abnormal. There is a small defect with mild reduction in uptake present in the mid to basal inferior location(s) that is partially reversible. There is abnormal wall motion in the defect area. Findings are consistent with prior myocardial infarction with peri-infarct ischemia. The study is low risk.   Left ventricular function is normal, 54%   DG Chest Port 1 View  Result Date: 11/16/2022 CLINICAL DATA:  Shortness of breath EXAM: PORTABLE CHEST 1 VIEW COMPARISON:  None Available. FINDINGS: The heart size and mediastinal contours are within normal limits. No focal airspace consolidation, pleural effusion, or pneumothorax. The visualized skeletal structures are unremarkable. IMPRESSION: No active disease. Electronically Signed   By: Davina Poke D.O.   On: 11/16/2022 09:15   Disposition   Pt is being discharged home today in good condition.  Follow-up Plans & Appointments     Follow-up [place],  Benjamine Mola, NP Follow up on 12/04/2022.    Specialty: Cardiology Why: at 2:30pm for your follow up appt with Dr. Percell Locus' NP [place] information: [address] [place] [address] [phone]                Discharge Instructions     Amb Referral to Cardiac Rehabilitation   Complete by: As directed    Diagnosis:  Coronary Stents STEMI     After initial evaluation and assessments completed: Virtual Based Care may be provided alone or in conjunction with Phase 2 Cardiac Rehab based on patient barriers.: Yes   Intensive Cardiac Rehabilitation (ICR) [place] location only OR Traditional Cardiac Rehabilitation (TCR) *If criteria for ICR are not met will enroll in TCR [facility] only): Yes   Call MD for:  difficulty breathing, headache or visual disturbances   Complete by: As directed    Call MD for:  redness, tenderness, or signs of infection (pain, swelling, redness, odor or green/yellow discharge around incision site)   Complete by: As directed    Diet - low sodium heart healthy   Complete by: As directed    Discharge instructions   Complete by: As directed    Radial Site Care Refer to this sheet in the next few weeks. These instructions provide you with information on caring for yourself after your procedure. Your caregiver may also give you more specific instructions. Your treatment has been planned according to current medical practices, but problems sometimes occur. Call your caregiver if you have any problems or questions after your procedure. HOME CARE INSTRUCTIONS You may shower the day after the procedure. Remove the bandage (dressing) and gently wash the site with plain soap and water. Gently pat the site dry.  Do not apply powder or lotion to the site.  Do not submerge the affected site in water for 3 to 5 days.  Inspect the site at least twice daily.  Do not flex or bend the affected arm for 24 hours.  No lifting over 5 pounds (2.3 kg) for 5 days after your procedure.  Do not drive home if you are  discharged the same day of the procedure. Have someone else drive you.  You may drive 24 hours after the procedure unless otherwise instructed by your caregiver.  What to expect: Any bruising will usually fade within 1 to 2 weeks.  Blood that collects in the tissue (hematoma) may be painful to the touch. It should usually decrease in size and tenderness within 1 to 2 weeks.  SEEK IMMEDIATE MEDICAL CARE IF: You have unusual pain at the radial site.  You have redness, warmth, swelling, or pain at the radial site.  You have drainage (other than a small amount of blood on the dressing).  You have chills.  You have a fever or persistent symptoms for more than 72 hours.  You have a fever and your symptoms suddenly get worse.  Your arm becomes pale, cool, tingly, or numb.  You have heavy bleeding from the site. Hold pressure on the site.   PLEASE DO NOT MISS ANY DOSES OF YOUR BRILINTA!!!!! Also keep a log of you blood pressures and bring back to your follow up appt. Please call the office with any questions.   Patients taking blood thinners should generally stay away from medicines like ibuprofen, Advil, Motrin, naproxen, and Aleve due to risk of stomach bleeding. You may take Tylenol as directed or talk to your primary doctor about alternatives.  PLEASE ENSURE THAT YOU DO NOT RUN OUT OF YOUR BRILINTA. This medication is very important to remain on for at least one year. IF you have issues obtaining this medication due to cost please CALL the office 3-5 business days prior to running out in order to prevent missing doses of this medication.   Increase activity slowly   Complete by: As directed        Discharge Medications   Allergies as of 11/27/2022       Reactions   Ibuprofen Swelling   Swelling of face        Medication List     TAKE these medications    aspirin 81 MG chewable tablet Chew 1 tablet (81 mg total) by mouth daily.   atorvastatin 80 MG tablet Commonly known as:  LIPITOR Take 1 tablet (80 mg total) by mouth daily. Start taking on: November 28, 2022 What changed:  medication strength how much to take   losartan 25 MG tablet Commonly known as: COZAAR Take 25 mg by mouth daily.   metoprolol succinate 25 MG 24 hr tablet Commonly known as: Toprol XL Take 1 tablet (25 mg total) by mouth daily.   multivitamin tablet Take 1 tablet by mouth daily.   nitroGLYCERIN 0.4 MG SL tablet Commonly known as: NITROSTAT Place 1 tablet (0.4 mg total) under the tongue every 5 (five) minutes as needed for chest pain.   ticagrelor 90 MG Tabs tablet Commonly known as: BRILINTA Take 1 tablet (90 mg total) by mouth 2 (two) times daily.         Outstanding Labs/Studies   FLP/LFTs in 8 weeks   Duration of Discharge Encounter   Greater than 30 minutes including physician time.  Signed, Laverda Page, NP 11/27/2022, 11:51 AM  Patient seen, examined. Available data reviewed. Agree with findings, assessment, and plan as outlined by Laverda Page, NP. On exam, he is alert, oriented, in NAD. Lungs CTA, heart RRR no murmur, abdomen soft and NT, extremities without edema. He continues to do well in his early recovery after an MI involving the left circumflex. Agree with medical therapy for residual CAD. LVEF 55%. Post-MI Rx = ASA, ticagrelor, atorva 80 mg, losartan, and metoprolol succinate. Medically stable for DC with follow-up plans as outlined above.   Tonny Bollman, M.D. 11/27/2022 2:55 PM

## 2022-11-27 NOTE — Discharge Instructions (Signed)

## 2022-11-27 NOTE — Progress Notes (Addendum)
Pt left the hospital prior to his TOC medications being delivered to his hospital room.  Patient's phone was called and message was left.  Patient's next of kin (brother) was also called and spoken with.  Importance of patient returning to get his prescription and not missing any doses of his Brilinta was explained.  Brother said he would get in touch with patient.  [place] provider made aware.

## 2022-11-27 NOTE — TOC Transition Note (Signed)
Transition of Care [facility]) - CM/SW Discharge Note   Patient Details  Name: Tyler James MRN: [id number] Date of Birth: 05-10-1961  Transition of Care [facility]) CM/SW Contact:  Levonne Lapping, RN Phone Number: 11/27/2022, 11:53 AM   Clinical Narrative:     Reviewed and discussed patient status during Progression. Patient is stable for transition to home with no needs  PCP is established and confirmed by Patient . Family to transport home . No TOC needs identified           Patient Goals and CMS Choice      Discharge Placement                         Discharge Plan and Services Additional resources added to the After Visit Summary for                                       Social Determinants of Health (SDOH) Interventions SDOH Screenings   Tobacco Use: High Risk (11/26/2022)     Readmission Risk Interventions     No data to display

## 2022-11-27 NOTE — Progress Notes (Signed)
Order received to discharge patient.  Telemetry monitor removed and CCMD notified.  PIV access removed x2.  Discharge instructions, follow up, medications and instructions for their use discussed with patient. 

## 2022-11-27 NOTE — Progress Notes (Signed)
CARDIAC REHAB PHASE I   PRE:  Rate/Rhythm: 60 SR  BP:  Sitting: 105/54      SaO2: 100 RA  MODE:  Ambulation: 470 ft   POST:  Rate/Rhythm: 62 SR  BP:  Sitting: 124/64      SaO2: 100 RA  Pt ambulated independently in hall using front wheel walker. Tolerated well with no CP or SOB. Returned to bed with call bell and bedside table in reach. Reviewed education provided yesterday. All questions and concerns addressed. Pt is hopeful for discharge home today.   [id number]  Vanessa Barbara, RN BSN 11/27/2022 10:55 AM

## 2022-11-29 NOTE — Progress Notes (Signed)
Patient has STEMI on 11-26-2022, underwent LHC w/ DES and is on Brilinta now. Anticipate rescheduling of his Right conversion to Total Hip by Dr. Zachery Dakins.     COVID Vaccine received:  _0  No _1  Yes Date of any COVID positive Test in last 90 days:  PCP - Rosita Fire, MD Cardiologist - Carlyle Dolly, MD   Sherren Mocha, MD   Chest x-ray -  EKG -  11-27-2022  epic Stress Test -  ECHO -  Cardiac Cath -11-26-2022     LHC w/ DES   PCR screen: _2  Ordered & Completed                      _3   No Order but Needs PROFEND                      _4   N/A for this surgery  Surgery Plan:  _5  Ambulatory                            _6  Outpatient in bed                            _7  Admit  Anesthesia:    _8  General  _9  Spinal                           _10   Choice _11   MAC  Bowel Prep - _12  No  _13   Yes _____________  Pacemaker / ICD device _14  No _15  Yes        Device order form faxed _16  No    _17   Yes      Faxed to:  Spinal Cord Stimulator:_18  No _19  Yes      (Remind patient to bring remote DOS) Other Implants:   History of Sleep Apnea? _20  No _21  Yes   CPAP used?- _22  No _23  Yes    Does the patient monitor blood sugar? _24  No _25  Yes  _26  N/A Does patient have a [employer] or Dexacom? _27  No _28  Yes   Fasting Blood Sugar Ranges-  Checks Blood Sugar _____ times a day  Last dose of GLP1 agonist-  GLP1 instructions:  Last dose of SGLT-2 inhibitors-  SGLT-2 instructions:   Blood Thinner / Instructions: Aspirin Instructions:  ERAS Protocol Ordered: _29  No  _30  Yes PRE-SURGERY _31  ENSURE  _32  G2  _33  No Drink Ordered  Patient is to be NPO after:   Comments:   Activity level: Patient can / can not climb a flight of stairs without difficulty; _34  No CP  _35  No SOB, but would have ______   Patient can / can not perform ADLs without assistance.   Anesthesia review:   Patient denies shortness of breath, fever, cough and chest pain at PAT appointment.  Patient verbalized understanding and  agreement to the Pre-Surgical Instructions that were given to them at this PAT appointment. Patient was also educated of the need to review these PAT instructions again prior to his/her surgery.I reviewed the appropriate phone numbers to call if they have any and questions or concerns.

## 2022-11-30 DIAGNOSIS — M25551 Pain in right hip: Secondary | ICD-10-CM | POA: Diagnosis not present

## 2022-11-30 DIAGNOSIS — I5022 Chronic systolic (congestive) heart failure: Secondary | ICD-10-CM | POA: Diagnosis not present

## 2022-12-01 ENCOUNTER — Encounter (HOSPITAL_COMMUNITY)
Admission: RE | Admit: 2022-12-01 | Discharge: 2022-12-01 | Disposition: A | Discharge status: Home / Self Care | Payer: 59 | Source: Ambulatory Visit | Attending: Internal Medicine | Admitting: Internal Medicine

## 2022-12-02 ENCOUNTER — Encounter (HOSPITAL_COMMUNITY): Payer: Medicare Other

## 2022-12-04 ENCOUNTER — Other Ambulatory Visit: Payer: Self-pay | Admitting: *Deleted

## 2022-12-04 ENCOUNTER — Ambulatory Visit: Payer: 59 | Attending: Nurse Practitioner | Admitting: Nurse Practitioner

## 2022-12-04 ENCOUNTER — Encounter: Payer: Self-pay | Admitting: Nurse Practitioner

## 2022-12-04 VITALS — BP 124/68 | HR 72 | Ht 69.0 in | Wt 171.6 lb

## 2022-12-04 DIAGNOSIS — I255 Ischemic cardiomyopathy: Secondary | ICD-10-CM | POA: Diagnosis not present

## 2022-12-04 DIAGNOSIS — I10 Essential (primary) hypertension: Secondary | ICD-10-CM

## 2022-12-04 DIAGNOSIS — E785 Hyperlipidemia, unspecified: Secondary | ICD-10-CM

## 2022-12-04 DIAGNOSIS — Z7689 Persons encountering health services in other specified circumstances: Secondary | ICD-10-CM | POA: Diagnosis not present

## 2022-12-04 DIAGNOSIS — I2510 Atherosclerotic heart disease of native coronary artery without angina pectoris: Secondary | ICD-10-CM | POA: Diagnosis not present

## 2022-12-04 DIAGNOSIS — Z79899 Other long term (current) drug therapy: Secondary | ICD-10-CM | POA: Diagnosis not present

## 2022-12-04 MED ORDER — NITROGLYCERIN 0.4 MG SL SUBL: 0.4000 mg | SUBLINGUAL_TABLET | SUBLINGUAL | 3 refills | Status: DC | PRN

## 2022-12-04 NOTE — Progress Notes (Signed)
Cardiology Office Note:    Date:  12/04/2022  ID:  Tyler James, DOB [birth year], MRN [id number]  PCP:  Carrolyn Meiers, [place] [facility] Cardiologist:  Carlyle Dolly, MD     Referring MD: Carrolyn Meiers*   CC: Here for STEMI follow-up  History of Present Illness:    Tyler James is a 62 y.o. male with a hx of the following:  CAD, s/p NSTEMI in 2017, s/p DES to OM2 and RCA, s/p STEMI 2024 Hyperlipidemia Ischemic cardiomyopathy Hypertension Former smoker  Was seen for preop cardiovascular risk assessment on October 24, 2022.  Denied any chest pain at that time.  Was able to meet 4 METS of activity.  Did not need any further cardiovascular testing prior to surgery.  Was told to follow-up in 2 months with Dr. Harl Bowie or APP.  Presented to [facility] ED with shortness of breath on November 16, 2022.  Workup in ED showed normal troponins.  D-dimer was elevated but within age-adjusted limits, so CTA was not indicated.  EKG did show initially some ST depressions which normalized on second EKG.  Cardiology was consulted and outpatient stress testing was recommended.  Stress test performed on November 24, 2022 revealed downsloping ST depressions 2 mm in inferior leads during stress.  EKG was positive for ischemia.  Abnormal LV perfusion with small defect noted with mild reduction in uptake present in mid to basal inferior locations that was partially reversible.  Abnormal wall motion noted in defect area.  Findings were consistent with prior MI with peri-infarct ischemia, study was determined to be low risk with normal EF.  Presented to [facility] ED with chest pain on November 17, 2022.  Described as central chest pressure with associated nausea and shortness of breath, similar symptoms that he experienced before.  EKG showed inferior STEMI with reciprocal depressions.  Was transferred to [facility] and underwent cardiac catheterization with ostial left  circumflex at 75%, proximal left circumflex 95% stenosis treated with PCI/DES x 1, overlapping previous stent placed, known 85% stenosis of OM1 and 55% stenosis of jailed ostial AV groove treated with PTCA.  Recommended for DAPT with aspirin and Brilinta for at least 1 year.  Troponin peaked at 4528.  It was recommended that Brilinta would likely be continued beyond a year at reduced dose.  Seen by cardiac rehab.  Was discharged in stable condition on November 27, 2022.  Today he presents for STEMI follow-up.  He denies any recurring chest pain. Denies any  shortness of breath, palpitations, syncope, presyncope, dizziness, orthopnea, PND, swelling or significant weight changes, acute bleeding, or claudication. Has officially stopped smoking. Requesting a referral to PCP and refill on Nitroglycerin. Denies any other questions or concerns.    Past Medical History:  Diagnosis Date   CAD (coronary artery disease)    a. 02/2016: STEMI - 100% stenosis OM2 w/ DES placed, POBA to LCx, and staged PCI of 95% stenosed RCA w/ DES.   Ischemic cardiomyopathy    a. 02/2016: EF 45-50% w/ diffuse inferolateral hypokinesis    Past Surgical History:  Procedure Laterality Date   CARDIAC CATHETERIZATION N/A 03/10/2016   Procedure: Left Heart Cath and Coronary Angiography;  Surgeon: Peter M Martinique, MD;  Location: [place] [facility];  Service: Cardiovascular;  Laterality: N/A;   CARDIAC CATHETERIZATION N/A 03/10/2016   Procedure: Coronary Stent Intervention;  Surgeon: Peter M Martinique, MD;  Location: [place] [facility];  Service: Cardiovascular;  Laterality: N/A;   CARDIAC CATHETERIZATION N/A 03/12/2016   Procedure: Coronary Stent Intervention;  Surgeon: Burnell Blanks, MD;  Location: [place] [facility];  Service: Cardiovascular;  Laterality: N/A;   CORONARY/GRAFT ACUTE MI REVASCULARIZATION N/A 11/26/2022   Procedure: Coronary/Graft Acute MI Revascularization;  Surgeon: Leonie Man, MD;  Location: [place]  [facility];  Service: Cardiovascular;  Laterality: N/A;   LEFT HEART CATH AND CORONARY ANGIOGRAPHY N/A 11/26/2022   Procedure: LEFT HEART CATH AND CORONARY ANGIOGRAPHY;  Surgeon: Leonie Man, MD;  Location: [place] [facility];  Service: Cardiovascular;  Laterality: N/A;   TOTAL HIP ARTHROPLASTY     bilateral    Current Medications: Current Meds  Medication Sig   aspirin 81 MG chewable tablet Chew 1 tablet (81 mg total) by mouth daily.   atorvastatin (LIPITOR) 80 MG tablet Take 1 tablet (80 mg total) by mouth daily.   losartan (COZAAR) 25 MG tablet Take 25 mg by mouth daily.   metoprolol succinate (TOPROL XL) 25 MG 24 hr tablet Take 1 tablet (25 mg total) by mouth daily.   Multiple Vitamin (MULTIVITAMIN) tablet Take 1 tablet by mouth daily.   ticagrelor (BRILINTA) 90 MG TABS tablet Take 1 tablet (90 mg total) by mouth 2 (two) times daily.   nitroGLYCERIN (NITROSTAT) 0.4 MG SL tablet Place 1 tablet (0.4 mg total) under the tongue every 5 (five) minutes as needed for chest pain.     Allergies:   Ibuprofen   Social History   Socioeconomic History   Marital status: Single    Spouse name: Not on file   Number of children: Not on file   Years of education: Not on file   Highest education level: Not on file  Occupational History   Occupation: currently unemployed.   Tobacco Use   Smoking status: Former    Packs/day: 1.00    Types: Cigarettes    Quit date: 11/13/2022    Years since quitting: 0.0   Smokeless tobacco: Never  Vaping Use   Vaping Use: Never used  Substance and Sexual Activity   Alcohol use: Yes    Comment: weekly   Drug use: No   Sexual activity: Yes  Other Topics Concern   Not on file  Social History Narrative   Not on file   Social Determinants of Health   Financial Resource Strain: Not on file  Food Insecurity: Not on file  Transportation Needs: Not on file  Physical Activity: Not on file  Stress: Not on file  Social Connections: Not on file    ROS:    Review of Systems  Constitutional: Negative.   HENT: Negative.    Eyes: Negative.   Respiratory: Negative.    Cardiovascular: Negative.   Gastrointestinal: Negative.   Genitourinary: Negative.   Musculoskeletal: Negative.   Skin: Negative.   Neurological: Negative.   Endo/Heme/Allergies: Negative.   Psychiatric/Behavioral: Negative.      Please see the history of present illness.    All other systems reviewed and are negative.  EKGs/Labs/Other Studies Reviewed:    The following studies were reviewed today:   EKG:  EKG is not ordered today.     Echocardiogram on 11/26/2022:  1. Left ventricular ejection fraction, by estimation, is 55%. The left  ventricle has normal function. The left ventricle demonstrates regional  wall motion abnormalities (see scoring diagram/findings for description).  Left ventricular diastolic  parameters were normal.   2. Right ventricular systolic function is normal. The right ventricular  size is normal. There is normal pulmonary artery systolic pressure. The  estimated right ventricular systolic pressure is 46.2 mmHg.   3. The mitral valve is grossly normal. Mild mitral valve regurgitation.  No evidence of mitral stenosis.   4. The aortic valve is tricuspid. There is mild calcification of the  aortic valve. There is mild thickening of the aortic valve. Aortic valve  regurgitation is not visualized. No aortic stenosis is present.   5. The inferior vena cava is dilated in size with >50% respiratory  variability, suggesting right atrial pressure of 8 mmHg.  Left heart cath on 11/26/2022:   CULPRIT LESION IN SEGMENT: Ost Cx lesion is 75% stenosed.  Ost Cx to Prox Cx lesion is 95% stenosed involving the distal edge of the previously placed stent..  The distal two thirds of the stent was widely patent.   A drug-eluting stent was successfully placed using a SYNERGY XD 4.0X20. ->  Postdilated to 4.5 mm.  Post intervention, there is a 0% residual  stenosis.   1st Mrg lesion is 85% stenosed.   LPAV lesion is 75% stenosed.   Mid LM to Prox LAD lesion is 5% stenosed.  Mid LAD lesion is 50% stenosed.   Previously placed Prox RCA stent of unknown type is  widely patent.   ----------------------------------------------------   There is mild left ventricular systolic dysfunction.   LV end diastolic pressure is moderately elevated.   The left ventricular ejection fraction is 45-50% by visual estimate.   There is no aortic valve stenosis.   POST-CATH DIAGNOSES: Culprit Lesion: Proximal LCx 95% stenosis (irregular/ulcerated involving proximal stent edge) associated with ostial 75% stenosis Successful DES PCI covering both lesions overlapping the previous stent with Synergy DES 4.0 x 20 mm postdilated to 4.5 mm. Known 85% stenosis of OM1, and previous 55% stenosis of jailed ostial AV groove LCx treated with PTCA at time of original stent Widely patent RCA stent Mild diffuse disease in the LAD with calcification in the proximal segment with moderate stenosis just after heavily diseased main diagonal branch.   Mildly reduced LV function with likely inferolateral hypokinesis.      RECOMMENDATIONS   He will be admitted for standard post PCI care.  => Cycle troponins, check FLP, CMP and A1c Beta-blocker, ARB, high-dose statin. Would recommend 2D echo to get better assessment.  LVEDP was 20 mmHg Smoking cessation counseling     Could consider FastTRACK discharge if stable.       Glenetta Hew, MD   Myoview on 11/25/2022:   Down sloping ST depression 2 mm in the inferior leads (II, III and aVF) was noted during pharmacological stress (present at rest). ECG is positive for ischemia.   LV perfusion is abnormal. There is a small defect with mild reduction in uptake present in the mid to basal inferior location(s) that is partially reversible. There is abnormal wall motion in the defect area. Findings are consistent with prior myocardial  infarction with peri-infarct ischemia. The study is low risk.   Left ventricular function is normal, 54%    Coronary stent intervention on 03/12/2016: Prox RCA lesion, 99% stenosed. Post intervention, there is a 0% residual stenosis.   1. Severe stenosis mid RCA 2. Successful PTCA/DES x 1 mid RCA   Recommendations: Will continue DAPT with ASA and Brilinta. Continue statin and beta blocker.   Left heart cath on 03/11/2016: Ost 1st Mrg lesion, 90% stenosed. Prox LAD lesion, 30% stenosed. Mid RCA lesion, 95% stenosed. Ost 2nd Mrg  to 2nd Mrg lesion, 100% stenosed. Post intervention, there is a 0% residual stenosis. Prox Cx lesion, 50% stenosed. Post intervention, there is a 10% residual stenosis.   1. 2 vessel obstructive CAD. The culprit lesion is occlusion of a large OM2 branch. There is severe mid RCA stenosis. The first OM is very small. 2. Successful stenting of the second OM with DES. POBA of the distal LCx through the stent struts.   Plan: DAPT for one year. Recommend PCI of the RCA prior to DC. Will assess LV function with Echo.   Recent Labs: 11/26/2022: ALT 43; BUN 17; Creatinine, Ser 0.93; Hemoglobin 12.9; Platelets 177; Potassium 4.1; Sodium 136  Recent Lipid Panel    Component Value Date/Time   CHOL 117 11/26/2022 0336   TRIG 54 11/26/2022 0336   HDL 53 11/26/2022 0336   CHOLHDL 2.2 11/26/2022 0336   VLDL 11 11/26/2022 0336   LDLCALC 53 11/26/2022 0336    Physical Exam:    VS:  BP 124/68   Pulse 72   Ht 5\' 9"  (1.753 m)   Wt 171 lb 9.6 oz (77.8 kg)   SpO2 97%   BMI 25.34 kg/m     Wt Readings from Last 3 Encounters:  12/04/22 171 lb 9.6 oz (77.8 kg)  11/26/22 165 lb (74.8 kg)  11/16/22 165 lb (74.8 kg)     GEN: Well nourished, well developed in no acute distress HEENT: Normal NECK: No JVD; No carotid bruits CARDIAC: S1/S2, RRR, no murmurs, rubs, gallops; 2+ pulses; radial site healing well RESPIRATORY:  Clear to auscultation without rales, wheezing or  rhonchi  MUSCULOSKELETAL:  No edema; No deformity  SKIN: Warm and dry NEUROLOGIC:  Alert and oriented x 3 PSYCHIATRIC:  Normal affect   ASSESSMENT:    1. Coronary artery disease involving native heart without angina pectoris, unspecified vessel or lesion type   2. Hyperlipidemia LDL goal <70   3. Ischemic cardiomyopathy   4. Hypertension, unspecified type   5. Medication management   6. Encounter to establish care with new doctor    PLAN:    In order of problems listed above:  CAD, s/p NSTEMI in 2017, s/p DES to OM2 and RCA, s/p STEMI in 2024 Underwent cardiac catheterization during recent hospitalization that revealed ostial left circumflex at 75%, proximal left circumflex 95% stenosis treated with PCI/DES x 1, overlapping previous stent placed, known 85% stenosis of OM1 and 55% stenosis of jailed ostial AV groove treated with PTCA.  Recommended for DAPT with aspirin and Brilinta for at least 1 year.  Continue aspirin, Lipitor, losartan, Toprol-XL, Brilinta, nitroglycerin as needed.  We will refill nitroglycerin per his request. Heart healthy diet and regular cardiovascular exercise encouraged.   Hyperlipidemia Lipid profile during hospitalization revealed total cholesterol 117, triglycerides 54, HDL 53, and LDL 53.  Continue atorvastatin. Heart healthy diet and regular cardiovascular exercise encouraged.  Will obtain FLP and LFT in 2 months per protocol.  History of ischemic cardiomyopathy Echocardiogram during hospitalization revealed EF 55%, regional wall motion abnormalities noted along the left ventricle, left ventricular diastolic parameters were normal. Euvolemic and well compensated on exam.  Continue current medication regimen. Low sodium diet, fluid restriction <2L, and daily weights encouraged. Educated to contact our office for weight gain of 2 lbs overnight or 5 lbs in one week. Heart healthy diet and regular cardiovascular exercise encouraged.   Hypertension Blood pressure  stable today.  Continue current medication regimen. Discussed to monitor BP at home at least 2  hours after medications and sitting for 5-10 minutes. Heart healthy diet and regular cardiovascular exercise encouraged.   5.  Encounter to establish new doctor Requesting to see new primary care provider.  Per his request, will refer to primary care provider.  6.  Disposition: Follow-up with Dr. Dina Rich in 6 months or sooner if anything changes.  Medication Adjustments/Labs and Tests Ordered: Current medicines are reviewed at length with the patient today.  Concerns regarding medicines are outlined above.  Orders Placed This Encounter  Procedures   Lipid panel   Hepatic function panel   Ambulatory referral to [facility]   Meds ordered this encounter  Medications   nitroGLYCERIN (NITROSTAT) 0.4 MG SL tablet    Sig: Place 1 tablet (0.4 mg total) under the tongue every 5 (five) minutes as needed for chest pain.    Dispense:  25 tablet    Refill:  3    Patient Instructions  Medication Instructions:  Nitroglycerin refilled today  Continue all other medications.     Labwork: BNP, Mg - do next week FLP, LFT  - do in 2 months - Reminder:  Nothing to eat or drink after 12 midnight prior to labs. Orders given today for both  Office will contact with results via phone, letter or mychart.     Testing/Procedures: none  Follow-Up: 6 months - Dr. Wyline Mood  Any Other Special Instructions Will Be Listed Below (If Applicable). You have been referred to [facility]     If you need a refill on your cardiac medications before your next appointment, please call your pharmacy.    Signed, Sharlene Dory, NP  12/05/2022 8:44 PM    [facility]

## 2022-12-04 NOTE — Patient Instructions (Signed)
Medication Instructions:  Nitroglycerin refilled today  Continue all other medications.     Labwork: BNP, Mg - do next week FLP, LFT  - do in 2 months - Reminder:  Nothing to eat or drink after 12 midnight prior to labs. Orders given today for both  Office will contact with results via phone, letter or mychart.     Testing/Procedures: none  Follow-Up: 6 months - Dr. Harl Bowie  Any Other Special Instructions Will Be Listed Below (If Applicable). You have been referred to [facility]     If you need a refill on your cardiac medications before your next appointment, please call your pharmacy.

## 2022-12-09 ENCOUNTER — Ambulatory Visit: Payer: Medicare Other | Admitting: Cardiology

## 2022-12-10 ENCOUNTER — Inpatient Hospital Stay: Admit: 2022-12-10 | Payer: 59 | Admitting: Orthopedic Surgery

## 2022-12-10 SURGERY — CONVERSION, PREVIOUS HIP SURGERY, TO TOTAL HIP ARTHROPLASTY
Anesthesia: Spinal | Site: Hip | Laterality: Right

## 2022-12-25 ENCOUNTER — Ambulatory Visit: Payer: 59 | Admitting: Internal Medicine

## 2022-12-31 DIAGNOSIS — M25551 Pain in right hip: Secondary | ICD-10-CM | POA: Diagnosis not present

## 2022-12-31 DIAGNOSIS — I5022 Chronic systolic (congestive) heart failure: Secondary | ICD-10-CM | POA: Diagnosis not present

## 2023-01-23 ENCOUNTER — Ambulatory Visit: Payer: Medicare Other | Admitting: Cardiology

## 2023-01-29 DIAGNOSIS — M25551 Pain in right hip: Secondary | ICD-10-CM | POA: Diagnosis not present

## 2023-01-29 DIAGNOSIS — I5022 Chronic systolic (congestive) heart failure: Secondary | ICD-10-CM | POA: Diagnosis not present

## 2023-02-24 ENCOUNTER — Telehealth: Payer: Self-pay | Admitting: Cardiology

## 2023-02-24 MED ORDER — TICAGRELOR 90 MG PO TABS: 90.0000 mg | ORAL_TABLET | Freq: Two times a day (BID) | ORAL | 2 refills | Status: AC

## 2023-02-24 NOTE — Telephone Encounter (Signed)
*  STAT* If patient is at the pharmacy, call can be transferred to refill team.   1. Which medications need to be refilled? (please list name of each medication and dose if known)   ticagrelor (BRILINTA) 90 MG TABS tablet   2. Which pharmacy/location (including street and city if local pharmacy) is medication to be sent to?  [id number] - [place], [place] - [address] AT [address]   3. Do they need a 30 day or 90 day supply?   90 day  Patient stated he only has 2 more doses of this medication.

## 2023-02-24 NOTE — Telephone Encounter (Signed)
Refill complete 

## 2023-03-02 ENCOUNTER — Encounter: Payer: Self-pay | Admitting: *Deleted

## 2023-03-04 DIAGNOSIS — M25551 Pain in right hip: Secondary | ICD-10-CM | POA: Diagnosis not present

## 2023-03-04 DIAGNOSIS — I5022 Chronic systolic (congestive) heart failure: Secondary | ICD-10-CM | POA: Diagnosis not present

## 2023-04-03 DIAGNOSIS — M25551 Pain in right hip: Secondary | ICD-10-CM | POA: Diagnosis not present

## 2023-04-03 DIAGNOSIS — I5022 Chronic systolic (congestive) heart failure: Secondary | ICD-10-CM | POA: Diagnosis not present

## 2023-04-04 ENCOUNTER — Emergency Department (HOSPITAL_COMMUNITY): Payer: 59

## 2023-04-04 ENCOUNTER — Other Ambulatory Visit: Payer: Self-pay

## 2023-04-04 ENCOUNTER — Encounter (HOSPITAL_COMMUNITY): Payer: Self-pay

## 2023-04-04 ENCOUNTER — Emergency Department (HOSPITAL_COMMUNITY)
Admission: EM | Admit: 2023-04-04 | Discharge: 2023-04-04 | Disposition: A | Discharge status: Home / Self Care | Payer: 59 | Attending: Emergency Medicine | Admitting: Emergency Medicine

## 2023-04-04 DIAGNOSIS — Z7982 Long term (current) use of aspirin: Secondary | ICD-10-CM | POA: Diagnosis not present

## 2023-04-04 DIAGNOSIS — M25551 Pain in right hip: Secondary | ICD-10-CM | POA: Diagnosis not present

## 2023-04-04 MED ORDER — HYDROCODONE-ACETAMINOPHEN 5-325 MG PO TABS
2.0000 | ORAL_TABLET | Freq: Once | ORAL | Status: AC
  Administered 2023-04-04: 2 via ORAL
  Filled 2023-04-04: qty 2

## 2023-04-04 NOTE — ED Triage Notes (Signed)
Pt c/o right hip pain for weeks. Pt was supposed to have surgery on his hip and it got rescheduled d/t he is on blood thinners.

## 2023-04-04 NOTE — ED Provider Notes (Signed)
[place] EMERGENCY DEPARTMENT AT [facility] Provider Note   CSN: [id number] Arrival date & time: 04/04/23  1104     History  Chief Complaint  Patient presents with   Hip Pain   HPI Tyler James is a 62 y.o. male with history of bilateral hip replacements presenting for hip pain.  Pain started 2 weeks but has gotten acutely worse in the last 2 days and is located in the right hip.  States that now he is having difficulty ambulating and bearing weight.  It is painful to move his hip in all directions.  States he was planned to have another hip replacement in February but it was canceled due to being started on a blood thinner after coronary/graft for acute MI revascularization procedure performed in January of this year.  Also states that the right leg is noticeably shorter than the left.   Hip Pain       Home Medications Prior to Admission medications   Medication Sig Start Date End Date Taking? Authorizing Provider  aspirin 81 MG chewable tablet Chew 1 tablet (81 mg total) by mouth daily. 03/13/16   Strader, Lennart Pall, PA-C  atorvastatin (LIPITOR) 80 MG tablet Take 1 tablet (80 mg total) by mouth daily. 11/28/22   Arty Baumgartner, NP  losartan (COZAAR) 25 MG tablet Take 25 mg by mouth daily.    [provider]  metoprolol succinate (TOPROL XL) 25 MG 24 hr tablet Take 1 tablet (25 mg total) by mouth daily. 10/24/22   Sharlene Dory, PA-C  Multiple Vitamin (MULTIVITAMIN) tablet Take 1 tablet by mouth daily.    [provider]  nitroGLYCERIN (NITROSTAT) 0.4 MG SL tablet Place 1 tablet (0.4 mg total) under the tongue every 5 (five) minutes as needed for chest pain. 12/04/22   Sharlene Dory, NP  ticagrelor (BRILINTA) 90 MG TABS tablet Take 1 tablet (90 mg total) by mouth 2 (two) times daily. 02/24/23   Antoine Poche, MD      Allergies    Ibuprofen    Review of Systems   See HPI for pertinent positives  Physical Exam Updated Vital Signs BP  134/78   Pulse 71   Temp 97.7 F (36.5 C) (Oral)   Resp 17   Ht 5\' 9"  (1.753 m)   Wt 74.8 kg   SpO2 99%   BMI 24.37 kg/m  Physical Exam Constitutional:      Appearance: Normal appearance.  HENT:     Head: Normocephalic.     Nose: Nose normal.  Eyes:     Conjunctiva/sclera: Conjunctivae normal.  Pulmonary:     Effort: Pulmonary effort is normal.  Musculoskeletal:     Right hip: Tenderness present. No deformity, bony tenderness or crepitus. Decreased range of motion. Normal strength.     Left hip: Normal.     Comments: Right Leg approximately 2 inches shorter than left when lying supine.  Active and passive range of motion of the left hip normal.  Unable to range the right hip due to pain with any type of movement.  Patient unable to ambulate or bear weight on the right leg.  Pedal pulses 2+.  Cap refill is brisk distally.  Sensation intact.  Able to wiggle the toes in both feet.  No areas of swelling, erythema, and hip joint is not warm.  Neurological:     Mental Status: He is alert.  Psychiatric:        Mood and Affect: Mood  normal.     ED Results / Procedures / Treatments   Labs (all labs ordered are listed, but only abnormal results are displayed) Labs Reviewed - No data to display  EKG None  Radiology DG Hip Unilat With Pelvis 2-3 Views Right  Result Date: 04/04/2023 CLINICAL DATA:  Right hip pain EXAM: DG HIP (WITH OR WITHOUT PELVIS) 2-3V RIGHT COMPARISON:  06/06/2022, 10/17/2022 FINDINGS: Bilateral total hip arthroplasties. No periprosthetic lucency or fracture. Varus angulation of the right hip is unchanged compared to the previous CT. There is some osseous remodeling along the superior aspect of the right femoral neck and superolateral acetabulum, unchanged. No pelvic fracture or diastasis. IMPRESSION: Bilateral total hip arthroplasties without fracture or dislocation. No significant interval change in the appearance of the right hip compared to the previous CT.  Electronically Signed   By: Duanne Guess D.O.   On: 04/04/2023 13:34    Procedures Procedures    Medications Ordered in ED Medications  HYDROcodone-acetaminophen (NORCO/VICODIN) 5-325 MG per tablet 2 tablet (2 tablets Oral Given 04/04/23 1354)    ED Course/ Medical Decision Making/ A&P Clinical Course as of 04/04/23 1501  Sat Apr 04, 2023  1400 Treated pain with Norco.  Reassessed and patient stated mobility in his leg had improved somewhat.  X-ray did not reveal dislocation or fracture but there is some new angulation and the hardware in his right hip may be causing the discrepancy in length of his legs.  Ordered crutches for home use.  Advised for him to follow-up with his orthopedic surgeon to discuss plans moving forward. [JR]    Clinical Course User Index [JR] Gareth Eagle, PA-C                             Medical Decision Making Amount and/or Complexity of Data Reviewed Radiology: ordered.  Risk Prescription drug management.   62 year old well-appearing male presenting for hip pain.  Exam notable for limited mobility in the right hip and right leg noticeably shorter than the left.  DDx includes hip dislocation, fracture, septic joint, avascular necrosis of the femoral head. X-ray of the hip was negative for acute process. Treat his pain with Norco.  Patient was able to move his hip more freely after pain control. Discussed that he likely needs to follow-up with his orthopedic surgeon plan moving forward.  Provided crutches for patient. Vital stable at discharge.  Discharged home.        Final Clinical Impression(s) / ED Diagnoses Final diagnoses:  Right hip pain    Rx / DC Orders ED Discharge Orders     None         Gareth Eagle, PA-C 04/04/23 1501    Terrilee Files, MD 04/04/23 1736

## 2023-04-04 NOTE — Discharge Instructions (Addendum)
Evaluation of the right hip pain was overall reassuring.  X-ray was negative for acute injury.  There are some noticeable angulation in and the hardware in your right hip in comparison to your left that is likely attributing to the leg shortening in the right leg.  Recommend you follow-up with your orthopedic doctor for further evaluation and management.  At this time recommend using crutches for ambulation until you are formally evaluated by Ortho.  If you have new swelling, erythema, fever or chills, worsening pain in the hip, or any other concerning symptom please return emergency department further evaluation.

## 2023-04-09 ENCOUNTER — Telehealth: Payer: Self-pay | Admitting: *Deleted

## 2023-04-09 DIAGNOSIS — M25551 Pain in right hip: Secondary | ICD-10-CM | POA: Diagnosis not present

## 2023-04-09 NOTE — Telephone Encounter (Signed)
   Pre-operative Risk Assessment    Patient Name: Tyler James  DOB: 1961-08-12 MRN: [id number]      Request for Surgical Clearance    Procedure:   PERIPROSTHETIC Fx: CONVERSION TO RIGHT TOTAL HIP ARTHROPLASTY  Date of Surgery:  Clearance TBD URGENT ASAP                                Surgeon:  DR. DANIEL MARCHWIANY Surgeon's Group or Practice Name:  [facility] Phone number:  [phone] EXT [phone] ATTN: KELLY HIGH Fax number:  [phone]   Type of Clearance Requested:   - Medical  - Pharmacy:  Hold Ticagrelor (Brilinta)     Type of Anesthesia:  Spinal   Additional requests/questions:    Elpidio Anis   04/09/2023, 8:18 AM

## 2023-04-09 NOTE — Telephone Encounter (Signed)
Good morning Dr. Wyline Mood,  We have received a surgical clearance request for urgent right total hip arthroplasty.  He has a past medical history of CAD s/p NSTEMI 2017 treated with DES to OM 2 and RCA with recent STEMI in 11/2022 treated with overlapping PCI/DES to proximal left circumflex, HLD, HTN, ICM.  Can you please offer guidance on holding Brilinta and ASA for patient's upcoming total hip replacement. Please forward you guidance and recommendations to P CV DIV PREOP.  Thank you very much for your time and looking forward to your recommendation.  Thank you, Robin Searing, NP

## 2023-04-10 ENCOUNTER — Telehealth: Payer: Self-pay | Admitting: *Deleted

## 2023-04-10 NOTE — Telephone Encounter (Signed)
Transition Care Management Follow-up Telephone Call Date of discharge and from where: 5/18  [facility] How have you been since you were released from the hospital? In pain  Any questions or concerns? Started talking by saying  dc papers gave by  the ed missed his broken hip even with xray's as he then  went to different dr and Jillyn Hidden said it was broken . So he wanted to talk to patient experience in the future provided that information and he answered the other questions   Items Reviewed: Did the pt receive and understand the discharge instructions provided? Yes  Medications obtained and verified? No  Other? No  Any new allergies since your discharge? No  Dietary orders reviewed? No Do you have support at home? Yes  papers stating the ed missed his broken hip even with xrays went to different dr and Jillyn Hidden said it was broken . So he wanted to talk to patient experience in the future   Follow up appointments reviewed: Are transportation arrangements needed? No  If their condition worsens, is the pt aware to call PCP or go to the Emergency Dept.? Yes Was the patient provided with contact information for the PCP's office or ED? Yes Was to pt encouraged to call back with questions or concerns? Yes

## 2023-04-14 ENCOUNTER — Other Ambulatory Visit: Payer: Self-pay

## 2023-04-14 ENCOUNTER — Inpatient Hospital Stay (HOSPITAL_COMMUNITY)
Admission: AD | Admit: 2023-04-14 | Discharge: 2023-04-20 | DRG: 467 | Disposition: A | Discharge status: Home Health | Payer: 59 | Source: Ambulatory Visit | Attending: Internal Medicine | Admitting: Internal Medicine

## 2023-04-14 ENCOUNTER — Encounter (HOSPITAL_COMMUNITY): Payer: Self-pay

## 2023-04-14 ENCOUNTER — Encounter (HOSPITAL_COMMUNITY): Payer: Self-pay | Admitting: Internal Medicine

## 2023-04-14 DIAGNOSIS — R7303 Prediabetes: Secondary | ICD-10-CM | POA: Diagnosis not present

## 2023-04-14 DIAGNOSIS — I10 Essential (primary) hypertension: Secondary | ICD-10-CM | POA: Diagnosis present

## 2023-04-14 DIAGNOSIS — Z955 Presence of coronary angioplasty implant and graft: Secondary | ICD-10-CM

## 2023-04-14 DIAGNOSIS — Z96649 Presence of unspecified artificial hip joint: Secondary | ICD-10-CM | POA: Diagnosis not present

## 2023-04-14 DIAGNOSIS — I25810 Atherosclerosis of coronary artery bypass graft(s) without angina pectoris: Secondary | ICD-10-CM | POA: Diagnosis not present

## 2023-04-14 DIAGNOSIS — I252 Old myocardial infarction: Secondary | ICD-10-CM

## 2023-04-14 DIAGNOSIS — M84351A Stress fracture, right femur, initial encounter for fracture: Principal | ICD-10-CM | POA: Diagnosis present

## 2023-04-14 DIAGNOSIS — Z9861 Coronary angioplasty status: Secondary | ICD-10-CM | POA: Diagnosis not present

## 2023-04-14 DIAGNOSIS — E8809 Other disorders of plasma-protein metabolism, not elsewhere classified: Secondary | ICD-10-CM | POA: Diagnosis present

## 2023-04-14 DIAGNOSIS — Z471 Aftercare following joint replacement surgery: Secondary | ICD-10-CM | POA: Diagnosis not present

## 2023-04-14 DIAGNOSIS — Z7401 Bed confinement status: Secondary | ICD-10-CM

## 2023-04-14 DIAGNOSIS — M9701XA Periprosthetic fracture around internal prosthetic right hip joint, initial encounter: Secondary | ICD-10-CM | POA: Diagnosis not present

## 2023-04-14 DIAGNOSIS — Z96641 Presence of right artificial hip joint: Secondary | ICD-10-CM | POA: Diagnosis not present

## 2023-04-14 DIAGNOSIS — I2510 Atherosclerotic heart disease of native coronary artery without angina pectoris: Secondary | ICD-10-CM

## 2023-04-14 DIAGNOSIS — Z7982 Long term (current) use of aspirin: Secondary | ICD-10-CM | POA: Diagnosis not present

## 2023-04-14 DIAGNOSIS — M898X9 Other specified disorders of bone, unspecified site: Secondary | ICD-10-CM | POA: Diagnosis not present

## 2023-04-14 DIAGNOSIS — R739 Hyperglycemia, unspecified: Secondary | ICD-10-CM | POA: Diagnosis not present

## 2023-04-14 DIAGNOSIS — Z7902 Long term (current) use of antithrombotics/antiplatelets: Secondary | ICD-10-CM | POA: Diagnosis not present

## 2023-04-14 DIAGNOSIS — Z96642 Presence of left artificial hip joint: Secondary | ICD-10-CM | POA: Diagnosis present

## 2023-04-14 DIAGNOSIS — E876 Hypokalemia: Secondary | ICD-10-CM | POA: Diagnosis present

## 2023-04-14 DIAGNOSIS — Z720 Tobacco use: Secondary | ICD-10-CM | POA: Diagnosis not present

## 2023-04-14 DIAGNOSIS — E785 Hyperlipidemia, unspecified: Secondary | ICD-10-CM | POA: Diagnosis not present

## 2023-04-14 DIAGNOSIS — Z4802 Encounter for removal of sutures: Secondary | ICD-10-CM | POA: Diagnosis not present

## 2023-04-14 DIAGNOSIS — F17210 Nicotine dependence, cigarettes, uncomplicated: Secondary | ICD-10-CM | POA: Diagnosis not present

## 2023-04-14 DIAGNOSIS — F17200 Nicotine dependence, unspecified, uncomplicated: Secondary | ICD-10-CM | POA: Diagnosis not present

## 2023-04-14 DIAGNOSIS — E782 Mixed hyperlipidemia: Secondary | ICD-10-CM

## 2023-04-14 DIAGNOSIS — I255 Ischemic cardiomyopathy: Secondary | ICD-10-CM | POA: Diagnosis present

## 2023-04-14 DIAGNOSIS — Z886 Allergy status to analgesic agent status: Secondary | ICD-10-CM | POA: Diagnosis not present

## 2023-04-14 DIAGNOSIS — D509 Iron deficiency anemia, unspecified: Secondary | ICD-10-CM | POA: Diagnosis not present

## 2023-04-14 DIAGNOSIS — M978XXS Periprosthetic fracture around other internal prosthetic joint, sequela: Secondary | ICD-10-CM | POA: Diagnosis not present

## 2023-04-14 DIAGNOSIS — Z79899 Other long term (current) drug therapy: Secondary | ICD-10-CM

## 2023-04-14 DIAGNOSIS — Z4801 Encounter for change or removal of surgical wound dressing: Secondary | ICD-10-CM | POA: Diagnosis not present

## 2023-04-14 DIAGNOSIS — M25551 Pain in right hip: Secondary | ICD-10-CM | POA: Diagnosis not present

## 2023-04-14 HISTORY — DX: Essential (primary) hypertension: I10

## 2023-04-14 LAB — CBC WITH DIFFERENTIAL/PLATELET
Abs Immature Granulocytes: 0.01 10*3/uL (ref 0.00–0.07)
Basophils Absolute: 0.1 10*3/uL (ref 0.0–0.1)
Basophils Relative: 1 %
Eosinophils Absolute: 0.1 10*3/uL (ref 0.0–0.5)
Eosinophils Relative: 2 %
HCT: 37.5 % — ABNORMAL LOW (ref 39.0–52.0)
Hemoglobin: 12 g/dL — ABNORMAL LOW (ref 13.0–17.0)
Immature Granulocytes: 0 %
Lymphocytes Relative: 23 %
Lymphs Abs: 1.1 10*3/uL (ref 0.7–4.0)
MCH: 24.6 pg — ABNORMAL LOW (ref 26.0–34.0)
MCHC: 32 g/dL (ref 30.0–36.0)
MCV: 77 fL — ABNORMAL LOW (ref 80.0–100.0)
Monocytes Absolute: 0.5 10*3/uL (ref 0.1–1.0)
Monocytes Relative: 10 %
Neutro Abs: 3.2 10*3/uL (ref 1.7–7.7)
Neutrophils Relative %: 64 %
Platelets: 228 10*3/uL (ref 150–400)
RBC: 4.87 MIL/uL (ref 4.22–5.81)
RDW: 14.2 % (ref 11.5–15.5)
WBC: 4.9 10*3/uL (ref 4.0–10.5)
nRBC: 0 % (ref 0.0–0.2)

## 2023-04-14 LAB — COMPREHENSIVE METABOLIC PANEL
ALT: 32 U/L (ref 0–44)
AST: 27 U/L (ref 15–41)
Albumin: 3.3 g/dL — ABNORMAL LOW (ref 3.5–5.0)
Alkaline Phosphatase: 59 U/L (ref 38–126)
Anion gap: 7 (ref 5–15)
BUN: 10 mg/dL (ref 8–23)
CO2: 25 mmol/L (ref 22–32)
Calcium: 9 mg/dL (ref 8.9–10.3)
Chloride: 105 mmol/L (ref 98–111)
Creatinine, Ser: 0.99 mg/dL (ref 0.61–1.24)
GFR, Estimated: >60 mL/min (ref >60)
Glucose, Bld: 113 mg/dL — ABNORMAL HIGH (ref 70–99)
Potassium: 3.4 mmol/L — ABNORMAL LOW (ref 3.5–5.1)
Sodium: 137 mmol/L (ref 135–145)
Total Bilirubin: 0.8 mg/dL (ref 0.3–1.2)
Total Protein: 6.1 g/dL — ABNORMAL LOW (ref 6.5–8.1)

## 2023-04-14 MED ORDER — METHOCARBAMOL 1000 MG/10ML IJ SOLN: 500.0000 mg | Freq: Four times a day (QID) | INTRAVENOUS | Status: DC | PRN

## 2023-04-14 MED ORDER — METOPROLOL SUCCINATE ER 25 MG PO TB24
25.0000 mg | ORAL_TABLET | Freq: Every day | ORAL | Status: DC
  Administered 2023-04-15 – 2023-04-20 (×6): 25 mg via ORAL
  Filled 2023-04-14 (×6): qty 1

## 2023-04-14 MED ORDER — DOCUSATE SODIUM 100 MG PO CAPS
100.0000 mg | ORAL_CAPSULE | Freq: Two times a day (BID) | ORAL | Status: DC
  Administered 2023-04-15 – 2023-04-20 (×10): 100 mg via ORAL
  Filled 2023-04-14 (×11): qty 1

## 2023-04-14 MED ORDER — OXYCODONE HCL 5 MG PO TABS
5.0000 mg | ORAL_TABLET | ORAL | Status: DC | PRN
  Administered 2023-04-14 – 2023-04-18 (×6): 10 mg via ORAL
  Filled 2023-04-14 (×6): qty 2

## 2023-04-14 MED ORDER — LOSARTAN POTASSIUM 50 MG PO TABS
25.0000 mg | ORAL_TABLET | Freq: Every day | ORAL | Status: DC
  Administered 2023-04-15 – 2023-04-20 (×6): 25 mg via ORAL
  Filled 2023-04-14 (×6): qty 1

## 2023-04-14 MED ORDER — SODIUM CHLORIDE 0.9 % IV SOLN
0.7500 ug/kg/min | INTRAVENOUS | Status: AC
  Administered 2023-04-14 – 2023-04-16 (×4): 0.75 ug/kg/min via INTRAVENOUS
  Filled 2023-04-14 (×5): qty 50

## 2023-04-14 MED ORDER — ATORVASTATIN CALCIUM 80 MG PO TABS
80.0000 mg | ORAL_TABLET | Freq: Every day | ORAL | Status: DC
  Administered 2023-04-15 – 2023-04-20 (×6): 80 mg via ORAL
  Filled 2023-04-14 (×6): qty 1

## 2023-04-14 MED ORDER — BISACODYL 5 MG PO TBEC: 5.0000 mg | DELAYED_RELEASE_TABLET | Freq: Every day | ORAL | Status: DC | PRN

## 2023-04-14 MED ORDER — ASPIRIN 81 MG PO CHEW
81.0000 mg | CHEWABLE_TABLET | Freq: Every day | ORAL | Status: DC
  Administered 2023-04-15 – 2023-04-17 (×3): 81 mg via ORAL
  Filled 2023-04-14 (×3): qty 1

## 2023-04-14 MED ORDER — POLYETHYLENE GLYCOL 3350 17 G PO PACK: 17.0000 g | PACK | Freq: Every day | ORAL | Status: DC | PRN

## 2023-04-14 MED ORDER — MORPHINE SULFATE (PF) 2 MG/ML IV SOLN: 2.0000 mg | INTRAVENOUS | Status: DC | PRN

## 2023-04-14 MED ORDER — TRAMADOL HCL 50 MG PO TABS
50.0000 mg | ORAL_TABLET | Freq: Three times a day (TID) | ORAL | Status: DC | PRN
  Administered 2023-04-16 (×2): 50 mg via ORAL
  Filled 2023-04-14 (×2): qty 1

## 2023-04-14 MED ORDER — ONDANSETRON HCL 4 MG/2ML IJ SOLN: 4.0000 mg | Freq: Four times a day (QID) | INTRAMUSCULAR | Status: DC | PRN

## 2023-04-14 MED ORDER — METHOCARBAMOL 500 MG PO TABS
500.0000 mg | ORAL_TABLET | Freq: Four times a day (QID) | ORAL | Status: DC | PRN
  Administered 2023-04-14 – 2023-04-16 (×3): 500 mg via ORAL
  Filled 2023-04-14 (×4): qty 1

## 2023-04-14 NOTE — Telephone Encounter (Signed)
Tresa Endo with [facility] called about clearance for the pt. I added Dr. Wyline Mood, Pre op APP and Tresa Endo (surgery sched) on to secure chat. Pre op APP asked to have call transferred to him at [phone]. I have transferred call to pre op APP to d/w surgery scheduler an update on the surgery. Per pre op APP he has an update from Dr. Shari Prows.

## 2023-04-14 NOTE — Consult Note (Addendum)
ORTHOPAEDIC CONSULTATION  REQUESTING PHYSICIAN: Jonah Blue, MD  Chief Complaint: Failed right hip resurfacing  HPI: Tyler James is a 62 y.o. male who had undergone bilateral hip resurfacing surgery was done at [facility] in 2009. First started developing pain in the right hip about 1 year ago.  He was referred to see me in clinic where x-rays showed varus failure of his femoral prosthesis.  At that point was still relatively immobile.  When referred to cardiology he was found to have a MI and underwent a cardiac cath.  He has been anticoagulated on Brilinta.  Unfortunately over the past couple of weeks has developed significant progression of his pain and immobility.  At this point he can barely get around on crutches.  New x-rays showed further progression of his femoral component failure.  He notes that the left side is still doing well.  Denies distal numbness and tingling.  Past Medical History:  Diagnosis Date   CAD (coronary artery disease)    a. 02/2016: STEMI - 100% stenosis OM2 w/ DES placed, POBA to LCx, and staged PCI of 95% stenosed RCA w/ DES.  Also in 11/2022   Hypertension    Ischemic cardiomyopathy    a. 02/2016: EF 45-50% w/ diffuse inferolateral hypokinesis   Past Surgical History:  Procedure Laterality Date   CARDIAC CATHETERIZATION N/A 03/10/2016   Procedure: Left Heart Cath and Coronary Angiography;  Surgeon: Peter M Swaziland, MD;  Location[facility] INVASIVE CV LAB;  Service: Cardiovascular;  Laterality: N/A;   CARDIAC CATHETERIZATION N/A 03/10/2016   Procedure: Coronary Stent Intervention;  Surgeon: Peter M Swaziland, MD;  Location: [facility] INVASIVE CV LAB;  Service: Cardiovascular;  Laterality: N/A;   CARDIAC CATHETERIZATION N/A 03/12/2016   Procedure: Coronary Stent Intervention;  Surgeon: Kathleene Hazel, MD;  Location: [facility] INVASIVE CV LAB;  Service: Cardiovascular;  Laterality: N/A;   CORONARY/GRAFT ACUTE MI REVASCULARIZATION N/A 11/26/2022   Procedure: Coronary/Graft Acute  MI Revascularization;  Surgeon: Marykay Lex, MD;  Location[facility] INVASIVE CV LAB;  Service: Cardiovascular;  Laterality: N/A;   LEFT HEART CATH AND CORONARY ANGIOGRAPHY N/A 11/26/2022   Procedure: LEFT HEART CATH AND CORONARY ANGIOGRAPHY;  Surgeon: Marykay Lex, MD;  Location: [facility] INVASIVE CV LAB;  Service: Cardiovascular;  Laterality: N/A;   TOTAL HIP ARTHROPLASTY     bilateral   Social History   Socioeconomic History   Marital status: Single    Spouse name: Not on file   Number of children: Not on file   Years of education: Not on file   Highest education level: Not on file  Occupational History   Occupation: disabled  Tobacco Use   Smoking status: Every Day    Packs/day: 1    Types: Cigarettes, Cigars   Smokeless tobacco: Never   Tobacco comments:    Has quit several times, currently smoking cigars  Vaping Use   Vaping Use: Never used  Substance and Sexual Activity   Alcohol use: Yes    Comment: beer frequently, 6+ periodically   Drug use: No   Sexual activity: Yes  Other Topics Concern   Not on file  Social History Narrative   Not on file   Social Determinants of Health   Financial Resource Strain: Not on file  Food Insecurity: No Food Insecurity (04/14/2023)   Hunger Vital Sign    Worried About Running Out of Food in the Last Year: Never true    Ran Out of Food in the Last Year: Never true  Transportation Needs: No Transportation Needs (04/14/2023)   PRAPARE - Administrator, Civil Service (Medical): No    Lack of Transportation (Non-Medical): No  Physical Activity: Not on file  Stress: Not on file  Social Connections: Not on file   History reviewed. No pertinent family history. Allergies  Allergen Reactions   Ibuprofen Swelling    Swelling of face     Positive ROS: All other systems have been reviewed and were otherwise negative with the exception of those mentioned in the HPI and as above.  Physical Exam: General: Alert, no acute  distress Cardiovascular: No pedal edema Respiratory: No cyanosis, no use of accessory musculature Skin: No lesions in the area of chief complaint Neurologic: Sensation intact distally Psychiatric: Patient is competent for consent with normal mood and affect  MUSCULOSKELETAL:  LLE No traumatic wounds, ecchymosis, or rash  Nontender  No groin pain with log roll  No knee or ankle effusion  Knee stable to varus/ valgus stress  Sens DPN, SPN, TN intact  Motor EHL, ext, flex 5/5  DP 2+, PT 2+, No significant edema RLE well-healed lateral hip incision  Severe pain with any hip manipulation  No knee or ankle effusion  Knee stable to varus/ valgus stress  Sens DPN, SPN, TN intact  Motor EHL, ext, flex 5/5  DP 2+, PT 2+, No significant edema   IMAGING: X-rays and  CT scan reviewed demonstrates varus femoral neck fracture chest distal to the hip resurfacing prosthesis  Assessment: 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  At this point given the degree of pain and disability I have concerns that his immobility will put him at risk for bedsores, pneumonia, blood clots, general weakness.  Ultimately think he would benefit from proceeding more urgently with conversion to total hip arthroplasty.  Given need to bridge his Brilinta per cardiology recommendations and myocardial infarction earlier this year patient has been direct admitted for preoperative optimization and management of his anticoagulation.  Will plan for surgery Friday afternoon pending medical clearance.    Please hold Brilinta until surgery if medically safe.  Would plan to restart this medication postop day 2.    [name], MD  Contact information:   [id number] 7am-5pm epic message Dr. Blanchie Dessert, or call office for patient follow up: [phone]  [phone] After hours and holidays please check [email or web address] for group call information for [facility]

## 2023-04-14 NOTE — Plan of Care (Signed)

## 2023-04-14 NOTE — TOC Initial Note (Signed)
Transition of Care [facility]) - Initial/Assessment Note    Patient Details  Name: Tyler James MRN: [id number] Date of Birth: 18-Jun-1961  Transition of Care [facility]) CM/SW Contact:    Gordy Clement, RN Phone Number: 04/14/2023, 2:36 PM  Clinical Narrative:         Transition of Care [facility]) - Inpatient Brief Assessment   Patient Details  Name: Tyler James MRN: [id number] Date of Birth: [birth year]  Transition of Care [facility]) CM/SW Contact:    Gordy Clement, RN Phone Number: 04/14/2023, 2:37 PM   Clinical Narrative:  Patient from home alone. Will be having surgery this Friday 5/31.  Concern for R hip periprosthetic fracture.  PT and OT will have to provide recs after surgery. Anticipate SNF vs Home    TOC will continue to follow patient for any additional discharge needs        Transition of Care Asessment: Insurance and Status: Insurance coverage has been reviewed Patient has primary care physician: Yes   Prior level of function:: Gait instability and minimally ambulatory Prior/Current Home Services: No current home services Social Determinants of Health Reivew: SDOH reviewed no interventions necessary Readmission risk has been reviewed: Yes (8%) Transition of care needs: transition of care needs identified, TOC will continue to follow (surgery Friday 5/29  Recs TBD  most likely HH vs SNF)                   Patient Goals and CMS Choice            Expected Discharge Plan and Services                                              Prior Living Arrangements/Services                       Activities of Daily Living Home Assistive Devices/Equipment: Crutches ADL Screening (condition at time of admission) Patient's cognitive ability adequate to safely complete daily activities?: Yes Is the patient deaf or have difficulty hearing?: No Does the patient have difficulty seeing, even when wearing glasses/contacts?: No Does the patient have difficulty  concentrating, remembering, or making decisions?: No Patient able to express need for assistance with ADLs?: Yes Does the patient have difficulty dressing or bathing?: No Independently performs ADLs?: Yes (appropriate for developmental age) Does the patient have difficulty walking or climbing stairs?: Yes Weakness of Legs: Right Weakness of Arms/Hands: None  Permission Sought/Granted                  Emotional Assessment              Admission diagnosis:  Periprosthetic hip fracture, sequela [E95.8XXS, Z96.649] Patient Active Problem List   Diagnosis Date Noted   Periprosthetic hip fracture, sequela 04/14/2023   CAD S/P percutaneous coronary angioplasty 04/14/2023   Hypertension 11/27/2022   Hyperlipidemia 11/27/2022   STEMI (ST elevation myocardial infarction) (HCC) 11/26/2022   Acute ST elevation myocardial infarction (STEMI) of inferolateral wall (HCC) 11/26/2022   Coronary artery disease involving native coronary artery of native heart with unstable angina pectoris (HCC) 11/26/2022   Unstable angina (HCC)    ST elevation (STEMI) myocardial infarction involving left circumflex coronary artery (HCC) 03/10/2016   Tobacco abuse 03/10/2016   PCP:  Benetta Spar, MD Pharmacy:   [facility] [phone] -  [place], [place] - [address] SEC [address] [place] [address] Phone: [phone] Fax: [phone]  [facility] [address] [place] [address] Phone: [phone] Fax: [phone]     Social Determinants of Health (SDOH) Social History: SDOH Screenings   Food Insecurity: No Food Insecurity (04/14/2023)  Housing: Low Risk  (04/14/2023)  Transportation Needs: No Transportation Needs (04/14/2023)  Utilities: Not At Risk (04/14/2023)  Tobacco Use: High Risk (04/14/2023)   SDOH Interventions:     Readmission Risk Interventions     No data to display

## 2023-04-14 NOTE — Consult Note (Addendum)
Cardiology Consultation   Patient ID: Tyler James MRN: [id number]; DOB: 06/04/1961  Admit date: 04/14/2023 Date of Consult: 04/14/2023  PCP:  Benetta Spar, MD   [facility] Providers Cardiologist:  Dina Rich, MD        Patient Profile:   Tyler James is a 62 y.o. male with a hx of CAD (s/p DES to LCX 2023, DES to OM2 and RCA in 2017), hyperlipidemia, hypertension, ischemic cardiomyopathy (now with LVEF recovered to 55%), tobacco use who is being seen 04/14/2023 for the evaluation of pre-operative clearance at the request of Dr. Ophelia Charter.  History of Present Illness:   Tyler James was admitted today after seeing Dr. Tonny Bollman in clinic following ED visit at [facility] on 5/18 for significant exacerbation of right hip pain. Today there was concern for periprosthetic fracture and decision made to direct admit for hip surgery on 5/31. Per patient, there has been significant progression of right hip pain over the past ~3 weeks, resulting in very limited mobility. Prior to 3 weeks ago, patient says that he was able to cut his grass and go up stairs without symptoms of chest pain or shortness of breath. He did have hip pain at that time that limited his exertion to some extent. He reports compliance with his cardiac regimen including Brilinta and ASA. This followed stent placement in the setting of inferior STEMI this past January. At that time, patient found with 75% stenosis of ostial LCX, 95% stenosis of proximal LCX. He also had known 85% stenosis of OM1 and 55% stenosis of jailed ostial AV groove, treated with PTCA. At the time of STEMI admission, plan was for 1 year of DAPT with likely continuation of Brilinta beyond one year given degree/nature of disease.  Tyler James has been doing very well since his STEMI and RCA PCI.  He has had no active cardiac symptoms of angina or heart failure and therefore no additional pre-op testing required since she just recently vascularized.     Past Medical History:  Diagnosis Date   CAD (coronary artery disease)    a. 02/2016: STEMI - 100% stenosis OM2 w/ DES placed, POBA to LCx, and staged PCI of 95% stenosed RCA w/ DES.  Also in 11/2022   Hypertension    Ischemic cardiomyopathy    a. 02/2016: EF 45-50% w/ diffuse inferolateral hypokinesis    Past Surgical History:  Procedure Laterality Date   CARDIAC CATHETERIZATION N/A 03/10/2016   Procedure: Left Heart Cath and Coronary Angiography;  Surgeon: Peter M Swaziland, MD;  Location: [facility] INVASIVE CV LAB;  Service: Cardiovascular;  Laterality: N/A;   CARDIAC CATHETERIZATION N/A 03/10/2016   Procedure: Coronary Stent Intervention;  Surgeon: Peter M Swaziland, MD;  Location: [facility] INVASIVE CV LAB;  Service: Cardiovascular;  Laterality: N/A;   CARDIAC CATHETERIZATION N/A 03/12/2016   Procedure: Coronary Stent Intervention;  Surgeon: Kathleene Hazel, MD;  Location: [facility] INVASIVE CV LAB;  Service: Cardiovascular;  Laterality: N/A;   CORONARY/GRAFT ACUTE MI REVASCULARIZATION N/A 11/26/2022   Procedure: Coronary/Graft Acute MI Revascularization;  Surgeon: Marykay Lex, MD;  Location: [facility] INVASIVE CV LAB;  Service: Cardiovascular;  Laterality: N/A;   LEFT HEART CATH AND CORONARY ANGIOGRAPHY N/A 11/26/2022   Procedure: LEFT HEART CATH AND CORONARY ANGIOGRAPHY;  Surgeon: Marykay Lex, MD;  Location: [facility] INVASIVE CV LAB;  Service: Cardiovascular;  Laterality: N/A;   TOTAL HIP ARTHROPLASTY     bilateral     Home Medications:  Prior to Admission medications  Medication Sig Start Date End Date Taking? Authorizing Provider  aspirin 81 MG chewable tablet Chew 1 tablet (81 mg total) by mouth daily. 03/13/16  Yes Strader, [place] M, PA-C  atorvastatin (LIPITOR) 80 MG tablet Take 1 tablet (80 mg total) by mouth daily. 11/28/22  Yes Arty Baumgartner, NP  diphenhydramine-acetaminophen (TYLENOL PM) 25-500 MG TABS tablet Take 1 tablet by mouth at bedtime as needed (Pain).   Yes [provider]  losartan (COZAAR) 25 MG tablet Take 25 mg by mouth daily.   Yes [provider]  metoprolol succinate (TOPROL XL) 25 MG 24 hr tablet Take 1 tablet (25 mg total) by mouth daily. 10/24/22  Yes Conte, Tessa N, PA-C  nitroGLYCERIN (NITROSTAT) 0.4 MG SL tablet Place 1 tablet (0.4 mg total) under the tongue every 5 (five) minutes as needed for chest pain. 12/04/22  Yes Sharlene Dory, NP  ticagrelor (BRILINTA) 90 MG TABS tablet Take 1 tablet (90 mg total) by mouth 2 (two) times daily. 02/24/23  Yes Branch, Dorothe Pea, MD  traMADol (ULTRAM) 50 MG tablet Take 50 mg by mouth every 8 (eight) hours as needed for moderate pain or severe pain. 04/09/23  Yes [provider]    Inpatient Medications: Scheduled Meds:  [START ON 04/15/2023] aspirin  81 mg Oral Daily   [START ON 04/15/2023] atorvastatin  80 mg Oral Daily   docusate sodium  100 mg Oral BID   [START ON 04/15/2023] losartan  25 mg Oral Daily   [START ON 04/15/2023] metoprolol succinate  25 mg Oral Daily   Continuous Infusions:  methocarbamol (ROBAXIN) IV     PRN Meds: bisacodyl, methocarbamol **OR** methocarbamol (ROBAXIN) IV, morphine injection, ondansetron (ZOFRAN) IV, oxyCODONE, polyethylene glycol, traMADol  Allergies:    Allergies  Allergen Reactions   Ibuprofen Swelling    Swelling of face    Social History:   Social History   Socioeconomic History   Marital status: Single    Spouse name: Not on file   Number of children: Not on file   Years of education: Not on file   Highest education level: Not on file  Occupational History   Occupation: disabled  Tobacco Use   Smoking status: Every Day    Packs/day: 1    Types: Cigarettes, Cigars   Smokeless tobacco: Never   Tobacco comments:    Has quit several times, currently smoking cigars  Vaping Use   Vaping Use: Never used  Substance and Sexual Activity   Alcohol use: Yes    Comment: beer frequently, 6+ periodically   Drug use: No   Sexual activity: Yes   Other Topics Concern   Not on file  Social History Narrative   Not on file   Social Determinants of Health   Financial Resource Strain: Not on file  Food Insecurity: No Food Insecurity (04/14/2023)   Hunger Vital Sign    Worried About Running Out of Food in the Last Year: Never true    Ran Out of Food in the Last Year: Never true  Transportation Needs: No Transportation Needs (04/14/2023)   PRAPARE - Administrator, Civil Service (Medical): No    Lack of Transportation (Non-Medical): No  Physical Activity: Not on file  Stress: Not on file  Social Connections: Not on file  Intimate Partner Violence: Not At Risk (04/14/2023)   Humiliation, Afraid, Rape, and Kick questionnaire    Fear of Current or Ex-Partner: No    Emotionally Abused: No  Physically Abused: No    Sexually Abused: No    Family History:   History reviewed. No pertinent family history.   ROS:  Please see the history of present illness.   All other ROS reviewed and negative.     Physical Exam/Data:   Vitals:   04/14/23 1137  BP: (!) 141/58  Pulse: (!) 59  Resp: 18  Temp: 97.8 F (36.6 C)  TempSrc: Oral  SpO2: 100%  Weight: 74.8 kg  Height: 5\' 9"  (1.753 m)   No intake or output data in the 24 hours ending 04/14/23 1355    04/14/2023   11:37 AM 04/04/2023   11:17 AM 12/04/2022    2:45 PM  Last 3 Weights  Weight (lbs) 165 lb 165 lb 171 lb 9.6 oz  Weight (kg) 74.844 kg 74.844 kg 77.837 kg     Body mass index is 24.37 kg/m.  General:  Well nourished, well developed, in no acute distress HEENT: normal Neck: no JVD Vascular: No carotid bruits; Distal pulses 2+ bilaterally Cardiac:  normal S1, S2; RRR; no murmur  Lungs:  clear to auscultation bilaterally, no wheezing, rhonchi or rales  Abd: soft, nontender, no hepatomegaly  Ext: no edema Musculoskeletal:  No deformities, BUE and BLE strength normal and equal Skin: warm and dry  Neuro:  CNs 2-12 intact, no focal abnormalities  noted Psych:  Normal affect   EKG:  The EKG was personally reviewed and demonstrates: sinus rhythm without acute ischemic changes. No ST abnormalities or T wave inversions  Telemetry:  Patient not on telemetry  Relevant CV Studies:  11/26/22 TTE: EF 55%.  Normal LV function.  Hypokinesis of the mid inferolateral and inferior wall.  Normal RV size and function with mildly elevated RV P and RAP..  Mild MR.  AOV sclerosis but no stenosis.  11/26/22 LHC-PCI for Inferolateral STEMI   POST-CATH DIAGNOSES:  Culprit Lesion: Proximal LCx 95% stenosis (irregular/ulcerated involving proximal stent edge) associated with ostial 75% stenosis Successful DES PCI covering both lesions overlapping the previous stent with Synergy DES 4.0 x 20 mm postdilated to 4.5 mm. Known 85% stenosis of OM1, and previous 55% stenosis of jailed ostial AV groove LCx treated with PTCA at time of original stent Widely patent RCA stent Mild diffuse disease in the LAD with calcification in the proximal segment with moderate stenosis just after heavily diseased main diagonal branch.   Mildly reduced LV function with likely inferolateral hypokinesis.  Post-Intervention    Antiplatelet/Anticoag    Recommend uninterrupted dual antiplatelet therapy with Aspirin 81mg  daily and Ticagrelor 90mg  twice daily for a minimum of 12 months (ACS-Class I recommendation). With now overlapping stents in the LCx and several moderate lesions, would recommend longer-term Thienopyridine CABG beyond 1 year to minimum of 2 years at maintenance dose.  (Decreasing Brilinta to 60 mg twice daily at that time.  Patient has pending hip surgery at the end of this month which would need to be rescheduled.  If hip surgery is urgent, would still like to wait at least a minimum of 3 months uninterrupted DAPT at which time 1 can consider an urgent surgery with bridging using either Aggrastat or cangrelor.     Laboratory Data:  High Sensitivity Troponin:  No  results for input(s): "TROPONINIHS" in the last 720 hours.   ChemistryNo results for input(s): "NA", "K", "CL", "CO2", "GLUCOSE", "BUN", "CREATININE", "CALCIUM", "MG", "GFRNONAA", "GFRAA", "ANIONGAP" in the last 168 hours.  No results for input(s): "PROT", "ALBUMIN", "AST", "ALT", "ALKPHOS", "BILITOT"  in the last 168 hours. Lipids No results for input(s): "CHOL", "TRIG", "HDL", "LABVLDL", "LDLCALC", "CHOLHDL" in the last 168 hours.  HematologyNo results for input(s): "WBC", "RBC", "HGB", "HCT", "MCV", "MCH", "MCHC", "RDW", "PLT" in the last 168 hours. Thyroid No results for input(s): "TSH", "FREET4" in the last 168 hours.  BNPNo results for input(s): "BNP", "PROBNP" in the last 168 hours.  DDimer No results for input(s): "DDIMER" in the last 168 hours.   Radiology/Studies:  No results found.   Assessment and Plan:   CAD Pre-operative clearance Hyperlipidemia  Patient with extensive CAD history, s/p DES to OM2 and RCA in 2017, LCX in 2024. Since intervention, patient has been without chest pain, exertional dyspnea. No use of nitroglycerin. Plan was for 1 year of DAPT with ASA and Brilinta 90mg  BID. Patient now admitted for right hip surgery, cardiology consulted for pre-operative clearance and bridging therapy while holding Brilinta.  Regarding surgical risk, the patient does not have any unstable cardiac conditions.  Upon evaluation today, he can achieve 4 METs or greater without anginal symptoms. His RCRI shows 6.6% perioperative risk of major cardiac events. According to [facility] and AHA guidelines, he requires no further cardiac workup prior to his noncardiac surgery and should be at acceptable risk. Regarding DAPT therapy, given significant stenting of LCX less than 6 months ago, would recommend anti-platelet bridging with Cangrelor. Heparin does not provide benefit in this situation. Patient received his last dose of Brilinta this morning and will initiate Cangrelor this evening. Hold prior to  surgery and resume Brilinta per surgical team, preferably POD1. Continue ASA 81mg , Lipitor 80mg , Toprol XL 25mg .   Hx ischemic cardiomyopathy -NYHA class I symptoms  Patient previously with reduced LVEF though noted to be normal at 55% on echo following PCI in the setting of STEMI January 2024. NYHA class I. Euvolemic on physical exam.   Hypertension  BP mildly elevated. Suspect this may be stress response to pain. Would continue home regimen of Losartan 25mg , Metoprolol 25mg  at this time  This scenario was thought of at the time of his MI and PCI.  Please see PCI note within the recommendations under the antiplatelet/anticoagulation section, I mentioned the pending hip surgery and would like to try to wait at least minimum 3 months of uninterrupted DAPT which time we can consider urgent surgery bridging with Aggrastat or cangrelor.  More appropriately would be with cangrelor to avoid excess bruising.  He had his Brilinta this morning, and will start cangrelor this evening - Cardiac Pharm-D assisting with management.  Heparin as an anticoagulant/antithrombotic agent does not bridge an antiplatelet agent.  Cangrelor can be held on the day of surgery preop and then the patient will be started on Brilinta once stable postop.  (Hopefully POD #1)  Otherwise seems to be stable Dr. Nanetta Batty standpoint no additional testing required.  No active angina or heart failure symptoms.  Is on stable regimen.  Acceptable risk to proceed with surgery based on lack of symptoms following recent revascularization.  Bryan Lemma, MD     Risk Assessment/Risk Scores:        New York Heart Association (NYHA) Functional Class NYHA Class I        For questions or updates, please contact [facility] Please consult [email or web address] for contact info under    Signed, Perlie Gold, PA-C  04/14/2023 1:55 PM   ATTENDING ATTESTATION  I have seen, examined and evaluated the patient this afternoon  along with Perlie Gold, PA.  After reviewing all the available data and chart, we discussed the patients laboratory, study & physical findings as well as symptoms in detail.  I agree with his findings, examination as well as impression recommendations as per our discussion.    Attending adjustments noted in italics.    Asymptomatic patient following PCI in setting STEMI.  As per Cath Report, patient to be admitted to allow holding of Brilinta (tachycardia lower) and bridging with cangrelor.  Will follow along, but no further active cardiology recommendations.  Cardiology from team will assist with managing and determining what the stop preoperatively.  Will then monitor restart Brilinta hopefully the night after surgery if not the next morning.    Marykay Lex, MD, MS Bryan Lemma, M.D., M.S. Interventional Cardiologist  [facility]  Pager # [phone] Phone # [phone] [address] [place], [place] [address]

## 2023-04-14 NOTE — Progress Notes (Addendum)
ANTIPLATELET CONSULT NOTE - Initial Consult  Pharmacy Consult for Brilinta >> Cangrelor Indication: hx DES  Allergies  Allergen Reactions   Ibuprofen Swelling    Swelling of face    Patient Measurements: Height: 5\' 9"  (175.3 cm) Weight: 74.8 kg (165 lb) IBW/kg (Calculated) : 70.7  Vital Signs: Temp: 98 F (36.7 C) (05/28 1422) Temp Source: Oral (05/28 1422) BP: 131/70 (05/28 1422) Pulse Rate: 58 (05/28 1422)  Labs: Recent Labs    04/14/23 1302  HGB 12.0*  HCT 37.5*  PLT 228    CrCl cannot be calculated (Patient's most recent lab result is older than the maximum 21 days allowed.).   Medications:  Scheduled:   [START ON 04/15/2023] aspirin  81 mg Oral Daily   [START ON 04/15/2023] atorvastatin  80 mg Oral Daily   docusate sodium  100 mg Oral BID   [START ON 04/15/2023] losartan  25 mg Oral Daily   [START ON 04/15/2023] metoprolol succinate  25 mg Oral Daily    Assessment: 62 yo male admitted from ortho clinic after follow up visit for R hip pain - concern for periprosthetic fracture. Direct admit for hip surgery on 5/31. On ASA and Brilinta since January 2024 after mult DES placed. Cardiology consulted for pre-op clearance - bridging Brilinta to Cangrelor given stents placed < 6 mo ago. Last dose of Brilinta 5/28 @ 0800.  Goal of Therapy:  Monitor platelets by anticoagulation protocol: Yes   Plan:  Begin cangrelor 0.75 mcg/kg/min (bridge dosing) this evening at 2000 (~12h post last Brilinta dose) F/u resume Brilinta post-op  Rexford Maus, PharmD, BCPS 04/14/2023 2:53 PM

## 2023-04-14 NOTE — H&P (Signed)
History and Physical    Patient: Tyler James:[id number] DOB: Nov 17, 1961 DOA: 04/14/2023 DOS: the patient was seen and examined on 04/14/2023 PCP: Tyler Spar, MD  Patient coming from: Home - lives with a friend, Tyler James; NOK: Tyler James, [phone]   Chief Complaint: Hip pain  HPI: Tyler James is a 62 y.o. male with medical history significant of CAD and ischemic cardiomyopathy presenting with progressively worsening R hip pain.   He reports that he had hip replacement in 2008 and 2010.  Things were fine and about 6 months ago his R hip started bothering him.  It got worse and worse.  He went to [facility] last week, pain was so bad he couldn't take it.  He had xrays and they gave him pain meds and told him to f/u with orthopedics.  He called for an appointment and had repeat xrays, hip is broken in the joint socket.  He is having trouble sleeping, getting on/off bed, has to use his arms to lift his leg.  He had an MI in January, had planned to have surgery on the hip previously but the MI delayed this plan.  No chest pain since January.  No SOB.    ER Course:  Direct admission from Dr. Tonny James:  Seen at [facility] on 5/18 for hip pain with some "new angulation" in the hardware.  He was advised to f/u with his orthopedist.   On evaluation with Dr. Tonny James today, there is concern for R hip periprosthetic fracture.  Chronically getting worse, significantly impairing ambulation, now bedbound.  He will need cardiology clearance given MI in January.  Will need bridge with heparin, hold Brilinta.  Surgery planned for Friday.      Review of Systems: As mentioned in the history of present illness. All other systems reviewed and are negative. Past Medical History:  Diagnosis Date   CAD (coronary artery disease)    a. 02/2016: STEMI - 100% stenosis OM2 w/ DES placed, POBA to LCx, and staged PCI of 95% stenosed RCA w/ DES.  Also in 11/2022   Hypertension    Ischemic  cardiomyopathy    a. 02/2016: EF 45-50% w/ diffuse inferolateral hypokinesis   Past Surgical History:  Procedure Laterality Date   CARDIAC CATHETERIZATION N/A 03/10/2016   Procedure: Left Heart Cath and Coronary Angiography;  Surgeon: Peter M Swaziland, MD;  Location: [facility] INVASIVE CV LAB;  Service: Cardiovascular;  Laterality: N/A;   CARDIAC CATHETERIZATION N/A 03/10/2016   Procedure: Coronary Stent Intervention;  Surgeon: Peter M Swaziland, MD;  Location: [facility] INVASIVE CV LAB;  Service: Cardiovascular;  Laterality: N/A;   CARDIAC CATHETERIZATION N/A 03/12/2016   Procedure: Coronary Stent Intervention;  Surgeon: Kathleene Hazel, MD;  Location: [facility] INVASIVE CV LAB;  Service: Cardiovascular;  Laterality: N/A;   CORONARY/GRAFT ACUTE MI REVASCULARIZATION N/A 11/26/2022   Procedure: Coronary/Graft Acute MI Revascularization;  Surgeon: Marykay Lex, MD;  Location: [facility] INVASIVE CV LAB;  Service: Cardiovascular;  Laterality: N/A;   LEFT HEART CATH AND CORONARY ANGIOGRAPHY N/A 11/26/2022   Procedure: LEFT HEART CATH AND CORONARY ANGIOGRAPHY;  Surgeon: Marykay Lex, MD;  Location: [facility] INVASIVE CV LAB;  Service: Cardiovascular;  Laterality: N/A;   TOTAL HIP ARTHROPLASTY     bilateral   Social History:  reports that he has been smoking cigarettes and cigars. He has been smoking an average of 1 pack per day. He has never used smokeless tobacco. He reports current alcohol use. He reports that he  does not use drugs.  Allergies  Allergen Reactions   Ibuprofen Swelling    Swelling of face    History reviewed. No pertinent family history.  Prior to Admission medications   Medication Sig Start Date End Date Taking? Authorizing Provider  aspirin 81 MG chewable tablet Chew 1 tablet (81 mg total) by mouth daily. 03/13/16   Strader, Lennart Pall, PA-C  atorvastatin (LIPITOR) 80 MG tablet Take 1 tablet (80 mg total) by mouth daily. 11/28/22   Arty Baumgartner, NP  losartan (COZAAR) 25 MG tablet Take 25 mg by  mouth daily.    [provider]  metoprolol succinate (TOPROL XL) 25 MG 24 hr tablet Take 1 tablet (25 mg total) by mouth daily. 10/24/22   Sharlene Dory, PA-C  Multiple Vitamin (MULTIVITAMIN) tablet Take 1 tablet by mouth daily.    [provider]  nitroGLYCERIN (NITROSTAT) 0.4 MG SL tablet Place 1 tablet (0.4 mg total) under the tongue every 5 (five) minutes as needed for chest pain. 12/04/22   Sharlene Dory, NP  ticagrelor (BRILINTA) 90 MG TABS tablet Take 1 tablet (90 mg total) by mouth 2 (two) times daily. 02/24/23   Antoine Poche, MD    Physical Exam: Vitals:   04/14/23 1137  BP: (!) 141/58  Pulse: (!) 59  Resp: 18  Temp: 97.8 F (36.6 C)  TempSrc: Oral  SpO2: 100%  Weight: 74.8 kg  Height: 5\' 9"  (1.753 m)   General:  Appears calm and comfortable and is in NAD, pain with movement of R hip Eyes:  EOMI, normal lids, iris ENT:  grossly normal hearing, lips & tongue, mmm; mostly absent dentition Neck:  no LAD, masses or thyromegaly Cardiovascular:  RRR, no m/r/g. No LE edema.  Respiratory:   CTA bilaterally with no wheezes/rales/rhonchi.  Normal respiratory effort. Abdomen:  soft, NT, ND Skin:  no rash or induration seen on limited exam Musculoskeletal:  R leg is significantly shorter than the L Psychiatric:  grossly normal mood and affect, speech fluent and appropriate, AOx3 Neurologic:  CN 2-12 grossly intact, moves all extremities in coordinated fashion   Radiological Exams on Admission: Independently reviewed - see discussion in A/P where applicable  No results found.  EKG: Independently reviewed.  NSR with rate 59; no evidence of acute ischemia   Labs on Admission: I have personally reviewed the available labs and imaging studies at the time of the admission.  Pertinent labs:    CBC and CMP are pending   Assessment and Plan: Principal Problem:   Periprosthetic hip fracture, sequela Active Problems:   Tobacco abuse   Hypertension    Hyperlipidemia   CAD S/P percutaneous coronary angioplasty    Peri-prosthetic R hip fracture -It appears that this has been present for some time, was previously planned for surgical repair but this was delayed by his MI in 11/2022 -He has had progressive gait instability and is currently minimally ambulatory -Based on recent CAD event, [facility] was asked to directly admit the patient to prepare for surgery on 5/31 -Will need cards clearance -Hold Brilinta, start Heparin for now -Orthopedics consulted -Pain control with Tylenol, Robaxin, Ultram, Oxycodone, and Morphine prn -TOC team consult for rehab placement vs. Home health -Will need PT consult post-operatively -Hip fracture order set utilized -TXA per orthopedics  CAD -Patient is on ASA and Brilinta -Continue ASA -Transition Brilinta to Heparin peri-operatively -No CP since event in January -Cardiac clearance requested from cardiology  HTN -Continue losartan, Toprol XL  HLD -Continue atorvastatin  Tobacco abuse -He is no longer smoking cigarettes but transitioned to cigars -Declines patch -Needs to stop smoking altogether    Advance Care Planning:   Code Status: Full Code   Consults: Orthopedics, cardiology; TOC team, nutrition; will need PT post-operatively  DVT Prophylaxis: Heparin infusion  Family Communication: None present; he declined to have me call family at the time of admission  Severity of Illness: The appropriate patient status for this patient is INPATIENT. Inpatient status is judged to be reasonable and necessary in order to provide the required intensity of service to ensure the patient's safety. The patient's presenting symptoms, physical exam findings, and initial radiographic and laboratory data in the context of their chronic comorbidities is felt to place them at high risk for further clinical deterioration. Furthermore, it is not anticipated that the patient will be medically stable for discharge from  the hospital within 2 midnights of admission.   * I certify that at the point of admission it is my clinical judgment that the patient will require inpatient hospital care spanning beyond 2 midnights from the point of admission due to high intensity of service, high risk for further deterioration and high frequency of surveillance required.*  Author: Jonah Blue, MD 04/14/2023 1:36 PM  For on call review [email or web address].

## 2023-04-15 DIAGNOSIS — Z96649 Presence of unspecified artificial hip joint: Secondary | ICD-10-CM

## 2023-04-15 DIAGNOSIS — I10 Essential (primary) hypertension: Secondary | ICD-10-CM

## 2023-04-15 DIAGNOSIS — Z720 Tobacco use: Secondary | ICD-10-CM

## 2023-04-15 DIAGNOSIS — E782 Mixed hyperlipidemia: Secondary | ICD-10-CM | POA: Diagnosis not present

## 2023-04-15 DIAGNOSIS — Z9861 Coronary angioplasty status: Secondary | ICD-10-CM

## 2023-04-15 DIAGNOSIS — M978XXS Periprosthetic fracture around other internal prosthetic joint, sequela: Secondary | ICD-10-CM | POA: Diagnosis not present

## 2023-04-15 DIAGNOSIS — I2510 Atherosclerotic heart disease of native coronary artery without angina pectoris: Secondary | ICD-10-CM

## 2023-04-15 DIAGNOSIS — I25810 Atherosclerosis of coronary artery bypass graft(s) without angina pectoris: Secondary | ICD-10-CM

## 2023-04-15 LAB — BASIC METABOLIC PANEL
Anion gap: 9 (ref 5–15)
BUN: 13 mg/dL (ref 8–23)
CO2: 22 mmol/L (ref 22–32)
Calcium: 8.7 mg/dL — ABNORMAL LOW (ref 8.9–10.3)
Chloride: 106 mmol/L (ref 98–111)
Creatinine, Ser: 0.98 mg/dL (ref 0.61–1.24)
GFR, Estimated: >60 mL/min (ref >60)
Glucose, Bld: 145 mg/dL — ABNORMAL HIGH (ref 70–99)
Potassium: 3.4 mmol/L — ABNORMAL LOW (ref 3.5–5.1)
Sodium: 137 mmol/L (ref 135–145)

## 2023-04-15 LAB — CBC
HCT: 39 % (ref 39.0–52.0)
Hemoglobin: 12.5 g/dL — ABNORMAL LOW (ref 13.0–17.0)
MCH: 24.6 pg — ABNORMAL LOW (ref 26.0–34.0)
MCHC: 32.1 g/dL (ref 30.0–36.0)
MCV: 76.8 fL — ABNORMAL LOW (ref 80.0–100.0)
Platelets: 227 10*3/uL (ref 150–400)
RBC: 5.08 MIL/uL (ref 4.22–5.81)
RDW: 14.2 % (ref 11.5–15.5)
WBC: 5.4 10*3/uL (ref 4.0–10.5)
nRBC: 0 % (ref 0.0–0.2)

## 2023-04-15 LAB — PHOSPHORUS: Phosphorus: 3.5 mg/dL (ref 2.5–4.6)

## 2023-04-15 LAB — MAGNESIUM: Magnesium: 1.9 mg/dL (ref 1.7–2.4)

## 2023-04-15 MED ORDER — ADULT MULTIVITAMIN W/MINERALS CH
1.0000 | ORAL_TABLET | Freq: Every day | ORAL | Status: DC
  Administered 2023-04-16 – 2023-04-20 (×5): 1 via ORAL
  Filled 2023-04-15 (×5): qty 1

## 2023-04-15 MED ORDER — POTASSIUM CHLORIDE CRYS ER 20 MEQ PO TBCR
40.0000 meq | EXTENDED_RELEASE_TABLET | Freq: Two times a day (BID) | ORAL | Status: AC
  Administered 2023-04-15 (×2): 40 meq via ORAL
  Filled 2023-04-15 (×2): qty 2

## 2023-04-15 MED ORDER — ACETAMINOPHEN 325 MG PO TABS: 650.0000 mg | ORAL_TABLET | Freq: Four times a day (QID) | ORAL | Status: DC | PRN

## 2023-04-15 NOTE — Progress Notes (Signed)
Initial Nutrition Assessment  DOCUMENTATION CODES:   Not applicable  INTERVENTION:  Liberalize diet to regular to remove restrictions and encourage adequate oral intake.  Provide double protein portions at meals.  Provide Magic cup TID with meals, each supplement provides 290 kcal and 9 grams of protein.   Provide multivitamin with minerals po daily.  NUTRITION DIAGNOSIS:   Increased nutrient needs related to hip fracture as evidenced by estimated needs.  GOAL:   Patient will meet greater than or equal to 90% of their needs  MONITOR:   PO intake, Supplement acceptance, Labs, Weight trends, I & O's  REASON FOR ASSESSMENT:   Consult Assessment of nutrition requirement/status, Hip fracture protocol  ASSESSMENT:   62 year old male with PMHx of CAD, ischemic cardiomyopathy, hx hip replacement in 2008 and 2010 who presented with progressively worsening right hip pain admitted with concern for right hip periprosthetic fracture.  Per chart plan to go to OR 5/31 for possible conversion to total hip arthroplasty.  Met with pt at bedside. He reports good appetite and intake now and PTA. He reports he lives at home with a friend. He eats 3 meals per day + 2-3 snacks between meals. For breakfast he typically has eggs with sausage or cereal. For lunch he has fish or lasagna. For dinner he has a sandwich with chips or meat with sides. Snacks are usually peanut butter crackers or chicken salad. Denies food allergies or intolerances. Denies nausea, emesis, or abdominal pain. Reports he has missing dentition but overall denies any difficulty with chewing/swallowing. He reports he just avoids regular peanuts as he cannot chew those well. Pt reports limited mobility lately with pain and has been essentially bed bound. Suspect this correlates with muscle depletions noted in lower extremities. Discussed increased nutrient needs. Pt reports he has tried ONS like Ensure in the past and didn't like it.  He reports he ate almost all of his breakfast (just didn't want the oatmeal). He reports his current diet is restrictive and he is not able to order adequate food, especially protein. Patient would benefit from diet liberalization to regular diet and double protein portions at meals. He is also amenable to trying Magic Cup supplements to help meet calorie/protein needs.  Pt reports his UBW is 165-170 lbs and he denies any weight loss. Suspect admission wt may have been reported and not truly measured. Unable to obtain a bed scale weight. Recommend measuring weight as medically feasible so this can be trended.  Medications reviewed and include: Colace 100 mg BID, potassium chloride 40 mEq BID, Robaxin  Labs reviewed: Potassium 3.4, Magnesium and phosphorus WNL  UOP: 400 mL UOP in previous 24 hours  I/O: -400 mL since admission  NUTRITION - FOCUSED PHYSICAL EXAM:  Flowsheet Row Most Recent Value  Orbital Region No depletion  Upper Arm Region Mild depletion  Thoracic and Lumbar Region No depletion  Buccal Region No depletion  Temple Region No depletion  Clavicle Bone Region No depletion  Clavicle and Acromion Bone Region No depletion  Scapular Bone Region No depletion  Dorsal Hand No depletion  Patellar Region Moderate depletion  Anterior Thigh Region Moderate depletion  Posterior Calf Region Moderate depletion  Edema (RD Assessment) None  Hair Reviewed  Eyes Reviewed  Mouth Reviewed  [missing dentition]  Skin Reviewed  Nails Reviewed       Diet Order:   Diet Order             Diet NPO time specified  Diet effective midnight           Diet Heart Fluid consistency: Thin  Diet effective now                  EDUCATION NEEDS:   No education needs have been identified at this time  Skin:  Skin Assessment: Reviewed RN Assessment  Last BM:  04/13/23 per chart  Height:   Ht Readings from Last 1 Encounters:  04/14/23 5\' 9"  (1.753 m)   Weight:   Wt Readings from  Last 1 Encounters:  04/14/23 74.8 kg   Ideal Body Weight:  72.7 kg  BMI:  Body mass index is 24.37 kg/m.  Estimated Nutritional Needs:   Kcal:  2000-2200  Protein:  100-110 grams  Fluid:  2-2.2 L/day  Letta Median, MS, RD, LDN, CNSC Pager number available on Amion

## 2023-04-15 NOTE — Progress Notes (Signed)
     Subjective: Doing well today. Pain well controlled at rest. Severe pain with right hip mobility. Eager to undergo surgery Friday. Questions regarding surgery answered.  Objective:   VITALS:   Vitals:   04/14/23 1422 04/14/23 2009 04/15/23 0623 04/15/23 1348  BP: 131/70 131/63 (!) 124/57 124/68  Pulse: (!) 58 62 60 (!) 56  Resp: 20 16 16 16   Temp: 98 F (36.7 C) 98.4 F (36.9 C) 97.8 F (36.6 C) 97.8 F (36.6 C)  TempSrc: Oral Oral  Oral  SpO2: 100% 99% 100% 100%  Weight:      Height:       Well healed right hip incision.  Sensation intact distally Intact pulses distally No cellulitis present Compartment soft No distal edema  Lab Results  Component Value Date   WBC 5.4 04/15/2023   HGB 12.5 (L) 04/15/2023   HCT 39.0 04/15/2023   MCV 76.8 (L) 04/15/2023   PLT 227 04/15/2023   BMET    Component Value Date/Time   NA 137 04/15/2023 0418   K 3.4 (L) 04/15/2023 0418   CL 106 04/15/2023 0418   CO2 22 04/15/2023 0418   GLUCOSE 145 (H) 04/15/2023 0418   BUN 13 04/15/2023 0418   CREATININE 0.98 04/15/2023 0418   CALCIUM 8.7 (L) 04/15/2023 0418   GFRNONAA >60 04/15/2023 0418     Assessment/Plan:    Principal Problem:   Periprosthetic hip fracture, sequela Active Problems:   Tobacco abuse   Hypertension   Hyperlipidemia   CAD S/P percutaneous coronary angioplasty  Right periprosthetic femur fracture around right hip resurfacing femoral component done in 2009.  Patient currently being bridged with cangrelor while we are holding his Brilinta in anticipation of surgery. Plan for holding medication preop per pharmacy note: (platelet function returns back to normal within 1 hour of stopping the medication- could consider stopping 2 hours prior)   Plan for surgery Friday early afternoon will be >72 hours from last dose dose of Brilinta.   The risks benefits and alternatives were discussed with the patient including but not limited to the risks of nonoperative  treatment, versus surgical intervention including infection, bleeding, stiffness, leg length discrepancy, nerve injury, the need for revision surgery, hardware prominence, hardware failure, the need for hardware removal, blood clots, cardiopulmonary complications, morbidity, mortality, among others, and they were willing to proceed.     Laquisha Northcraft A Leiana Rund 04/15/2023, 4:26 PM   Weber Cooks, MD  Contact information:   [phone] 7am-5pm epic message Dr. Blanchie Dessert, or call office for patient follow up: [phone] After hours and holidays please check [email or web address] for group call information for [facility]

## 2023-04-15 NOTE — Progress Notes (Signed)
ANTIPLATELET CONSULT NOTE  Pharmacy Consult for Brilinta >> Cangrelor Indication: hx DES  Allergies  Allergen Reactions   Ibuprofen Swelling    Swelling of face    Patient Measurements: Height: 5\' 9"  (175.3 cm) Weight: 74.8 kg (165 lb) IBW/kg (Calculated) : 70.7  Vital Signs: Temp: 97.8 F (36.6 C) (05/29 0623) BP: 124/57 (05/29 0623) Pulse Rate: 60 (05/29 0623)  Labs: Recent Labs    04/14/23 1302 04/15/23 0418  HGB 12.0* 12.5*  HCT 37.5* 39.0  PLT 228 227  CREATININE 0.99 0.98     Estimated Creatinine Clearance: 76.2 mL/min (by C-G formula based on SCr of 0.98 mg/dL).   Medications:  Scheduled:   aspirin  81 mg Oral Daily   atorvastatin  80 mg Oral Daily   docusate sodium  100 mg Oral BID   losartan  25 mg Oral Daily   metoprolol succinate  25 mg Oral Daily   potassium chloride  40 mEq Oral BID    Assessment: 62 yo male admitted from ortho clinic after follow up visit for R hip pain - concern for periprosthetic fracture. Direct admit for hip surgery on 5/31. On ASA and Brilinta since January 2024 after mult DES placed. Cardiology consulted for pre-op clearance - bridging Brilinta to Cangrelor given stents placed < 6 mo ago. Last dose of Brilinta 5/28 @ 0800.  CBC stable today. No s/sx of bleeding.   Goal of Therapy:  Monitor platelets by anticoagulation protocol: Yes   Plan:  Continue cangrelor 0.75 mcg/kg/min (bridge dosing) - f/u with ortho plan for stop with cangrelor (platelet function returns back to normal within 1 hour of stopping the medication- could consider stopping 2 hours prior) F/u resume Brilinta post-op  Thank you for allowing pharmacy to participate in this patient's care,  Sherron Monday, PharmD, BCCCP Clinical Pharmacist  Phone: [phone] 04/15/2023 11:22 AM  Please check AMION for all [facility] Pharmacy phone numbers After 10:00 PM, call Main Pharmacy [phone]

## 2023-04-15 NOTE — Progress Notes (Signed)
PROGRESS NOTE    Tyler James  ZOX:[id number] DOB: 1961-01-13 DOA: 04/14/2023 PCP: Benetta Spar, MD   Brief Narrative:  The patient is a 62 year old African-American male with a past medical history significant for but not limited to CAD status post stenting, ischemic cardiomyopathy, hypertension as well as other comorbidities who presented with progressively worsening right hip pain.  He reports that he had a hip replacement 2000 and 2010 and things were fine about 6 months ago but then he started developing right hip pain again which progressively got worse.  He went to [facility] last week given that the pain was a bad and x-rays were done and they gave him oral narcotics and told him to follow-up with orthopedics.  Call for an appointment and repeat x-rays in his hip was broken in the joint socket.  Given the pain he has been having trouble sleeping and recently had an MI in January and was planning to have the surgery of his hip but the MI delayed this plan.  He had no chest pain since January and he was a direct admission from orthopedic surgery given that he was seen and a fan on 04/04/2023 for hip pain with some "new angulation" in the hardware.  Given the concern for right hip periprosthetic fracture he was sent to the hospital and cardiology was consulted given his MI in January for clearance.  He was bridged and taken off of the Brilinta and placed on cangrelor.  Orthopedic surgery is planning for surgical intervention on Friday, 04/17/2023.   Assessment and Plan: No notes have been filed under this hospital service. Service: Hospitalist  Peri-prosthetic R hip fracture -It appears that this has been present for some time, was previously planned for surgical repair but this was delayed by his MI in 11/2022 -He has had progressive gait instability and is currently minimally ambulatory -Based on recent CAD event, [facility] was asked to directly admit the patient to prepare for surgery on  5/31 -Cardiology consulted for clearance and see below -Hold Brilinta and initially he was started on heparin drip but given that did not provide any benefit in his situation they have transitioned him to Cangrelor -Orthopedics consulted and planning surgical intervention given that the patient has been developing progressively worsening symptoms secondary to a skilled hip resurfacing procedure and given the degree of pain and disability they have concerns that his immobility will put him at risk for bedsores, pneumonia, blood clots as well as generalized weakness and ultimately they feel like he would benefit from proceeding with a total hip arthroplasty which will be done on Friday afternoon (04/17/23) -Pain control with benefit 650 g p.o. every 6hprn, Methacarbamol 500 mg po/IV q6hprn Muscle Spasms, Tramadol 50 mg po q8hprn Moderate Paine, Oxycodone 5-10 mg po q4hprn Breakthrough Pain, and IV Morphine 2 mg q2hprn Severe Pain -TOC team consult for Rehab Placement vs. Home health -Will need PT consult post-operatively -Hip fracture order set utilized -TXA per orthopedics -Continue bowel regimen and has been initiated on docusate 100 mg p.o. twice daily, MiraLAX 17 g daily as needed for mild constipation and if necessary will change to senna docusate 1 tab p.o. twice daily; patient also has a bisacodyl 5 mg p.o. daily as needed for mild constipation   CAD status post DES to OM2 and RCA in 2017 with left Circumflex in 2024 -Patient is on ASA and Brilinta prior to admission -Transitioned Brilinta to Cangrelor peri-operatively given that heparin provides no benefit in this  situation regarding his dual antiplatelet therapy -No CP since event in January -Cardiac clearance requested from Cardiology and regarding his surgical risk the patient does not have any unstable cardiac conditions and follow-up evaluation he can achieve 4 METS or greater without anginal symptoms and per cardiology his RCRI shows a 6.6%  perioperative risk of major cardiac events and requires no further cardiac workup prior to his noncardiac surgery and they feel that he should be an acceptable risk -Cardiology recommends continuing aspirin 81 mg p.o. daily, atorvastatin 80 mg p.o. daily, metoprolol succinate 25 mg p.o. daily and resuming Brilinta preferably postoperative day 1 after surgical intervention but orthopedic surgery recommending starting postoperative day 2   HTN -Continue Losartan 25 mg po Daily and Metoprolol Succinate 25 mg po Daily -Continue to Monitor BP per Protocol  -Last BP reading was 124/57   HLD -Continue Atorvastatin 80 mg po Daily   Hyperglycemia -Glucose Level Trend: Recent Labs  Lab 04/14/23 1302 04/15/23 0418  GLUCOSE 113* 145*  -Check HbA1c in the AM -Continue monitor CBGs per protocol and if necessary will place on sensitive NovoLog/scale insulin before meals and at bedtime   Tobacco Abuse -He is no longer smoking cigarettes but transitioned to cigars -Declines patch -Needs to stop smoking altogether will need continued smoking cessation counseling  Hypokalemia -Patient's K+ Level Trend: Recent Labs  Lab 04/14/23 1302 04/15/23 0418  K 3.4* 3.4*  -Replete with po Kcl 40 mEQ BID x2 -Check Mag Level in the AM  -Continue to Monitor and Replete as Necessary -Repeat CMP in the AM    Microcytic Anemia -Hgb/Hct Trend: Recent Labs  Lab 04/14/23 1302 04/15/23 0418  HGB 12.0* 12.5*  HCT 37.5* 39.0  MCV 77.0* 76.8*  -Check Anemia Panel in the AM -Continue to Monitor for S/Sx of Bleeding as patient is on Heparin gtt; No overt bleeding noted -Repeat CBC in the AM   Hypoalbuminemia -Patient's Albumin Trend: Recent Labs  Lab 04/14/23 1302  ALBUMIN 3.3*  -Continue to Monitor and Trend and repeat CMP in the AM   DVT prophylaxis: SCDs Start: 04/14/23 1317    Code Status: Full Code Family Communication: No family present at bedside  Disposition Plan:  Level of care:  Med-Surg Status is: Inpatient Remains inpatient appropriate because: Needs surgical intervention and plan is for surgery on Friday    Consultants:  Orthopedic Surgery Dr. Blanchie Dessert Cardiology Dr. Herbie Baltimore   Procedures:  As delineated as above  Antimicrobials:  Anti-infectives (From admission, onward)    None       Subjective: Seen and examined at bedside continues to have some right-sided hip pain especially when he moves.  No nausea or vomiting.  Denies lightheadedness or dizziness.  Feels okay but asking for percent pain medication for relief.  No other concerns or complaints at this time.  Objective: Vitals:   04/14/23 1137 04/14/23 1422 04/14/23 2009 04/15/23 0623  BP: (!) 141/58 131/70 131/63 (!) 124/57  Pulse: (!) 59 (!) 58 62 60  Resp: 18 20 16 16   Temp: 97.8 F (36.6 C) 98 F (36.7 C) 98.4 F (36.9 C) 97.8 F (36.6 C)  TempSrc: Oral Oral Oral   SpO2: 100% 100% 99% 100%  Weight: 74.8 kg     Height: 5\' 9"  (1.753 m)       Intake/Output Summary (Last 24 hours) at 04/15/2023 0830 Last data filed at 04/14/2023 2149 Gross per 24 hour  Intake --  Output 400 ml  Net -400 ml  Filed Weights   04/14/23 1137  Weight: 74.8 kg   Examination: Physical Exam:  Constitutional: WN/WD thin African-American male in some slight distress appears uncomfortable complains of right hip pain Respiratory: Diminished to auscultation bilaterally, no wheezing, rales, rhonchi or crackles. Normal respiratory effort and patient is not tachypenic. No accessory muscle use.  Unlabored breathing Cardiovascular: RRR, no murmurs / rubs / gallops. S1 and S2 auscultated. No extremity edema.  Abdomen: Soft, non-tender, non-distended. Bowel sounds positive.  GU: Deferred. Musculoskeletal: No clubbing / cyanosis of digits/nails. No joint deformity upper and lower extremities.  Skin: No rashes, lesions, ulcers on limited skin evaluation. No induration; Warm and dry.  Neurologic: CN 2-12 grossly  intact with no focal deficits. Romberg sign and cerebellar reflexes not assessed.  Psychiatric: Normal judgment and insight. Alert and oriented x 3. Normal mood and appropriate affect.   Data Reviewed: I have personally reviewed following labs and imaging studies  CBC: Recent Labs  Lab 04/14/23 1302 04/15/23 0418  WBC 4.9 5.4  NEUTROABS 3.2  --   HGB 12.0* 12.5*  HCT 37.5* 39.0  MCV 77.0* 76.8*  PLT 228 227   Basic Metabolic Panel: Recent Labs  Lab 04/14/23 1302 04/15/23 0418  NA 137 137  K 3.4* 3.4*  CL 105 106  CO2 25 22  GLUCOSE 113* 145*  BUN 10 13  CREATININE 0.99 0.98  CALCIUM 9.0 8.7*   GFR: Estimated Creatinine Clearance: 76.2 mL/min (by C-G formula based on SCr of 0.98 mg/dL). Liver Function Tests: Recent Labs  Lab 04/14/23 1302  AST 27  ALT 32  ALKPHOS 59  BILITOT 0.8  PROT 6.1*  ALBUMIN 3.3*   No results for input(s): "LIPASE", "AMYLASE" in the last 168 hours. No results for input(s): "AMMONIA" in the last 168 hours. Coagulation Profile: No results for input(s): "INR", "PROTIME" in the last 168 hours. Cardiac Enzymes: No results for input(s): "CKTOTAL", "CKMB", "CKMBINDEX", "TROPONINI" in the last 168 hours. BNP (last 3 results) No results for input(s): "PROBNP" in the last 8760 hours. HbA1C: No results for input(s): "HGBA1C" in the last 72 hours. CBG: No results for input(s): "GLUCAP" in the last 168 hours. Lipid Profile: No results for input(s): "CHOL", "HDL", "LDLCALC", "TRIG", "CHOLHDL", "LDLDIRECT" in the last 72 hours. Thyroid Function Tests: No results for input(s): "TSH", "T4TOTAL", "FREET4", "T3FREE", "THYROIDAB" in the last 72 hours. Anemia Panel: No results for input(s): "VITAMINB12", "FOLATE", "FERRITIN", "TIBC", "IRON", "RETICCTPCT" in the last 72 hours. Sepsis Labs: No results for input(s): "PROCALCITON", "LATICACIDVEN" in the last 168 hours.  No results found for this or any previous visit (from the past 240 hour(s)).    Radiology Studies: No results found.  Scheduled Meds:  aspirin  81 mg Oral Daily   atorvastatin  80 mg Oral Daily   docusate sodium  100 mg Oral BID   losartan  25 mg Oral Daily   metoprolol succinate  25 mg Oral Daily   Continuous Infusions:  cangrelor (KENGREAL) 50 mg in sodium chloride 0.9 % 250 mL (0.2 mg/mL) infusion 0.75 mcg/kg/min (04/14/23 2149)   methocarbamol (ROBAXIN) IV      LOS: 1 day   Marguerita Merles, DO [facility] Available via Epic secure chat 7am-7pm After these hours, please refer to coverage provider listed on [email or web address] 04/15/2023, 8:30 AM

## 2023-04-15 NOTE — Plan of Care (Signed)

## 2023-04-16 DIAGNOSIS — I10 Essential (primary) hypertension: Secondary | ICD-10-CM | POA: Diagnosis not present

## 2023-04-16 DIAGNOSIS — M978XXS Periprosthetic fracture around other internal prosthetic joint, sequela: Secondary | ICD-10-CM | POA: Diagnosis not present

## 2023-04-16 DIAGNOSIS — E782 Mixed hyperlipidemia: Secondary | ICD-10-CM | POA: Diagnosis not present

## 2023-04-16 DIAGNOSIS — Z96649 Presence of unspecified artificial hip joint: Secondary | ICD-10-CM

## 2023-04-16 DIAGNOSIS — I2510 Atherosclerotic heart disease of native coronary artery without angina pectoris: Secondary | ICD-10-CM

## 2023-04-16 DIAGNOSIS — Z9861 Coronary angioplasty status: Secondary | ICD-10-CM

## 2023-04-16 DIAGNOSIS — Z96641 Presence of right artificial hip joint: Secondary | ICD-10-CM

## 2023-04-16 DIAGNOSIS — Z720 Tobacco use: Secondary | ICD-10-CM

## 2023-04-16 LAB — CBC WITH DIFFERENTIAL/PLATELET
Abs Immature Granulocytes: 0.02 10*3/uL (ref 0.00–0.07)
Basophils Absolute: 0.1 10*3/uL (ref 0.0–0.1)
Basophils Relative: 1 %
Eosinophils Absolute: 0.2 10*3/uL (ref 0.0–0.5)
Eosinophils Relative: 4 %
HCT: 39.9 % (ref 39.0–52.0)
Hemoglobin: 12.6 g/dL — ABNORMAL LOW (ref 13.0–17.0)
Immature Granulocytes: 0 %
Lymphocytes Relative: 34 %
Lymphs Abs: 1.7 10*3/uL (ref 0.7–4.0)
MCH: 24 pg — ABNORMAL LOW (ref 26.0–34.0)
MCHC: 31.6 g/dL (ref 30.0–36.0)
MCV: 76.1 fL — ABNORMAL LOW (ref 80.0–100.0)
Monocytes Absolute: 0.5 10*3/uL (ref 0.1–1.0)
Monocytes Relative: 11 %
Neutro Abs: 2.5 10*3/uL (ref 1.7–7.7)
Neutrophils Relative %: 50 %
Platelets: 211 10*3/uL (ref 150–400)
RBC: 5.24 MIL/uL (ref 4.22–5.81)
RDW: 14.4 % (ref 11.5–15.5)
WBC: 5.1 10*3/uL (ref 4.0–10.5)
nRBC: 0 % (ref 0.0–0.2)

## 2023-04-16 LAB — COMPREHENSIVE METABOLIC PANEL
ALT: 35 U/L (ref 0–44)
AST: 30 U/L (ref 15–41)
Albumin: 3.4 g/dL — ABNORMAL LOW (ref 3.5–5.0)
Alkaline Phosphatase: 62 U/L (ref 38–126)
Anion gap: 12 (ref 5–15)
BUN: 16 mg/dL (ref 8–23)
CO2: 23 mmol/L (ref 22–32)
Calcium: 9.3 mg/dL (ref 8.9–10.3)
Chloride: 106 mmol/L (ref 98–111)
Creatinine, Ser: 0.93 mg/dL (ref 0.61–1.24)
GFR, Estimated: >60 mL/min (ref >60)
Glucose, Bld: 101 mg/dL — ABNORMAL HIGH (ref 70–99)
Potassium: 4.3 mmol/L (ref 3.5–5.1)
Sodium: 141 mmol/L (ref 135–145)
Total Bilirubin: 0.5 mg/dL (ref 0.3–1.2)
Total Protein: 6 g/dL — ABNORMAL LOW (ref 6.5–8.1)

## 2023-04-16 LAB — IRON AND TIBC
Iron: 37 ug/dL — ABNORMAL LOW (ref 45–182)
Saturation Ratios: 12 % — ABNORMAL LOW (ref 17.9–39.5)
TIBC: 311 ug/dL (ref 250–450)
UIBC: 274 ug/dL

## 2023-04-16 LAB — RETICULOCYTES
Immature Retic Fract: 14.2 % (ref 2.3–15.9)
RBC.: 5.23 MIL/uL (ref 4.22–5.81)
Retic Count, Absolute: 70.6 10*3/uL (ref 19.0–186.0)
Retic Ct Pct: 1.4 % (ref 0.4–3.1)

## 2023-04-16 LAB — PHOSPHORUS: Phosphorus: 4.1 mg/dL (ref 2.5–4.6)

## 2023-04-16 LAB — FERRITIN: Ferritin: 73 ng/mL (ref 24–336)

## 2023-04-16 LAB — MAGNESIUM: Magnesium: 2.1 mg/dL (ref 1.7–2.4)

## 2023-04-16 LAB — VITAMIN B12: Vitamin B-12: 412 pg/mL (ref 180–914)

## 2023-04-16 LAB — FOLATE: Folate: 9.3 ng/mL (ref >5.9)

## 2023-04-16 NOTE — Progress Notes (Signed)
ANTIPLATELET CONSULT NOTE  Pharmacy Consult for Brilinta >> Cangrelor Indication: hx DES  Allergies  Allergen Reactions   Ibuprofen Swelling    Swelling of face    Patient Measurements: Height: 5\' 9"  (175.3 cm) Weight: 74.8 kg (165 lb) IBW/kg (Calculated) : 70.7  Vital Signs: Temp: 98.2 F (36.8 C) (05/30 0737) Temp Source: Oral (05/30 0737) BP: 126/63 (05/30 0737) Pulse Rate: 55 (05/30 0737)  Labs: Recent Labs    04/14/23 1302 04/15/23 0418 04/16/23 0226  HGB 12.0* 12.5* 12.6*  HCT 37.5* 39.0 39.9  PLT 228 227 211  CREATININE 0.99 0.98 0.93     Estimated Creatinine Clearance: 80.2 mL/min (by C-G formula based on SCr of 0.93 mg/dL).   Medications:  Scheduled:   aspirin  81 mg Oral Daily   atorvastatin  80 mg Oral Daily   docusate sodium  100 mg Oral BID   losartan  25 mg Oral Daily   metoprolol succinate  25 mg Oral Daily   multivitamin with minerals  1 tablet Oral Daily    Assessment: 62 yo male admitted from ortho clinic after follow up visit for R hip pain - concern for periprosthetic fracture. Direct admit for hip surgery on 5/31. On ASA and Brilinta since January 2024 after mult DES placed. Cardiology consulted for pre-op clearance - bridging Brilinta to Cangrelor given stents placed < 6 mo ago. Last dose of Brilinta 5/28 @ 0800.  CBC stable today - Hgb 12.6, plt 211. No s/sx of bleeding.   Goal of Therapy:  Monitor platelets by anticoagulation protocol: Yes   Plan:  Continue cangrelor 0.75 mcg/kg/min (bridge dosing) - stop date entered for 5/31@0830  (~2 hr prior to surgery, discussed with Ortho) F/u resume Brilinta post-op  Thank you for allowing pharmacy to participate in this patient's care,  Sherron Monday, PharmD, BCCCP Clinical Pharmacist  Phone: [phone] 04/16/2023 8:16 AM  Please check AMION for all [facility] Pharmacy phone numbers After 10:00 PM, call Main Pharmacy [phone]

## 2023-04-16 NOTE — H&P (View-Only) (Signed)
Went by to see patient this afternoon. Doing well overall. Pain well controlled when at rest.  Plan for OR tomorrow scheduled for 1100. Surgical plan for R hip again discussed with the patient. Brilinta held since 5/28AM. Bridge with Cangrelor.  Plan to stop Cangrelor 0830 on 5/31 in anticipation of 1100 surgery. Will resume Brilinta POD1 vs 2 depending on intra-op bleeding and hemostasis.   

## 2023-04-16 NOTE — Progress Notes (Signed)
PROGRESS NOTE    Tyler James  ZOX:[id number] DOB: 07-02-1961 DOA: 04/14/2023 PCP: Benetta Spar, MD   Brief Narrative:  The patient is a 62 year old African-American male with a past medical history significant for but not limited to CAD status post stenting, ischemic cardiomyopathy, hypertension as well as other comorbidities who presented with progressively worsening right hip pain.  He reports that he had a hip replacement 2000 and 2010 and things were fine about 6 months ago but then he started developing right hip pain again which progressively got worse.  He went to [facility] last week given that the pain was a bad and x-rays were done and they gave him oral narcotics and told him to follow-up with orthopedics.  Call for an appointment and repeat x-rays in his hip was broken in the joint socket.  Given the pain he has been having trouble sleeping and recently had an MI in January and was planning to have the surgery of his hip but the MI delayed this plan.  He had no chest pain since January and he was a direct admission from orthopedic surgery given that he was seen and a fan on 04/04/2023 for hip pain with some "new angulation" in the hardware.  Given the concern for right hip periprosthetic fracture he was sent to the hospital and cardiology was consulted given his MI in January for clearance.  He was bridged and taken off of the Brilinta and placed on Cangrelor and continues to be on it.  Orthopedic surgery is planning for surgical intervention on Friday, 04/17/2023.     Assessment and Plan:   Peri-prosthetic R hip fracture -It appears that this has been present for some time, was previously planned for surgical repair but this was delayed by his MI in 11/2022 -He has had progressive gait instability and is currently minimally ambulatory -Based on recent CAD event, TRH was asked to directly admit the patient to prepare for surgery on 5/31 -Cardiology consulted for clearance and see  below -Hold Brilinta and initially he was started on heparin drip but given that did not provide any benefit in his situation they have transitioned him to Cangrelor 0.75 mcg/kg/min (bridge dosing); Plan is to stop the Cangrelor on 04/16/2020 at 8:30 AM approximately 2 hours prior to his surgery -Orthopedics consulted and planning surgical intervention given that the patient has been developing progressively worsening symptoms secondary to a skilled hip resurfacing procedure and given the degree of pain and disability they have concerns that his immobility will put him at risk for bedsores, pneumonia, blood clots as well as generalized weakness and ultimately they feel like he would benefit from proceeding with a total hip arthroplasty which will be done on Friday afternoon (04/17/23) -Pain control with benefit 650 g p.o. every 6hprn, Methacarbamol 500 mg po/IV q6hprn Muscle Spasms, Tramadol 50 mg po q8hprn Moderate Paine, Oxycodone 5-10 mg po q4hprn Breakthrough Pain, and IV Morphine 2 mg q2hprn Severe Pain -TOC team consult for Rehab Placement vs. Home health -Will need PT consult post-operatively -Hip fracture order set utilized -TXA per orthopedics -Continue bowel regimen and has been initiated on docusate 100 mg p.o. twice daily, MiraLAX 17 g daily as needed for mild constipation and if necessary will change to senna docusate 1 tab p.o. twice daily; patient also has a bisacodyl 5 mg p.o. daily as needed for mild constipation   CAD status post DES to OM2 and RCA in 2017 with left Circumflex in 2024 -Patient is  on ASA and Brilinta prior to admission -Transitioned Brilinta to Cangrelor peri-operatively given that heparin provides no benefit in this situation regarding his dual antiplatelet therapy -No CP since event in January -Cardiac clearance requested from Cardiology and regarding his surgical risk the patient does not have any unstable cardiac conditions and follow-up evaluation he can achieve 4  METS or greater without anginal symptoms and per cardiology his RCRI shows a 6.6% perioperative risk of major cardiac events and requires no further cardiac workup prior to his noncardiac surgery and they feel that he should be an acceptable risk -Cardiology recommends continuing aspirin 81 mg p.o. daily, atorvastatin 80 mg p.o. daily, metoprolol succinate 25 mg p.o. daily and resuming Brilinta preferably postoperative day 1 after surgical intervention but orthopedic surgery recommending starting postoperative day 2   HTN -Continue Losartan 25 mg po Daily and Metoprolol Succinate 25 mg po Daily -Continue to Monitor BP per Protocol  -Last BP reading was 126/63   HLD -Continue Atorvastatin 80 mg po Daily    Hyperglycemia -Glucose Level Trend: Recent Labs  Lab 04/14/23 1302 04/15/23 0418 04/16/23 0226  GLUCOSE 113* 145* 101*  -Check HbA1c in the AM -Continue monitor CBGs per protocol and if necessary will place on sensitive NovoLog/scale insulin before meals and at bedtime   Tobacco Abuse -He is no longer smoking cigarettes but transitioned to cigars -Declines patch -Needs to stop smoking altogether will need continued smoking cessation counseling   Hypokalemia -Patient's K+ Level Trend: Recent Labs  Lab 04/14/23 1302 04/15/23 0418 04/16/23 0226  K 3.4* 3.4* 4.3  -Replete with po Kcl 40 mEQ BID x2 yesterday -Checked Mag Level and is now 2.1 -Continue to Monitor and Replete as Necessary -Repeat CMP in the AM     Microcytic Anemia -Hgb/Hct Trend: Recent Labs  Lab 04/14/23 1302 04/15/23 0418 04/16/23 0226  HGB 12.0* 12.5* 12.6*  HCT 37.5* 39.0 39.9  MCV 77.0* 76.8* 76.1*  -Checked Anemia Panel and it showed an iron level of 37, UIBC 274, TIBC 311, saturation ratios of 12%, ferritin level of 73, folate level 9.3, vitamin B12 412 -Continue to Monitor for S/Sx of Bleeding as patient is on Heparin gtt; No overt bleeding noted -Repeat CBC in the AM     Hypoalbuminemia -Patient's Albumin Trend: Recent Labs  Lab 04/14/23 1302 04/16/23 0226  ALBUMIN 3.3* 3.4*  -Continue to Monitor and Trend and repeat CMP in the AM   DVT prophylaxis: SCDs Start: 04/14/23 1317    Code Status: Full Code Family Communication: No family present at bedside   Disposition Plan:  Level of care: Med-Surg Status is: Inpatient Remains inpatient appropriate because: Needs surgical intervention and plan is for surgery on Friday    Consultants:  Orthopedic Surgery Dr. Blanchie Dessert Cardiology Dr. Herbie [place]   Procedures:  As delineated as above  Antimicrobials:  Anti-infectives (From admission, onward)    None       Subjective: Seen and examined at bedside and states that he had an okay night and still felt uncomfortable.  States when ever he tries and moves he gets pain.  Currently his pain is 4 out of 10 in severity and asking for pain medication.  No nausea or vomiting.  Denies other concerns or complaints at this time.  Objective: Vitals:   04/15/23 1348 04/15/23 2121 04/16/23 0312 04/16/23 0737  BP: 124/68 (!) 144/75 125/71 126/63  Pulse: (!) 56 (!) 57 (!) 58 (!) 55  Resp: 16 18 19 16   Temp: 97.8 F (  36.6 C) 98 F (36.7 C) 98.3 F (36.8 C) 98.2 F (36.8 C)  TempSrc: Oral Oral Oral Oral  SpO2: 100% 100% 99% 100%  Weight:      Height:        Intake/Output Summary (Last 24 hours) at 04/16/2023 0841 Last data filed at 04/16/2023 4540 Gross per 24 hour  Intake 940.69 ml  Output --  Net 940.69 ml   Filed Weights   04/14/23 1137  Weight: 74.8 kg   Examination: Physical Exam:  Constitutional: American male in no acute distress appears calm but does appear little uncomfortable Respiratory: Diminished to auscultation bilaterally, no wheezing, rales, rhonchi or crackles. Normal respiratory effort and patient is not tachypenic. No accessory muscle use.  Unlabored breathing Cardiovascular: RRR, no murmurs / rubs / gallops. S1 and S2  auscultated. No extremity edema. Abdomen: Soft, non-tender, non-distended. Bowel sounds positive.  GU: Deferred. Musculoskeletal: No clubbing / cyanosis of digits/nails. No joint deformity upper and lower extremities. Skin: No rashes, lesions, ulcers on limited skin evaluation. No induration; Warm and dry.  Neurologic: CN 2-12 grossly intact with no focal deficits. Romberg sign and cerebellar reflexes not assessed.  Psychiatric: Normal judgment and insight. Alert and oriented x 3. Normal mood and appropriate affect.   Data Reviewed: I have personally reviewed following labs and imaging studies  CBC: Recent Labs  Lab 04/14/23 1302 04/15/23 0418 04/16/23 0226  WBC 4.9 5.4 5.1  NEUTROABS 3.2  --  2.5  HGB 12.0* 12.5* 12.6*  HCT 37.5* 39.0 39.9  MCV 77.0* 76.8* 76.1*  PLT 228 227 211   Basic Metabolic Panel: Recent Labs  Lab 04/14/23 1302 04/15/23 0418 04/16/23 0226  NA 137 137 141  K 3.4* 3.4* 4.3  CL 105 106 106  CO2 25 22 23   GLUCOSE 113* 145* 101*  BUN 10 13 16   CREATININE 0.99 0.98 0.93  CALCIUM 9.0 8.7* 9.3  MG  --  1.9 2.1  PHOS  --  3.5 4.1   GFR: Estimated Creatinine Clearance: 80.2 mL/min (by C-G formula based on SCr of 0.93 mg/dL). Liver Function Tests: Recent Labs  Lab 04/14/23 1302 04/16/23 0226  AST 27 30  ALT 32 35  ALKPHOS 59 62  BILITOT 0.8 0.5  PROT 6.1* 6.0*  ALBUMIN 3.3* 3.4*   No results for input(s): "LIPASE", "AMYLASE" in the last 168 hours. No results for input(s): "AMMONIA" in the last 168 hours. Coagulation Profile: No results for input(s): "INR", "PROTIME" in the last 168 hours. Cardiac Enzymes: No results for input(s): "CKTOTAL", "CKMB", "CKMBINDEX", "TROPONINI" in the last 168 hours. BNP (last 3 results) No results for input(s): "PROBNP" in the last 8760 hours. HbA1C: No results for input(s): "HGBA1C" in the last 72 hours. CBG: No results for input(s): "GLUCAP" in the last 168 hours. Lipid Profile: No results for input(s):  "CHOL", "HDL", "LDLCALC", "TRIG", "CHOLHDL", "LDLDIRECT" in the last 72 hours. Thyroid Function Tests: No results for input(s): "TSH", "T4TOTAL", "FREET4", "T3FREE", "THYROIDAB" in the last 72 hours. Anemia Panel: Recent Labs    04/16/23 0226  VITAMINB12 412  FOLATE 9.3  FERRITIN 73  TIBC 311  IRON 37*  RETICCTPCT 1.4   Sepsis Labs: No results for input(s): "PROCALCITON", "LATICACIDVEN" in the last 168 hours.  No results found for this or any previous visit (from the past 240 hour(s)).   Radiology Studies: No results found.  Scheduled Meds:  aspirin  81 mg Oral Daily   atorvastatin  80 mg Oral Daily   docusate  sodium  100 mg Oral BID   losartan  25 mg Oral Daily   metoprolol succinate  25 mg Oral Daily   multivitamin with minerals  1 tablet Oral Daily   Continuous Infusions:  cangrelor (KENGREAL) 50 mg in sodium chloride 0.9 % 250 mL (0.2 mg/mL) infusion 0.75 mcg/kg/min (04/16/23 0826)   methocarbamol (ROBAXIN) IV      LOS: 2 days   Marguerita Merles, DO [facility] Available via Epic secure chat 7am-7pm After these hours, please refer to coverage provider listed on [email or web address] 04/16/2023, 8:41 AM

## 2023-04-16 NOTE — Progress Notes (Signed)
Went by to see patient this afternoon. Doing well overall. Pain well controlled when at rest.  Plan for OR tomorrow scheduled for 1100. Surgical plan for R hip again discussed with the patient. Brilinta held since 5/28AM. Bridge with Cangrelor.  Plan to stop Cangrelor 0830 on 5/31 in anticipation of 1100 surgery. Will resume Brilinta POD1 vs 2 depending on intra-op bleeding and hemostasis.

## 2023-04-16 NOTE — Plan of Care (Signed)

## 2023-04-16 NOTE — Hospital Course (Addendum)
The patient is a 62 year old African-American male with a past medical history significant for but not limited to CAD status post stenting, ischemic cardiomyopathy, hypertension as well as other comorbidities who presented with progressively worsening right hip pain.  He reports that he had a hip replacement 2000 and 2010 and things were fine about 6 months ago but then he started developing right hip pain again which progressively got worse.  He went to [facility] last week given that the pain was a bad and x-rays were done and they gave him oral narcotics and told him to follow-up with orthopedics.  Call for an appointment and repeat x-rays in his hip was broken in the joint socket.  Given the pain he has been having trouble sleeping and recently had an MI in January and was planning to have the surgery of his hip but the MI delayed this plan.  He had no chest pain since January and he was a direct admission from orthopedic surgery given that he was seen and a fan on 04/04/2023 for hip pain with some "new angulation" in the hardware.  Given the concern for right hip periprosthetic fracture he was sent to the hospital and cardiology was consulted given his MI in January for clearance.  He was bridged and taken off of the Brilinta and placed on Cangrelor and continues to be on it.  Orthopedic surgery took the patient for surgical intervention on Friday, 04/17/2023 and is postoperative day 3.  PT recommending CIR and TOC assisting with Discharge Disposition but now recommendations have changed to Home Health PT/OT given his improvement.     Assessment and Plan:   Peri-prosthetic R hip fracture -It appears that this has been present for some time, was previously planned for surgical repair but this was delayed by his MI in 11/2022 -He has had progressive gait instability and is currently minimally ambulatory -Based on recent CAD event, TRH was asked to directly admit the patient to prepare for surgery on  5/31 -Cardiology consulted for clearance and see below -Cangrelor has been discontinued Brilinta to be started tomorrow -Orthopedics consulted and planning surgical intervention given that the patient has been developing progressively worsening symptoms secondary to a skilled hip resurfacing procedure and given the degree of pain and disability they have concerns that his immobility will put him at risk for bedsores, pneumonia, blood clots as well as generalized weakness and ultimately they feel like he would benefit from proceeding with a total hip arthroplasty which will be done on Friday afternoon (04/17/23) -Pain control with Scheduled Acetaminophen 650 g p.o. every 6h, Methacarbamol 500 mg 4x Daily Muscle Spasms; Continuing Tramadol 50 mg po q8hprn Moderate Pain, Oxycodone 5-10 mg po q6hprn Breakthrough Pain, and IV Morphine 2 mg q2hprn Severe Pain -TOC team consult for Rehab Placement  -Will need PT consult post-operatively and recommending CIR but now changed recommendation to Home Health -Hip fracture order set utilized -TXA per orthopedics -Continue bowel regimen and has been initiated on docusate 100 mg p.o. twice daily, MiraLAX 17 g daily as needed for mild constipation and if necessary will change to senna docusate 1 tab p.o. twice daily; patient also has a bisacodyl 5 mg p.o. daily as needed for mild constipation -Orthopedic surgery recommending weightbearing as tolerated on right leg with walker or crutches and assistance and recommending posterior hip precautions about 6 weeks and continuing with Prevena to remain in place until follow-up with Dr. Blanchie Dessert -Orthopedic surgery has also added antibiotics and is on  IV cefazolin 2 g every 8 hours for 3 days (Day 3/3) and recommending continuing cefadroxil 500 mg p.o. twice daily for 7 days at discharge for extended prophylaxis   CAD status post DES to OM2 and RCA in 2017 with left Circumflex in 2024 -Patient is on ASA and Brilinta prior to  admission -Transitioned Brilinta to Cangrelor peri-operatively given that heparin provides no benefit in this situation regarding his dual antiplatelet therapy -No CP since event in January -Cardiac clearance requested from Cardiology and regarding his surgical risk the patient does not have any unstable cardiac conditions and follow-up evaluation he can achieve 4 METS or greater without anginal symptoms and per cardiology his RCRI shows a 6.6% perioperative risk of major cardiac events and requires no further cardiac workup prior to his noncardiac surgery and they feel that he should be an acceptable risk -Cardiology recommends continuing aspirin 81 mg p.o. daily, atorvastatin 80 mg p.o. daily, metoprolol succinate 25 mg p.o. daily and now Ticagrelor is resumed at 90 mg po BID   HTN -Continue Losartan 25 mg po Daily and Metoprolol Succinate 25 mg po Daily -Continue to Monitor BP per Protocol  -Last BP reading was 120/63   HLD -Continue Atorvastatin 80 mg po Daily    Hyperglycemia in the setting of Pre-Diabetes -Glucose Level Trend: Recent Labs  Lab 04/14/23 1302 04/15/23 0418 04/16/23 0226 04/17/23 0314 04/18/23 0125 04/19/23 0154 04/20/23 0312  GLUCOSE 113* 145* 101* 88 137* 133* 98  -Check HbA1c in the AM; Last one checked was 5.9 -Continue monitor CBGs per protocol and if necessary will place on sensitive NovoLog/scale insulin before meals and at bedtime   Tobacco Abuse -He is no longer smoking cigarettes but transitioned to cigars -Declines patch -Needs to stop smoking altogether will need continued smoking cessation counseling   Hypokalemia -Patient's K+ Level Trend: Recent Labs  Lab 04/14/23 1302 04/15/23 0418 04/16/23 0226 04/17/23 0314 04/18/23 0125 04/19/23 0154 04/20/23 0312  K 3.4* 3.4* 4.3 3.9 4.0 3.8 4.4  -Continue to Monitor and Replete as Necessary -Repeat CMP in the AM     Microcytic Anemia -Hgb/Hct Trend: Recent Labs  Lab 04/14/23 1302  04/15/23 0418 04/16/23 0226 04/17/23 0314 04/18/23 0125 04/19/23 0154 04/20/23 0312  HGB 12.0* 12.5* 12.6* 12.8* 10.7* 10.0* 10.2*  HCT 37.5* 39.0 39.9 39.3 33.5* 30.6* 32.1*  MCV 77.0* 76.8* 76.1* 76.3* 76.7* 76.7* 76.2*  -Checked Anemia Panel and it showed an iron level of 37, UIBC 274, TIBC 311, saturation ratios of 12%, ferritin level of 73, folate level 9.3, vitamin B12 412 -Continue to Monitor for S/Sx of Bleeding as patient is is now back on ASA and Brilinita No overt bleeding noted -Repeat CBC in the AM    Hypoalbuminemia -Patient's Albumin Trend: Recent Labs  Lab 04/14/23 1302 04/16/23 0226 04/17/23 0314 04/18/23 0125 04/19/23 0154 04/20/23 0312  ALBUMIN 3.3* 3.4* 3.4* 3.3* 3.2* 3.1*  -Continue to Monitor and Trend and repeat CMP in the AM

## 2023-04-17 ENCOUNTER — Encounter (HOSPITAL_COMMUNITY)
Admission: AD | Disposition: A | Discharge status: Home Health | Payer: Self-pay | Source: Ambulatory Visit | Attending: Internal Medicine

## 2023-04-17 ENCOUNTER — Inpatient Hospital Stay (HOSPITAL_COMMUNITY): Payer: 59

## 2023-04-17 ENCOUNTER — Inpatient Hospital Stay (HOSPITAL_COMMUNITY): Payer: 59 | Admitting: Registered Nurse

## 2023-04-17 ENCOUNTER — Encounter (HOSPITAL_COMMUNITY): Payer: Self-pay | Admitting: Internal Medicine

## 2023-04-17 DIAGNOSIS — I2510 Atherosclerotic heart disease of native coronary artery without angina pectoris: Secondary | ICD-10-CM

## 2023-04-17 DIAGNOSIS — E782 Mixed hyperlipidemia: Secondary | ICD-10-CM | POA: Diagnosis not present

## 2023-04-17 DIAGNOSIS — F17210 Nicotine dependence, cigarettes, uncomplicated: Secondary | ICD-10-CM

## 2023-04-17 DIAGNOSIS — I10 Essential (primary) hypertension: Secondary | ICD-10-CM

## 2023-04-17 DIAGNOSIS — M9701XA Periprosthetic fracture around internal prosthetic right hip joint, initial encounter: Secondary | ICD-10-CM

## 2023-04-17 DIAGNOSIS — M978XXS Periprosthetic fracture around other internal prosthetic joint, sequela: Secondary | ICD-10-CM | POA: Diagnosis not present

## 2023-04-17 HISTORY — PX: CONVERSION TO TOTAL HIP: SHX5784

## 2023-04-17 LAB — SURGICAL PCR SCREEN
MRSA, PCR: NEGATIVE
Staphylococcus aureus: NEGATIVE

## 2023-04-17 LAB — CBC WITH DIFFERENTIAL/PLATELET
Abs Immature Granulocytes: 0.01 10*3/uL (ref 0.00–0.07)
Basophils Absolute: 0.1 10*3/uL (ref 0.0–0.1)
Basophils Relative: 1 %
Eosinophils Absolute: 0.2 10*3/uL (ref 0.0–0.5)
Eosinophils Relative: 4 %
HCT: 39.3 % (ref 39.0–52.0)
Hemoglobin: 12.8 g/dL — ABNORMAL LOW (ref 13.0–17.0)
Immature Granulocytes: 0 %
Lymphocytes Relative: 31 %
Lymphs Abs: 1.7 10*3/uL (ref 0.7–4.0)
MCH: 24.9 pg — ABNORMAL LOW (ref 26.0–34.0)
MCHC: 32.6 g/dL (ref 30.0–36.0)
MCV: 76.3 fL — ABNORMAL LOW (ref 80.0–100.0)
Monocytes Absolute: 0.6 10*3/uL (ref 0.1–1.0)
Monocytes Relative: 10 %
Neutro Abs: 2.9 10*3/uL (ref 1.7–7.7)
Neutrophils Relative %: 54 %
Platelets: 224 10*3/uL (ref 150–400)
RBC: 5.15 MIL/uL (ref 4.22–5.81)
RDW: 14.4 % (ref 11.5–15.5)
WBC: 5.4 10*3/uL (ref 4.0–10.5)
nRBC: 0 % (ref 0.0–0.2)

## 2023-04-17 LAB — COMPREHENSIVE METABOLIC PANEL
ALT: 34 U/L (ref 0–44)
AST: 32 U/L (ref 15–41)
Albumin: 3.4 g/dL — ABNORMAL LOW (ref 3.5–5.0)
Alkaline Phosphatase: 64 U/L (ref 38–126)
Anion gap: 10 (ref 5–15)
BUN: 16 mg/dL (ref 8–23)
CO2: 23 mmol/L (ref 22–32)
Calcium: 9 mg/dL (ref 8.9–10.3)
Chloride: 104 mmol/L (ref 98–111)
Creatinine, Ser: 0.95 mg/dL (ref 0.61–1.24)
GFR, Estimated: >60 mL/min (ref >60)
Glucose, Bld: 88 mg/dL (ref 70–99)
Potassium: 3.9 mmol/L (ref 3.5–5.1)
Sodium: 137 mmol/L (ref 135–145)
Total Bilirubin: 0.5 mg/dL (ref 0.3–1.2)
Total Protein: 6.3 g/dL — ABNORMAL LOW (ref 6.5–8.1)

## 2023-04-17 LAB — PHOSPHORUS: Phosphorus: 4.2 mg/dL (ref 2.5–4.6)

## 2023-04-17 LAB — MAGNESIUM: Magnesium: 2 mg/dL (ref 1.7–2.4)

## 2023-04-17 SURGERY — CONVERSION, PREVIOUS HIP SURGERY, TO TOTAL HIP ARTHROPLASTY
Anesthesia: General | Site: Hip | Laterality: Right

## 2023-04-17 MED ORDER — HYDROMORPHONE HCL 1 MG/ML IJ SOLN
INTRAMUSCULAR | Status: AC
  Filled 2023-04-17: qty 1

## 2023-04-17 MED ORDER — ACETAMINOPHEN 325 MG PO TABS: 325.0000 mg | ORAL_TABLET | Freq: Once | ORAL | Status: DC | PRN

## 2023-04-17 MED ORDER — POVIDONE-IODINE 10 % EX SWAB
2.0000 | Freq: Once | CUTANEOUS | Status: AC
  Administered 2023-04-17: 2 via TOPICAL

## 2023-04-17 MED ORDER — BUPIVACAINE-EPINEPHRINE (PF) 0.25% -1:200000 IJ SOLN
INTRAMUSCULAR | Status: AC
  Filled 2023-04-17: qty 30

## 2023-04-17 MED ORDER — ACETAMINOPHEN 500 MG PO TABS: 1000.0000 mg | ORAL_TABLET | Freq: Three times a day (TID) | ORAL | 0 refills | Status: AC | PRN

## 2023-04-17 MED ORDER — BUPIVACAINE LIPOSOME 1.3 % IJ SUSP
INTRAMUSCULAR | Status: AC
  Filled 2023-04-17: qty 20

## 2023-04-17 MED ORDER — MIDAZOLAM HCL 2 MG/2ML IJ SOLN
INTRAMUSCULAR | Status: AC
  Filled 2023-04-17: qty 2

## 2023-04-17 MED ORDER — ACETAMINOPHEN 160 MG/5ML PO SOLN: 325.0000 mg | Freq: Once | ORAL | Status: DC | PRN

## 2023-04-17 MED ORDER — SUGAMMADEX SODIUM 200 MG/2ML IV SOLN
INTRAVENOUS | Status: DC | PRN
  Administered 2023-04-17: 200 mg via INTRAVENOUS

## 2023-04-17 MED ORDER — ROCURONIUM BROMIDE 10 MG/ML (PF) SYRINGE
PREFILLED_SYRINGE | INTRAVENOUS | Status: DC | PRN
  Administered 2023-04-17: 60 mg via INTRAVENOUS
  Administered 2023-04-17 (×2): 20 mg via INTRAVENOUS

## 2023-04-17 MED ORDER — FENTANYL CITRATE (PF) 250 MCG/5ML IJ SOLN
INTRAMUSCULAR | Status: AC
  Filled 2023-04-17: qty 5

## 2023-04-17 MED ORDER — LACTATED RINGERS IV SOLN: INTRAVENOUS | Status: DC

## 2023-04-17 MED ORDER — SURGIRINSE WOUND IRRIGATION SYSTEM - OPTIME
TOPICAL | Status: DC | PRN
  Administered 2023-04-17: 450 mL

## 2023-04-17 MED ORDER — DEXAMETHASONE SODIUM PHOSPHATE 10 MG/ML IJ SOLN
INTRAMUSCULAR | Status: AC
  Filled 2023-04-17: qty 1

## 2023-04-17 MED ORDER — CHLORHEXIDINE GLUCONATE 4 % EX SOLN: 60.0000 mL | Freq: Once | CUTANEOUS | Status: DC

## 2023-04-17 MED ORDER — PROPOFOL 10 MG/ML IV BOLUS
INTRAVENOUS | Status: AC
  Filled 2023-04-17: qty 20

## 2023-04-17 MED ORDER — EPHEDRINE SULFATE-NACL 50-0.9 MG/10ML-% IV SOSY
PREFILLED_SYRINGE | INTRAVENOUS | Status: DC | PRN
  Administered 2023-04-17: 5 mg via INTRAVENOUS

## 2023-04-17 MED ORDER — CHLORHEXIDINE GLUCONATE 0.12 % MT SOLN: 15.0000 mL | Freq: Once | OROMUCOSAL | Status: AC

## 2023-04-17 MED ORDER — ONDANSETRON HCL 4 MG/2ML IJ SOLN
INTRAMUSCULAR | Status: AC
  Filled 2023-04-17: qty 2

## 2023-04-17 MED ORDER — MIDAZOLAM HCL 2 MG/2ML IJ SOLN
INTRAMUSCULAR | Status: DC | PRN
  Administered 2023-04-17: 1 mg via INTRAVENOUS

## 2023-04-17 MED ORDER — ONDANSETRON HCL 4 MG PO TABS: 4.0000 mg | ORAL_TABLET | Freq: Three times a day (TID) | ORAL | 0 refills | Status: AC | PRN

## 2023-04-17 MED ORDER — ROCURONIUM BROMIDE 10 MG/ML (PF) SYRINGE
PREFILLED_SYRINGE | INTRAVENOUS | Status: AC
  Filled 2023-04-17: qty 10

## 2023-04-17 MED ORDER — HYDROMORPHONE HCL 1 MG/ML IJ SOLN
0.2500 mg | INTRAMUSCULAR | Status: DC | PRN
  Administered 2023-04-17: 0.5 mg via INTRAVENOUS

## 2023-04-17 MED ORDER — PROMETHAZINE HCL 25 MG/ML IJ SOLN: 6.2500 mg | INTRAMUSCULAR | Status: DC | PRN

## 2023-04-17 MED ORDER — CHLORHEXIDINE GLUCONATE 0.12 % MT SOLN
OROMUCOSAL | Status: AC
  Administered 2023-04-17: 15 mL via OROMUCOSAL
  Filled 2023-04-17: qty 15

## 2023-04-17 MED ORDER — ACETAMINOPHEN 10 MG/ML IV SOLN: 1000.0000 mg | Freq: Once | INTRAVENOUS | Status: DC | PRN

## 2023-04-17 MED ORDER — AMISULPRIDE (ANTIEMETIC) 5 MG/2ML IV SOLN: 10.0000 mg | Freq: Once | INTRAVENOUS | Status: DC | PRN

## 2023-04-17 MED ORDER — METHOCARBAMOL 500 MG PO TABS: 500.0000 mg | ORAL_TABLET | Freq: Three times a day (TID) | ORAL | 0 refills | Status: AC | PRN

## 2023-04-17 MED ORDER — TICAGRELOR 90 MG PO TABS
90.0000 mg | ORAL_TABLET | Freq: Two times a day (BID) | ORAL | Status: DC
  Administered 2023-04-19 – 2023-04-20 (×3): 90 mg via ORAL
  Filled 2023-04-17 (×4): qty 1

## 2023-04-17 MED ORDER — ASPIRIN 81 MG PO TBEC
81.0000 mg | DELAYED_RELEASE_TABLET | Freq: Two times a day (BID) | ORAL | Status: AC
  Administered 2023-04-18 (×2): 81 mg via ORAL
  Filled 2023-04-17 (×2): qty 1

## 2023-04-17 MED ORDER — MEPERIDINE HCL 25 MG/ML IJ SOLN: 6.2500 mg | INTRAMUSCULAR | Status: DC | PRN

## 2023-04-17 MED ORDER — SODIUM CHLORIDE 0.9 % IR SOLN
Status: DC | PRN
  Administered 2023-04-17 (×2): 1000 mL

## 2023-04-17 MED ORDER — LIDOCAINE 2% (20 MG/ML) 5 ML SYRINGE
INTRAMUSCULAR | Status: DC | PRN
  Administered 2023-04-17: 40 mg via INTRAVENOUS

## 2023-04-17 MED ORDER — ORAL CARE MOUTH RINSE: 15.0000 mL | Freq: Once | OROMUCOSAL | Status: AC

## 2023-04-17 MED ORDER — 0.9 % SODIUM CHLORIDE (POUR BTL) OPTIME
TOPICAL | Status: DC | PRN
  Administered 2023-04-17: 1000 mL

## 2023-04-17 MED ORDER — LIDOCAINE 2% (20 MG/ML) 5 ML SYRINGE
INTRAMUSCULAR | Status: AC
  Filled 2023-04-17: qty 5

## 2023-04-17 MED ORDER — STERILE WATER FOR IRRIGATION IR SOLN
Status: DC | PRN
  Administered 2023-04-17: 1000 mL

## 2023-04-17 MED ORDER — ONDANSETRON HCL 4 MG/2ML IJ SOLN
INTRAMUSCULAR | Status: DC | PRN
  Administered 2023-04-17: 4 mg via INTRAVENOUS

## 2023-04-17 MED ORDER — OXYCODONE HCL 5 MG PO TABS: 5.0000 mg | ORAL_TABLET | ORAL | 0 refills | Status: AC | PRN

## 2023-04-17 MED ORDER — EPHEDRINE 5 MG/ML INJ
INTRAVENOUS | Status: AC
  Filled 2023-04-17: qty 5

## 2023-04-17 MED ORDER — CEFAZOLIN SODIUM-DEXTROSE 2-4 GM/100ML-% IV SOLN
2.0000 g | INTRAVENOUS | Status: AC
  Administered 2023-04-17: 2 g via INTRAVENOUS
  Filled 2023-04-17: qty 100

## 2023-04-17 MED ORDER — FENTANYL CITRATE (PF) 250 MCG/5ML IJ SOLN
INTRAMUSCULAR | Status: DC | PRN
  Administered 2023-04-17 (×3): 50 ug via INTRAVENOUS
  Administered 2023-04-17: 100 ug via INTRAVENOUS

## 2023-04-17 MED ORDER — DEXAMETHASONE SODIUM PHOSPHATE 10 MG/ML IJ SOLN
INTRAMUSCULAR | Status: DC | PRN
  Administered 2023-04-17: 5 mg via INTRAVENOUS

## 2023-04-17 MED ORDER — PROPOFOL 10 MG/ML IV BOLUS
INTRAVENOUS | Status: DC | PRN
  Administered 2023-04-17: 100 mg via INTRAVENOUS

## 2023-04-17 MED ORDER — BUPIVACAINE-EPINEPHRINE (PF) 0.25% -1:200000 IJ SOLN
INTRAMUSCULAR | Status: DC | PRN
  Administered 2023-04-17: 50 mL

## 2023-04-17 MED ORDER — PHENYLEPHRINE HCL-NACL 20-0.9 MG/250ML-% IV SOLN
INTRAVENOUS | Status: DC | PRN
  Administered 2023-04-17: 25 ug/min via INTRAVENOUS

## 2023-04-17 MED ORDER — CEFAZOLIN SODIUM-DEXTROSE 2-4 GM/100ML-% IV SOLN
2.0000 g | Freq: Three times a day (TID) | INTRAVENOUS | Status: AC
  Administered 2023-04-17 – 2023-04-20 (×9): 2 g via INTRAVENOUS
  Filled 2023-04-17 (×9): qty 100

## 2023-04-17 SURGICAL SUPPLY — 67 items
APL PRP STRL LF DISP 70% ISPRP (MISCELLANEOUS) ×1
BLADE SAGITTAL 25.0X1.27X90 (BLADE) ×1 IMPLANT
BLADE SAW RECIP 87.9 MT (BLADE) IMPLANT
BRUSH FEMORAL CANAL (MISCELLANEOUS) IMPLANT
BUR EGG ELITE 4.0 (BURR) IMPLANT
CANISTER WOUNDNEG PRESSURE 500 (CANNISTER) IMPLANT
CHLORAPREP W/TINT 26 (MISCELLANEOUS) ×2 IMPLANT
CNTNR URN SCR LID CUP LEK RST (MISCELLANEOUS) IMPLANT
CONT SPEC 4OZ STRL OR WHT (MISCELLANEOUS) ×1
COVER SURGICAL LIGHT HANDLE (MISCELLANEOUS) ×1 IMPLANT
DRAPE HALF SHEET 40X57 (DRAPES) ×1 IMPLANT
DRAPE HIP W/POCKET STRL (MISCELLANEOUS) ×1 IMPLANT
DRAPE INCISE IOBAN 85X60 (DRAPES) ×1 IMPLANT
DRAPE POUCH INSTRU U-SHP 10X18 (DRAPES) ×1 IMPLANT
DRAPE U-SHAPE 47X51 STRL (DRAPES) ×2 IMPLANT
DRESSING PEEL AND PLAC PRVNA20 (GAUZE/BANDAGES/DRESSINGS) IMPLANT
DRSG AQUACEL AG ADV 3.5X10 (GAUZE/BANDAGES/DRESSINGS) ×1 IMPLANT
DRSG PEEL AND PLACE PREVENA 20 (GAUZE/BANDAGES/DRESSINGS) ×1
ELECT BLADE 4.0 EZ CLEAN MEGAD (MISCELLANEOUS) ×1
ELECTRODE BLDE 4.0 EZ CLN MEGD (MISCELLANEOUS) ×1 IMPLANT
GLOVE BIOGEL PI IND STRL 8 (GLOVE) ×1 IMPLANT
GLOVE SRG 8 PF TXTR STRL LF DI (GLOVE) ×1 IMPLANT
GLOVE SURG ORTHO 8.0 STRL STRW (GLOVE) ×2 IMPLANT
GLOVE SURG UNDER POLY LF SZ8 (GLOVE) ×1
GOWN STRL REUS W/ TWL LRG LVL3 (GOWN DISPOSABLE) ×2 IMPLANT
GOWN STRL REUS W/ TWL XL LVL3 (GOWN DISPOSABLE) ×1 IMPLANT
GOWN STRL REUS W/TWL LRG LVL3 (GOWN DISPOSABLE) ×2
GOWN STRL REUS W/TWL XL LVL3 (GOWN DISPOSABLE) ×1
HANDPIECE INTERPULSE COAX TIP (DISPOSABLE) ×1
HEAD FEM CER V40 28 -4 (Head) IMPLANT
HOOD PEEL AWAY T7 (MISCELLANEOUS) ×3 IMPLANT
KIT BASIN OR (CUSTOM PROCEDURE TRAY) ×1 IMPLANT
KIT TURNOVER KIT A (KITS) ×1 IMPLANT
LINER REST ADM MDM 28X54X3 (Liner) IMPLANT
MANIFOLD NEPTUNE II (INSTRUMENTS) ×1 IMPLANT
MARKER SKIN DUAL TIP RULER LAB (MISCELLANEOUS) ×1 IMPLANT
NDL 18GX1X1/2 (RX/OR ONLY) (NEEDLE) ×1 IMPLANT
NEEDLE 18GX1X1/2 (RX/OR ONLY) (NEEDLE) ×1 IMPLANT
NS IRRIG 1000ML POUR BTL (IV SOLUTION) ×1 IMPLANT
PACK TOTAL JOINT (CUSTOM PROCEDURE TRAY) ×1 IMPLANT
PENCIL FOOT CONTROL (ELECTRODE) IMPLANT
PRESSURIZER FEMORAL UNIV (MISCELLANEOUS) IMPLANT
PREVENA INCISION MGT 90 150 (MISCELLANEOUS) IMPLANT
RETRIEVER SUT HEWSON (MISCELLANEOUS) ×1 IMPLANT
SEALER BIPOLAR AQUA 6.0 (INSTRUMENTS) ×1 IMPLANT
SET HNDPC FAN SPRY TIP SCT (DISPOSABLE) ×1 IMPLANT
SOLUTION IRRIG SURGIPHOR (IV SOLUTION) ×1 IMPLANT
STEM ACCOLADE SZ 6 (Hips) IMPLANT
SUCTION FRAZIER HANDLE 10FR (MISCELLANEOUS) ×1
SUCTION TUBE FRAZIER 10FR DISP (MISCELLANEOUS) ×1 IMPLANT
SUT BONE WAX W31G (SUTURE) ×1 IMPLANT
SUT ETHIBOND 2 V 37 (SUTURE) ×1 IMPLANT
SUT ETHILON 3 0 PS 1 (SUTURE) IMPLANT
SUT MNCRL AB 3-0 PS2 18 (SUTURE) ×1 IMPLANT
SUT STRATAFIX 1PDS 45CM VIOLET (SUTURE) ×2 IMPLANT
SUT VIC AB 0 CT1 27 (SUTURE) ×1
SUT VIC AB 0 CT1 27XBRD ANBCTR (SUTURE) ×1 IMPLANT
SUT VIC AB 1 CT1 18XCR BRD 8 (SUTURE) IMPLANT
SUT VIC AB 1 CT1 8-18 (SUTURE) ×1
SUT VIC AB 2-0 CT2 27 (SUTURE) ×2 IMPLANT
SYR 20ML LL LF (SYRINGE) ×1 IMPLANT
SYR 50ML LL SCALE MARK (SYRINGE) ×1 IMPLANT
TOWEL GREEN STERILE (TOWEL DISPOSABLE) ×1 IMPLANT
TOWER CARTRIDGE SMART MIX (DISPOSABLE) IMPLANT
TRAY FOLEY MTR SLVR 16FR STAT (SET/KITS/TRAYS/PACK) ×1 IMPLANT
TUBE SUCT ARGYLE STRL (TUBING) ×1 IMPLANT
WATER STERILE IRR 1000ML POUR (IV SOLUTION) ×1 IMPLANT

## 2023-04-17 NOTE — Anesthesia Preprocedure Evaluation (Addendum)
Anesthesia Evaluation  Patient identified by MRN, date of birth, ID band Patient awake    Reviewed: Allergy & Precautions, NPO status , Patient's Chart, lab work & pertinent test results  Airway Mallampati: I  TM Distance: >3 FB Neck ROM: Full    Dental  (+) Missing, Poor Dentition, Loose, Dental Advisory Given,    Pulmonary Current Smoker    + decreased breath sounds      Cardiovascular hypertension, Pt. on medications and Pt. on home beta blockers + CAD   Rhythm:Regular Rate:Normal  Echo: 1. Left ventricular ejection fraction, by estimation, is 55%. The left  ventricle has normal function. The left ventricle demonstrates regional  wall motion abnormalities (see scoring diagram/findings for description).  Left ventricular diastolic  parameters were normal.   2. Right ventricular systolic function is normal. The right ventricular  size is normal. There is normal pulmonary artery systolic pressure. The  estimated right ventricular systolic pressure is 30.7 mmHg.   3. The mitral valve is grossly normal. Mild mitral valve regurgitation.  No evidence of mitral stenosis.   4. The aortic valve is tricuspid. There is mild calcification of the  aortic valve. There is mild thickening of the aortic valve. Aortic valve  regurgitation is not visualized. No aortic stenosis is present.   5. The inferior vena cava is dilated in size with >50% respiratory  variability, suggesting right atrial pressure of 8 mmHg.     Neuro/Psych negative neurological ROS  negative psych ROS   GI/Hepatic negative GI ROS, Neg liver ROS,,,  Endo/Other  negative endocrine ROS    Renal/GU negative Renal ROS     Musculoskeletal negative musculoskeletal ROS (+)    Abdominal   Peds  Hematology negative hematology ROS (+)   Anesthesia Other Findings   Reproductive/Obstetrics                             Anesthesia  Physical Anesthesia Plan  ASA: 3  Anesthesia Plan: General   Post-op Pain Management: Tylenol PO (pre-op)*   Induction: Intravenous  PONV Risk Score and Plan: 2 and Ondansetron, Dexamethasone and Midazolam  Airway Management Planned: Oral ETT  Additional Equipment: None  Intra-op Plan:   Post-operative Plan: Extubation in OR  Informed Consent: I have reviewed the patients History and Physical, chart, labs and discussed the procedure including the risks, benefits and alternatives for the proposed anesthesia with the patient or authorized representative who has indicated his/her understanding and acceptance.       Plan Discussed with: CRNA  Anesthesia Plan Comments: (On Ticagrelor, unable to stop for 7 days. )       Anesthesia Quick Evaluation

## 2023-04-17 NOTE — Transfer of Care (Signed)
Immediate Anesthesia Transfer of Care Note  Patient: Tyler James  Procedure(s) Performed: CONVERSION HIP RESURFACING TO TOTAL HIP REPLACEMENT (Right: Hip)  Patient Location: PACU  Anesthesia Type:General  Level of Consciousness: awake, alert , and oriented  Airway & Oxygen Therapy: Patient Spontanous Breathing  Post-op Assessment: Report given to RN and Post -op Vital signs reviewed and stable  Post vital signs: Reviewed and stable  Last Vitals:  Vitals Value Taken Time  BP 161/60 04/17/23 1439  Temp 36.4 C 04/17/23 1439  Pulse 64 04/17/23 1444  Resp 21 04/17/23 1444  SpO2 99 % 04/17/23 1444  Vitals shown include unvalidated device data.  Last Pain:  Vitals:   04/17/23 1023  TempSrc: Oral  PainSc: 3          Complications: There were no known notable events for this encounter.

## 2023-04-17 NOTE — Care Management Important Message (Signed)
Important Message  Patient Details  Name: JAZMINE ZECH MRN: [id number] Date of Birth: 1961/10/12   Medicare Important Message Given:  Other (see comment)     Sherilyn Banker 04/17/2023, 3:13 PM

## 2023-04-17 NOTE — Op Note (Signed)
04/17/2023  2:28 PM  PATIENT:  Tyler James   MRN: [id number]  PRE-OPERATIVE DIAGNOSIS: Right hip resurfacing with femoral neck stress fracture  POST-OPERATIVE DIAGNOSIS:  same  PROCEDURE:  Procedure(s): Conversion right hip resurfacing to total hip arthroplasty  PREOPERATIVE INDICATIONS:    Tyler James is an 62 y.o. male who has a diagnosis of femoral neck periprosthetic stress fracture around the right hip resurfacing implant and elected for surgical management after failing conservative treatment.  We had initially planned to do surgery back in January but he had a STEMI at that time was treated by cardiology.  Unfortunately last couple of weeks his component has gone on to further subsidence and developing severe disabling pain to make him essentially bedbound.  Patient was indicated for conversion to total of arthroplasty.  He was admitted and bridged on cangrelor while his Brilinta was held.  The risks benefits and alternatives were discussed with the patient including but not limited to the risks of nonoperative treatment, versus surgical intervention including infection, bleeding, nerve injury, periprosthetic fracture, the need for revision surgery, dislocation, leg length discrepancy, blood clots, cardiopulmonary complications, morbidity, mortality, among others, and they were willing to proceed.     OPERATIVE REPORT     SURGEON:  Weber Cooks, MD    ASSISTANT: Kathie Dike, PA-C, (Present throughout the entire procedure,  necessary for completion of procedure in a timely manner, assisting with retraction, instrumentation, and closure)     ANESTHESIA: General  ESTIMATED BLOOD LOSS: 300cc    COMPLICATIONS:  None.     UNIQUE ASPECTS OF THE CASE: Significant shortening of the right hip approximately 2-1/2 cm.  Able to restore full neutral leg lengths.  Gross loosening of the fractured femoral head prosthesis.  COMPONENTS:  \\ St[employer] Accolade 2 with 127 degree neck  angle size #6, 48mm ADM/MDM X3 polyethylene dual mobility head, 28-4 mm ceramic head Implant Name Type Inv. Item Serial No. Manufacturer Lot No. LRB No. Used Action  STEM ACCOLADE SZ 6 - [id number] Hips STEM ACCOLADE SZ 6  [employer] [id number] Right 1 Implanted  HEAD FEM CER V40 28 -4 - [id number] Head HEAD FEM CER V40 28 -4  [employer] [id number] Right 1 Implanted  Restoration ADM/ MDM X3 Insert for ADM/MDM    [employer] [id number] Right 1 Implanted    The aquamantis was utilized for this case to help facilitate better hemostasis as patient was felt to be at increased risk of bleeding because of recent anticoagulation use and complex case requiring increased OR time and/or exposure.        PROCEDURE IN DETAIL:   The patient was met in the holding area and  identified.  The appropriate hip was identified and marked at the operative site.  The patient was then transported to the OR  and  placed under anesthesia.  At that point, the patient was  placed in the lateral decubitus position with the operative side up and  secured to the operating room table  and all bony prominences padded. A subaxillary role was also placed.    The operative lower extremity was prepped from the iliac crest to the distal leg.  Sterile draping was performed.  Preoperative antibiotics, 2 gm of ancef administered.  TXA not given because of the patient's cardiac history.. Time out was performed prior to incision.      A routine posterolateral approach was utilized via sharp dissection  carried down to the subcutaneous tissue utilizing the patient's old  scar but also extended the incision distally about 3 cm.Michaell Cowing bleeders were Bovie coagulated.  The iliotibial band was identified and incised along the length of the skin incision through the glute max fascia.  Charnley retractor was placed with care to protect the sciatic nerve posteriorly.  With the hip internally rotated, the old capsular repair was  released exposing the hip prosthesis.  The capsule femoral insertion and also tagged with a #2 Ethibond.  The GlutaminOx tendon had to be released distally.  Also the abductors were scarred proximally a Cobb retractor was taken along the iliac crest to release the abductors.  Once extensive releases were performed were able to carefully dislocate the hip.  The femoral component was severely shortened and in varus.  It was also notably loose.  We used a super crating saw to cut the superior femoral neck.  We then took a tamp and were able to easily extract the femoral prosthesis.  The femoral component was found to be confirmed to be 48 mm based on writing on the implant.  Also we took a caliper and compartment confirmed that the outer diameter of the femoral head was 48 mm.  We had a appropriate 48 mm dual mobility head ball to use with the existing acetabular component.  Also, there was intact good calcar bone which I felt was amenable to a primary prosthesis.  A formal neck cut was made based on preoperative templating.  As the acetabular prosthesis which was found to have no damage and intact without loosening.  It was also in a relatively good position of about 50 degrees of inclination and 20 degrees anteversion.  I then turned my attention back to the femur.  Because of severely sclerotic bone in the proximal femur due to the chronic stress reaction of his component failure a high-speed bur was used to open up the canal medially and laterally.     I then prepared the proximal femur using the box cutter, Charnley awl, and then sequentially broached starting with 0 up to a size 3.  I then carefully reduced the hip with a -4 ball.  Notably the hip was very tight.  But had good stability restoration of leg lengths.  Fluoroscopy was obtained which showed that the stem was in varus and there was plenty of room to work laterally.  I then took the high-speed bur again and burred laterally and was able to work  up to the size 6 stem.  Fluoroscopy confirmed appropriate fill of the femoral canal as well as restoration of length and offset.  A trial broach, neck, and head was utilized, and I reduced the hip and it was found to have excellent stability.  There was no impingement with full extension and 90 degrees external rotation.  The hip was stable at the position of sleep and with 90 degrees flexion and 70 degrees of internal rotation.  Leg lengths were also clinically assessed in the lateral position and felt to be equal. .  A final femoral prosthesis size 6 was selected. I then impacted the real femoral prosthesis into place.I again trialed and selected a 48mm -4mm ball. The hip was then reduced and taken through a range of motion. There was no impingement with full extension and 90 degrees external rotation.  The hip was stable at the position of sleep and with 90 degrees flexion and 70 degrees of internal rotation. Leg lengths were  again assessed and felt to be restored.  We  then opened, and I impacted the real head ball into place.  The posterior capsule was then closed with #2 Ethibond.    I then irrigated the hip copiously with dilute Betadine and with normal saline pulse lavage. Periarticular injection was then performed with 20cc Exparel and 30cc 0.25% marcaine with epi.   We repaired the fascia #1 barbed suture and interrupted #1 vicryl, followed by 0 barbed suture for the subcutaneous fat.  Skin was closed with 2-0 Vicryl and 3-0 Nylon.  Prevena wound VAC dressing were applied. The patient was then awakened and returned to PACU in stable and satisfactory condition.  Leg lengths in the supine position were assessed and felt to be clinically equal. There were no complications.  Post op recs: WB: WBAT RLE, 6 weeks posterior hip precautions Abx: ancef while in-house, follow-up IntraOp culture, cefadroxil 500 twice daily for 7 days post discharge for extended surgical prophylaxis Imaging: PACU pelvis  Xray Dressing: Incisional Prevena wound VAC, keep intact until follow up DVT prophylaxis: Aspirin 81BID starting POD1, then resume full dose Brilinta postop day 2 Follow up: 2 weeks after surgery for a wound check with Dr. Blanchie Dessert at [facility].  Address: [address], [place], [place] [address]  Office Phone: [phone]   Weber Cooks, MD Orthopedic Surgeon

## 2023-04-17 NOTE — Interval H&P Note (Signed)
The patient has been re-examined, and the chart reviewed, and there have been no interval changes to the documented history and physical.    Plan for conversion THA from failed resurfacing. Brilinta has been held since 5/28 AM. Cangrelor held since 0830 today. Hopeful to retain acetabular component. Plan for primary vs revision stem depending on proximal bone quality.  The operative side was examined and the patient was confirmed to have sensation to DPN, SPN, TN intact, Motor EHL, ext, flex 5/5, and DP 2+, PT 2+, No significant edema.   The risks, benefits, and alternatives have been discussed at length with patient, and the patient is willing to proceed.  Right hip marked. Consent has been signed.

## 2023-04-17 NOTE — Discharge Instructions (Signed)
INSTRUCTIONS AFTER JOINT REPLACEMENT   Remove items at home which could result in a fall. This includes throw rugs or furniture in walking pathways ICE to the affected joint every three hours while awake for 30 minutes at a time, for at least the first 3-5 days, and then as needed for pain and swelling.  Continue to use ice for pain and swelling. You may notice swelling that will progress down to the foot and ankle.  This is normal after surgery.  Elevate your leg when you are not up walking on it.   Continue to use the breathing machine you got in the hospital (incentive spirometer) which will help keep your temperature down.  It is common for your temperature to cycle up and down following surgery, especially at night when you are not up moving around and exerting yourself.  The breathing machine keeps your lungs expanded and your temperature down.   DIET:  As you were doing prior to hospitalization, we recommend a well-balanced diet.  DRESSING / WOUND CARE / SHOWERING  Keep the surgical dressing until follow up.  The dressing is water proof, so you can shower without any extra covering.  IF THE DRESSING FALLS OFF or the wound gets wet inside, change the dressing with sterile gauze.  Please use good hand washing techniques before changing the dressing.  Do not use any lotions or creams on the incision until instructed by your surgeon.    ACTIVITY  Increase activity slowly as tolerated, but follow the weight bearing instructions below.   No driving for 6 weeks or until further direction given by your physician.  You cannot drive while taking narcotics.  No lifting or carrying greater than 10 lbs. until further directed by your surgeon. Avoid periods of inactivity such as sitting longer than an hour when not asleep. This helps prevent blood clots.  You may return to work once you are authorized by your doctor.     WEIGHT BEARING   Weight bearing as tolerated with assist device (walker, cane,  etc) as directed, use it as long as suggested by your surgeon or therapist, typically at least 4-6 weeks.   EXERCISES  Results after joint replacement surgery are often greatly improved when you follow the exercise, range of motion and muscle strengthening exercises prescribed by your doctor. Safety measures are also important to protect the joint from further injury. Any time any of these exercises cause you to have increased pain or swelling, decrease what you are doing until you are comfortable again and then slowly increase them. If you have problems or questions, call your caregiver or physical therapist for advice.   Rehabilitation is important following a joint replacement. After just a few days of immobilization, the muscles of the leg can become weakened and shrink (atrophy).  These exercises are designed to build up the tone and strength of the thigh and leg muscles and to improve motion. Often times heat used for twenty to thirty minutes before working out will loosen up your tissues and help with improving the range of motion but do not use heat for the first two weeks following surgery (sometimes heat can increase post-operative swelling).   These exercises can be done on a training (exercise) mat, on the floor, on a table or on a bed. Use whatever works the best and is most comfortable for you.    Use music or television while you are exercising so that the exercises are a pleasant break in your  day. This will make your life better with the exercises acting as a break in your routine that you can look forward to.   Perform all exercises about fifteen times, three times per day or as directed.  You should exercise both the operative leg and the other leg as well.  Exercises include:   Quad Sets - Tighten up the muscle on the front of the thigh (Quad) and hold for 5-10 seconds.   Straight Leg Raises - With your knee straight (if you were given a brace, keep it on), lift the leg to 60  degrees, hold for 3 seconds, and slowly lower the leg.  Perform this exercise against resistance later as your leg gets stronger.  Leg Slides: Lying on your back, slowly slide your foot toward your buttocks, bending your knee up off the floor (only go as far as is comfortable). Then slowly slide your foot back down until your leg is flat on the floor again.  Angel Wings: Lying on your back spread your legs to the side as far apart as you can without causing discomfort.  Hamstring Strength:  Lying on your back, push your heel against the floor with your leg straight by tightening up the muscles of your buttocks.  Repeat, but this time bend your knee to a comfortable angle, and push your heel against the floor.  You may put a pillow under the heel to make it more comfortable if necessary.   A rehabilitation program following joint replacement surgery can speed recovery and prevent re-injury in the future due to weakened muscles. Contact your doctor or a physical therapist for more information on knee rehabilitation.    CONSTIPATION  Constipation is defined medically as fewer than three stools per week and severe constipation as less than one stool per week.  Even if you have a regular bowel pattern at home, your normal regimen is likely to be disrupted due to multiple reasons following surgery.  Combination of anesthesia, postoperative narcotics, change in appetite and fluid intake all can affect your bowels.   YOU MUST use at least one of the following options; they are listed in order of increasing strength to get the job done.  They are all available over the counter, and you may need to use some, POSSIBLY even all of these options:    Drink plenty of fluids (prune juice may be helpful) and high fiber foods Colace 100 mg by mouth twice a day  Senokot for constipation as directed and as needed Dulcolax (bisacodyl), take with full glass of water  Miralax (polyethylene glycol) once or twice a day as  needed.  If you have tried all these things and are unable to have a bowel movement in the first 3-4 days after surgery call either your surgeon or your primary doctor.    If you experience loose stools or diarrhea, hold the medications until you stool forms back up.  If your symptoms do not get better within 1 week or if they get worse, check with your doctor.  If you experience "the worst abdominal pain ever" or develop nausea or vomiting, please contact the office immediately for further recommendations for treatment.   ITCHING:  If you experience itching with your medications, try taking only a single pain pill, or even half a pain pill at a time.  You can also use Benadryl over the counter for itching or also to help with sleep.   TED HOSE STOCKINGS:  Use stockings on both  legs until for at least 2 weeks or as directed by physician office. They may be removed at night for sleeping.  MEDICATIONS:  See your medication summary on the "After Visit Summary" that nursing will review with you.  You may have some home medications which will be placed on hold until you complete the course of blood thinner medication.  It is important for you to complete the blood thinner medication as prescribed.   Blood clot prevention (DVT Prophylaxis): After surgery you are at an increased risk for a blood clot. It is important to resume your Brillinta after surgery to help reduce your risk of getting a blood clot. This will help prevent a blood clot. Signs of a pulmonary embolus (blood clot in the lungs) include sudden short of breath, feeling lightheaded or dizzy, chest pain with a deep breath, rapid pulse rapid breathing. Signs of a blood clot in your arms or legs include new unexplained swelling and cramping, warm, red or darkened skin around the painful area. Please call the office or 911 right away if these signs or symptoms develop.  PRECAUTIONS:  If you experience chest pain or shortness of breath - call 911  immediately for transfer to the hospital emergency department.   If you develop a fever greater that 101 F, purulent drainage from wound, increased redness or drainage from wound, foul odor from the wound/dressing, or calf pain - CONTACT YOUR SURGEON.                                                   FOLLOW-UP APPOINTMENTS:  If you do not already have a post-op appointment, please call the office for an appointment to be seen by your surgeon.  Guidelines for how soon to be seen are listed in your "After Visit Summary", but are typically between 2-3 weeks after surgery.   POST-OPERATIVE OPIOID TAPER INSTRUCTIONS: It is important to wean off of your opioid medication as soon as possible. If you do not need pain medication after your surgery it is ok to stop day one. Opioids include: Codeine, Hydrocodone(Norco, Vicodin), Oxycodone(Percocet, oxycontin) and hydromorphone amongst others.  Long term and even short term use of opiods can cause: Increased pain response Dependence Constipation Depression Respiratory depression And more.  Withdrawal symptoms can include Flu like symptoms Nausea, vomiting And more Techniques to manage these symptoms Hydrate well Eat regular healthy meals Stay active Use relaxation techniques(deep breathing, meditating, yoga) Do Not substitute Alcohol to help with tapering If you have been on opioids for less than two weeks and do not have pain than it is ok to stop all together.  Plan to wean off of opioids This plan should start within one week post op of your joint replacement. Maintain the same interval or time between taking each dose and first decrease the dose.  Cut the total daily intake of opioids by one tablet each day Next start to increase the time between doses. The last dose that should be eliminated is the evening dose.   MAKE SURE YOU:  Understand these instructions.  Get help right away if you are not doing well or get worse.    Thank you  for letting us be a part of your medical care team.  It is a privilege we respect greatly.  We hope these instructions will help you stay  on track for a fast and full recovery!

## 2023-04-17 NOTE — Progress Notes (Signed)
PROGRESS NOTE    Tyler James  ZOX:[id number] DOB: 1961/10/07 DOA: 04/14/2023 PCP: Benetta Spar, MD   Brief Narrative:  The patient is a 62 year old African-American male with a past medical history significant for but not limited to CAD status post stenting, ischemic cardiomyopathy, hypertension as well as other comorbidities who presented with progressively worsening right hip pain.  He reports that he had a hip replacement 2000 and 2010 and things were fine about 6 months ago but then he started developing right hip pain again which progressively got worse.  He went to [facility] last week given that the pain was a bad and x-rays were done and they gave him oral narcotics and told him to follow-up with orthopedics.  Call for an appointment and repeat x-rays in his hip was broken in the joint socket.  Given the pain he has been having trouble sleeping and recently had an MI in January and was planning to have the surgery of his hip but the MI delayed this plan.  He had no chest pain since January and he was a direct admission from orthopedic surgery given that he was seen and a fan on 04/04/2023 for hip pain with some "new angulation" in the hardware.  Given the concern for right hip periprosthetic fracture he was sent to the hospital and cardiology was consulted given his MI in January for clearance.  He was bridged and taken off of the Brilinta and placed on Cangrelor and continues to be on it.  Orthopedic surgery is planning for surgical intervention on Friday, 04/17/2023.     Assessment and Plan:   Peri-prosthetic R hip fracture -It appears that this has been present for some time, was previously planned for surgical repair but this was delayed by his MI in 11/2022 -He has had progressive gait instability and is currently minimally ambulatory -Based on recent CAD event, [facility] was asked to directly admit the patient to prepare for surgery on 5/31 -Cardiology consulted for clearance and see  below -Hold Brilinta and initially he was started on heparin drip but given that did not provide any benefit in his situation they have transitioned him to Cangrelor 0.75 mcg/kg/min (bridge dosing); Plan is to stop the Cangrelor on 04/16/2020 at 8:30 AM approximately 2 hours prior to his surgery -Orthopedics consulted and planning surgical intervention given that the patient has been developing progressively worsening symptoms secondary to a skilled hip resurfacing procedure and given the degree of pain and disability they have concerns that his immobility will put him at risk for bedsores, pneumonia, blood clots as well as generalized weakness and ultimately they feel like he would benefit from proceeding with a total hip arthroplasty which will be done on Friday afternoon (04/17/23) -Pain control with benefit 650 g p.o. every 6hprn, Methacarbamol 500 mg po/IV q6hprn Muscle Spasms, Tramadol 50 mg po q8hprn Moderate Paine, Oxycodone 5-10 mg po q4hprn Breakthrough Pain, and IV Morphine 2 mg q2hprn Severe Pain -TOC team consult for Rehab Placement vs. Home health -Will need PT consult post-operatively -Hip fracture order set utilized -TXA per orthopedics -Continue bowel regimen and has been initiated on docusate 100 mg p.o. twice daily, MiraLAX 17 g daily as needed for mild constipation and if necessary will change to senna docusate 1 tab p.o. twice daily; patient also has a bisacodyl 5 mg p.o. daily as needed for mild constipation   CAD status post DES to OM2 and RCA in 2017 with left Circumflex in 2024 -Patient is  on ASA and Brilinta prior to admission -Transitioned Brilinta to Cangrelor peri-operatively given that heparin provides no benefit in this situation regarding his dual antiplatelet therapy -No CP since event in January -Cardiac clearance requested from Cardiology and regarding his surgical risk the patient does not have any unstable cardiac conditions and follow-up evaluation he can achieve 4  METS or greater without anginal symptoms and per cardiology his RCRI shows a 6.6% perioperative risk of major cardiac events and requires no further cardiac workup prior to his noncardiac surgery and they feel that he should be an acceptable risk -Cardiology recommends continuing aspirin 81 mg p.o. daily, atorvastatin 80 mg p.o. daily, metoprolol succinate 25 mg p.o. daily and resuming Brilinta preferably postoperative day 1 after surgical intervention but orthopedic surgery recommending starting postoperative day 2   HTN -Continue Losartan 25 mg po Daily and Metoprolol Succinate 25 mg po Daily -Continue to Monitor BP per Protocol  -Last BP reading was 126/63   HLD -Continue Atorvastatin 80 mg po Daily    Hyperglycemia -Glucose Level Trend: Recent Labs  Lab 04/14/23 1302 04/15/23 0418 04/16/23 0226  GLUCOSE 113* 145* 101*  -Check HbA1c in the AM -Continue monitor CBGs per protocol and if necessary will place on sensitive NovoLog/scale insulin before meals and at bedtime   Tobacco Abuse -He is no longer smoking cigarettes but transitioned to cigars -Declines patch -Needs to stop smoking altogether will need continued smoking cessation counseling   Hypokalemia -Patient's K+ Level Trend: Recent Labs  Lab 04/14/23 1302 04/15/23 0418 04/16/23 0226  K 3.4* 3.4* 4.3  -Replete with po Kcl 40 mEQ BID x2 yesterday -Checked Mag Level and is now 2.1 -Continue to Monitor and Replete as Necessary -Repeat CMP in the AM     Microcytic Anemia -Hgb/Hct Trend: Recent Labs  Lab 04/14/23 1302 04/15/23 0418 04/16/23 0226  HGB 12.0* 12.5* 12.6*  HCT 37.5* 39.0 39.9  MCV 77.0* 76.8* 76.1*  -Checked Anemia Panel and it showed an iron level of 37, UIBC 274, TIBC 311, saturation ratios of 12%, ferritin level of 73, folate level 9.3, vitamin B12 412 -Continue to Monitor for S/Sx of Bleeding as patient is on Heparin gtt; No overt bleeding noted -Repeat CBC in the AM     Hypoalbuminemia -Patient's Albumin Trend: Recent Labs  Lab 04/14/23 1302 04/16/23 0226  ALBUMIN 3.3* 3.4*  -Continue to Monitor and Trend and repeat CMP in the AM   DVT prophylaxis: SCDs Start: 04/14/23 1317    Code Status: Full Code Family Communication: Family currently at bedside  Disposition Plan:  Level of care: Med-Surg Status is: Inpatient Remains inpatient appropriate because: Is getting surgical intervention today will need PT OT to further evaluate and treat   Consultants:  Orthopedic Surgery Dr. Blanchie Dessert Cardiology Dr. Herbie Baltimore   Procedures:  As delineated as above  Antimicrobials:  Anti-infectives (From admission, onward)    None       Subjective: Seen and examined at bedside and he states that he is very anxious about his surgery today.  States he is also feeling hungry.  No nausea or vomiting.  States his pain is fairly well-controlled right now.  No other concerns or complaints at this time.  Objective: Vitals:   04/16/23 1314 04/16/23 1922 04/17/23 0356 04/17/23 0739  BP: (!) 149/90 137/68 (!) 145/70 138/68  Pulse: 61 60 (!) 56 (!) 53  Resp: 18 17 16 16   Temp: 97.7 F (36.5 C) 98.2 F (36.8 C)  97.7 F (36.5 C)  TempSrc: Oral Oral  Oral  SpO2: 100% 100% 100% 100%  Weight:      Height:        Intake/Output Summary (Last 24 hours) at 04/17/2023 1610 Last data filed at 04/16/2023 1708 Gross per 24 hour  Intake 865.16 ml  Output 400 ml  Net 465.16 ml   Filed Weights   04/14/23 1137  Weight: 74.8 kg    Examination: Physical Exam:  Constitutional: Thin African-American male in no acute distress appears little anxious sitting in the chair at bedside Respiratory: Diminished to auscultation bilaterally, no wheezing, rales, rhonchi or crackles. Normal respiratory effort and patient is not tachypenic. No accessory muscle use.  Unlabored breathing Cardiovascular: RRR, no murmurs / rubs / gallops. S1 and S2 auscultated. No extremity edema.   Abdomen: Soft, non-tender, non-distended.  Bowel sounds positive.  GU: Deferred. Musculoskeletal: No clubbing / cyanosis of digits/nails. No joint deformity upper and lower extremities.  Skin: No rashes, lesions, ulcers on limited skin evaluation. No induration; Warm and dry.  Neurologic: CN 2-12 grossly intact with no focal deficits. Romberg sign and cerebellar reflexes not assessed.  Psychiatric: Normal judgment and insight. Alert and oriented x 3.  Mildly anxious but has appropriate mood and affect.  Data Reviewed: I have personally reviewed following labs and imaging studies  CBC: Recent Labs  Lab 04/14/23 1302 04/15/23 0418 04/16/23 0226 04/17/23 0314  WBC 4.9 5.4 5.1 5.4  NEUTROABS 3.2  --  2.5 2.9  HGB 12.0* 12.5* 12.6* 12.8*  HCT 37.5* 39.0 39.9 39.3  MCV 77.0* 76.8* 76.1* 76.3*  PLT 228 227 211 224   Basic Metabolic Panel: Recent Labs  Lab 04/14/23 1302 04/15/23 0418 04/16/23 0226 04/17/23 0314  NA 137 137 141 137  K 3.4* 3.4* 4.3 3.9  CL 105 106 106 104  CO2 25 22 23 23   GLUCOSE 113* 145* 101* 88  BUN 10 13 16 16   CREATININE 0.99 0.98 0.93 0.95  CALCIUM 9.0 8.7* 9.3 9.0  MG  --  1.9 2.1 2.0  PHOS  --  3.5 4.1 4.2   GFR: Estimated Creatinine Clearance: 78.6 mL/min (by C-G formula based on SCr of 0.95 mg/dL). Liver Function Tests: Recent Labs  Lab 04/14/23 1302 04/16/23 0226 04/17/23 0314  AST 27 30 32  ALT 32 35 34  ALKPHOS 59 62 64  BILITOT 0.8 0.5 0.5  PROT 6.1* 6.0* 6.3*  ALBUMIN 3.3* 3.4* 3.4*   No results for input(s): "LIPASE", "AMYLASE" in the last 168 hours. No results for input(s): "AMMONIA" in the last 168 hours. Coagulation Profile: No results for input(s): "INR", "PROTIME" in the last 168 hours. Cardiac Enzymes: No results for input(s): "CKTOTAL", "CKMB", "CKMBINDEX", "TROPONINI" in the last 168 hours. BNP (last 3 results) No results for input(s): "PROBNP" in the last 8760 hours. HbA1C: No results for input(s): "HGBA1C" in the  last 72 hours. CBG: No results for input(s): "GLUCAP" in the last 168 hours. Lipid Profile: No results for input(s): "CHOL", "HDL", "LDLCALC", "TRIG", "CHOLHDL", "LDLDIRECT" in the last 72 hours. Thyroid Function Tests: No results for input(s): "TSH", "T4TOTAL", "FREET4", "T3FREE", "THYROIDAB" in the last 72 hours. Anemia Panel: Recent Labs    04/16/23 0226  VITAMINB12 412  FOLATE 9.3  FERRITIN 73  TIBC 311  IRON 37*  RETICCTPCT 1.4   Sepsis Labs: No results for input(s): "PROCALCITON", "LATICACIDVEN" in the last 168 hours.  No results found for this or any previous visit (from the past 240 hour(s)).   Radiology Studies:  No results found.  Scheduled Meds:  aspirin  81 mg Oral Daily   atorvastatin  80 mg Oral Daily   docusate sodium  100 mg Oral BID   losartan  25 mg Oral Daily   metoprolol succinate  25 mg Oral Daily   multivitamin with minerals  1 tablet Oral Daily   Continuous Infusions:  methocarbamol (ROBAXIN) IV      LOS: 3 days   Marguerita Merles, DO [facility] Available via Epic secure chat 7am-7pm After these hours, please refer to coverage provider listed on [email or web address] 04/17/2023, 8:38 AM

## 2023-04-17 NOTE — Anesthesia Procedure Notes (Addendum)
Procedure Name: Intubation Date/Time: 04/17/2023 12:00 PM  Performed by: Sharyn Dross, CRNAPre-anesthesia Checklist: Patient identified, Emergency Drugs available, Suction available and Patient being monitored Patient Re-evaluated:Patient Re-evaluated prior to induction Oxygen Delivery Method: Circle system utilized Preoxygenation: Pre-oxygenation with 100% oxygen Induction Type: IV induction Ventilation: Mask ventilation without difficulty Laryngoscope Size: Mac and 4 Grade View: Grade I Tube type: Oral Tube size: 7.5 mm Number of attempts: 1 Airway Equipment and Method: Stylet and Oral airway Placement Confirmation: ETT inserted through vocal cords under direct vision, positive ETCO2 and breath sounds checked- equal and bilateral Secured at: 22 cm Tube secured with: Tape Dental Injury: Teeth and Oropharynx as per pre-operative assessment

## 2023-04-17 NOTE — Progress Notes (Signed)
5/31 Pt discharged prior to patient receiving the discharge letter, letter will be mailed to the address on file.

## 2023-04-18 ENCOUNTER — Other Ambulatory Visit: Payer: Self-pay

## 2023-04-18 DIAGNOSIS — I2510 Atherosclerotic heart disease of native coronary artery without angina pectoris: Secondary | ICD-10-CM | POA: Diagnosis not present

## 2023-04-18 DIAGNOSIS — M978XXS Periprosthetic fracture around other internal prosthetic joint, sequela: Secondary | ICD-10-CM | POA: Diagnosis not present

## 2023-04-18 DIAGNOSIS — I10 Essential (primary) hypertension: Secondary | ICD-10-CM | POA: Diagnosis not present

## 2023-04-18 DIAGNOSIS — E782 Mixed hyperlipidemia: Secondary | ICD-10-CM | POA: Diagnosis not present

## 2023-04-18 LAB — CBC WITH DIFFERENTIAL/PLATELET
Abs Immature Granulocytes: 0.03 10*3/uL (ref 0.00–0.07)
Basophils Absolute: 0 10*3/uL (ref 0.0–0.1)
Basophils Relative: 0 %
Eosinophils Absolute: 0 10*3/uL (ref 0.0–0.5)
Eosinophils Relative: 0 %
HCT: 33.5 % — ABNORMAL LOW (ref 39.0–52.0)
Hemoglobin: 10.7 g/dL — ABNORMAL LOW (ref 13.0–17.0)
Immature Granulocytes: 0 %
Lymphocytes Relative: 10 %
Lymphs Abs: 1 10*3/uL (ref 0.7–4.0)
MCH: 24.5 pg — ABNORMAL LOW (ref 26.0–34.0)
MCHC: 31.9 g/dL (ref 30.0–36.0)
MCV: 76.7 fL — ABNORMAL LOW (ref 80.0–100.0)
Monocytes Absolute: 0.9 10*3/uL (ref 0.1–1.0)
Monocytes Relative: 9 %
Neutro Abs: 7.9 10*3/uL — ABNORMAL HIGH (ref 1.7–7.7)
Neutrophils Relative %: 81 %
Platelets: 212 10*3/uL (ref 150–400)
RBC: 4.37 MIL/uL (ref 4.22–5.81)
RDW: 14.1 % (ref 11.5–15.5)
WBC: 9.9 10*3/uL (ref 4.0–10.5)
nRBC: 0 % (ref 0.0–0.2)

## 2023-04-18 LAB — MAGNESIUM: Magnesium: 1.8 mg/dL (ref 1.7–2.4)

## 2023-04-18 LAB — COMPREHENSIVE METABOLIC PANEL
ALT: 31 U/L (ref 0–44)
AST: 31 U/L (ref 15–41)
Albumin: 3.3 g/dL — ABNORMAL LOW (ref 3.5–5.0)
Alkaline Phosphatase: 51 U/L (ref 38–126)
Anion gap: 10 (ref 5–15)
BUN: 19 mg/dL (ref 8–23)
CO2: 22 mmol/L (ref 22–32)
Calcium: 8.8 mg/dL — ABNORMAL LOW (ref 8.9–10.3)
Chloride: 103 mmol/L (ref 98–111)
Creatinine, Ser: 1.21 mg/dL (ref 0.61–1.24)
GFR, Estimated: >60 mL/min (ref >60)
Glucose, Bld: 137 mg/dL — ABNORMAL HIGH (ref 70–99)
Potassium: 4 mmol/L (ref 3.5–5.1)
Sodium: 135 mmol/L (ref 135–145)
Total Bilirubin: 0.6 mg/dL (ref 0.3–1.2)
Total Protein: 5.9 g/dL — ABNORMAL LOW (ref 6.5–8.1)

## 2023-04-18 LAB — PHOSPHORUS: Phosphorus: 3.1 mg/dL (ref 2.5–4.6)

## 2023-04-18 MED ORDER — ACETAMINOPHEN 325 MG PO TABS
650.0000 mg | ORAL_TABLET | Freq: Four times a day (QID) | ORAL | Status: DC
  Administered 2023-04-18 – 2023-04-20 (×9): 650 mg via ORAL
  Filled 2023-04-18 (×9): qty 2

## 2023-04-18 MED ORDER — METHOCARBAMOL 1000 MG/10ML IJ SOLN
500.0000 mg | Freq: Four times a day (QID) | INTRAVENOUS | Status: DC
  Filled 2023-04-18: qty 5

## 2023-04-18 MED ORDER — METHOCARBAMOL 500 MG PO TABS
500.0000 mg | ORAL_TABLET | Freq: Four times a day (QID) | ORAL | Status: DC
  Administered 2023-04-18 – 2023-04-20 (×8): 500 mg via ORAL
  Filled 2023-04-18 (×8): qty 1

## 2023-04-18 MED ORDER — MAGNESIUM SULFATE 2 GM/50ML IV SOLN
2.0000 g | Freq: Once | INTRAVENOUS | Status: AC
  Administered 2023-04-18: 2 g via INTRAVENOUS
  Filled 2023-04-18: qty 50

## 2023-04-18 MED ORDER — OXYCODONE HCL 5 MG PO TABS
5.0000 mg | ORAL_TABLET | Freq: Four times a day (QID) | ORAL | Status: DC | PRN
  Administered 2023-04-18: 10 mg via ORAL
  Administered 2023-04-19: 5 mg via ORAL
  Filled 2023-04-18 (×3): qty 2

## 2023-04-18 NOTE — Progress Notes (Signed)
PROGRESS NOTE    Tyler James  ZOX:[id number] DOB: 10-Jun-1961 DOA: 04/14/2023 PCP: Benetta Spar, MD   Brief Narrative:  The patient is a 62 year old African-American male with a past medical history significant for but not limited to CAD status post stenting, ischemic cardiomyopathy, hypertension as well as other comorbidities who presented with progressively worsening right hip pain.  He reports that he had a hip replacement 2000 and 2010 and things were fine about 6 months ago but then he started developing right hip pain again which progressively got worse.  He went to [facility] last week given that the pain was a bad and x-rays were done and they gave him oral narcotics and told him to follow-up with orthopedics.  Call for an appointment and repeat x-rays in his hip was broken in the joint socket.  Given the pain he has been having trouble sleeping and recently had an MI in January and was planning to have the surgery of his hip but the MI delayed this plan.  He had no chest pain since January and he was a direct admission from orthopedic surgery given that he was seen and a fan on 04/04/2023 for hip pain with some "new angulation" in the hardware.  Given the concern for right hip periprosthetic fracture he was sent to the hospital and cardiology was consulted given his MI in January for clearance.  He was bridged and taken off of the Brilinta and placed on Cangrelor and continues to be on it.  Orthopedic surgery took the patient for surgical intervention on Friday, 04/17/2023 and is postoperative day 1.  PT recommending CIR    Assessment and Plan:   Peri-prosthetic R hip fracture -It appears that this has been present for some time, was previously planned for surgical repair but this was delayed by his MI in 11/2022 -He has had progressive gait instability and is currently minimally ambulatory -Based on recent CAD event, TRH was asked to directly admit the patient to prepare for surgery on  5/31 -Cardiology consulted for clearance and see below -Cangrelor has been discontinued Brilinta to be started tomorrow -Orthopedics consulted and planning surgical intervention given that the patient has been developing progressively worsening symptoms secondary to a skilled hip resurfacing procedure and given the degree of pain and disability they have concerns that his immobility will put him at risk for bedsores, pneumonia, blood clots as well as generalized weakness and ultimately they feel like he would benefit from proceeding with a total hip arthroplasty which will be done on Friday afternoon (04/17/23) -Pain control with Acetaminophen 650 g p.o. every 6h, Methacarbamol 500 mg 4x Daily Muscle Spasms, Tramadol 50 mg po q8hprn Moderate Pain, Oxycodone 5-10 mg po q6hprn Breakthrough Pain, and IV Morphine 2 mg q2hprn Severe Pain -TOC team consult for Rehab Placement  -Will need PT consult post-operatively and recommending CIR -Hip fracture order set utilized -TXA per orthopedics -Continue bowel regimen and has been initiated on docusate 100 mg p.o. twice daily, MiraLAX 17 g daily as needed for mild constipation and if necessary will change to senna docusate 1 tab p.o. twice daily; patient also has a bisacodyl 5 mg p.o. daily as needed for mild constipation -Orthopedic surgery recommending weightbearing as tolerated on right leg with walker or crutches and assistance and recommending posterior hip precautions about 6 weeks and continuing with Prevena to remain in place until follow-up with Dr. Blanchie Dessert -Orthopedic surgery has also added antibiotics and is on IV cefazolin 2 g  every 8 hours for 3 days and recommending continuing cefadroxil 500 mg p.o. twice daily for 7 days at discharge for extended prophylaxis   CAD status post DES to OM2 and RCA in 2017 with left Circumflex in 2024 -Patient is on ASA and Brilinta prior to admission -Transitioned Brilinta to Cangrelor peri-operatively given that  heparin provides no benefit in this situation regarding his dual antiplatelet therapy -No CP since event in January -Cardiac clearance requested from Cardiology and regarding his surgical risk the patient does not have any unstable cardiac conditions and follow-up evaluation he can achieve 4 METS or greater without anginal symptoms and per cardiology his RCRI shows a 6.6% perioperative risk of major cardiac events and requires no further cardiac workup prior to his noncardiac surgery and they feel that he should be an acceptable risk -Cardiology recommends continuing aspirin 81 mg p.o. daily, atorvastatin 80 mg p.o. daily, metoprolol succinate 25 mg p.o. daily and resuming Brilinta preferably postoperative day 1 after surgical intervention but orthopedic surgery recommending starting postoperative day 2 and to be restarted 04/19/23.   HTN -Continue Losartan 25 mg po Daily and Metoprolol Succinate 25 mg po Daily -Continue to Monitor BP per Protocol  -Last BP reading was 134/77   HLD -Continue Atorvastatin 80 mg po Daily    Hyperglycemia -Glucose Level Trend: Recent Labs  Lab 04/14/23 1302 04/15/23 0418 04/16/23 0226 04/17/23 0314 04/18/23 0125  GLUCOSE 113* 145* 101* 88 137*  -Check HbA1c in the AM -Continue monitor CBGs per protocol and if necessary will place on sensitive NovoLog/scale insulin before meals and at bedtime   Tobacco Abuse -He is no longer smoking cigarettes but transitioned to cigars -Declines patch -Needs to stop smoking altogether will need continued smoking cessation counseling   Hypokalemia -Patient's K+ Level Trend: Recent Labs  Lab 04/14/23 1302 04/15/23 0418 04/16/23 0226 04/17/23 0314 04/18/23 0125  K 3.4* 3.4* 4.3 3.9 4.0  -Continue to Monitor and Replete as Necessary -Repeat CMP in the AM     Microcytic Anemia -Hgb/Hct Trend: Recent Labs  Lab 04/14/23 1302 04/15/23 0418 04/16/23 0226 04/17/23 0314 04/18/23 0125  HGB 12.0* 12.5* 12.6* 12.8*  10.7*  HCT 37.5* 39.0 39.9 39.3 33.5*  MCV 77.0* 76.8* 76.1* 76.3* 76.7*  -Checked Anemia Panel and it showed an iron level of 37, UIBC 274, TIBC 311, saturation ratios of 12%, ferritin level of 73, folate level 9.3, vitamin B12 412 -Continue to Monitor for S/Sx of Bleeding as patient is on Heparin gtt; No overt bleeding noted -Repeat CBC in the AM    Hypoalbuminemia -Patient's Albumin Trend: Recent Labs  Lab 04/14/23 1302 04/16/23 0226 04/17/23 0314 04/18/23 0125  ALBUMIN 3.3* 3.4* 3.4* 3.3*  -Continue to Monitor and Trend and repeat CMP in the AM   DVT prophylaxis: SCDs Start: 04/14/23 1317    Code Status: Full Code Family Communication: No family currently at bedside  Disposition Plan:  Level of care: Med-Surg Status is: Inpatient Remains inpatient appropriate because: Needs further clinical improvement and clearance by orthopedic surgery and PT recommending CIR   Consultants:  Orthopedic Surgery  Procedures:  PROCEDURE:  Procedure(s): Conversion right hip resurfacing to total hip arthroplasty  Antimicrobials:  Anti-infectives (From admission, onward)    Start     Dose/Rate Route Frequency Ordered Stop   04/17/23 1630  ceFAZolin (ANCEF) IVPB 2g/100 mL premix        2 g 200 mL/hr over 30 Minutes Intravenous Every 8 hours 04/17/23 1542 04/20/23 1359  04/17/23 1115  ceFAZolin (ANCEF) IVPB 2g/100 mL premix        2 g 200 mL/hr over 30 Minutes Intravenous On call to O.R. 04/17/23 1113 04/17/23 1220       Subjective: Seen and examined at bedside and he was doing okay but he was having some stiffness and spasms.  No nausea or vomiting.  Wanting to rest.  Denies any other concerns or complaints this time.  Objective: Vitals:   04/17/23 2152 04/18/23 0636 04/18/23 0808 04/18/23 1425  BP: 124/62 121/71 131/60 134/77  Pulse: 76 80 70 73  Resp: 17 18 16 16   Temp: 98.5 F (36.9 C) 99 F (37.2 C) 99 F (37.2 C) (!) 97.5 F (36.4 C)  TempSrc: Oral Oral Oral Oral   SpO2: 98% 98% 97% 100%  Weight:      Height:        Intake/Output Summary (Last 24 hours) at 04/18/2023 1502 Last data filed at 04/17/2023 1800 Gross per 24 hour  Intake 100 ml  Output 400 ml  Net -300 ml   Filed Weights   04/14/23 1137  Weight: 74.8 kg   Examination: Physical Exam:  Constitutional: Thin African-American male in no acute distress appears little uncomfortable Respiratory: Diminished to auscultation bilaterally, no wheezing, rales, rhonchi or crackles. Normal respiratory effort and patient is not tachypenic. No accessory muscle use.  Unlabored breathing Cardiovascular: RRR, no murmurs / rubs / gallops. S1 and S2 auscultated. No extremity edema.  Abdomen: Soft, non-tender, non-distended. Bowel sounds positive.  GU: Deferred. Musculoskeletal: No clubbing / cyanosis of digits/nails. No joint deformity upper and lower extremities.  Skin: No rashes, lesions, ulcers on limited skin evaluation. No induration; Warm and dry.  Neurologic: CN 2-12 grossly intact with no focal deficits. Romberg sign and cerebellar reflexes not assessed.  Psychiatric: Normal judgment and insight. Alert and oriented x 3.  Has a normal mood and affect  Data Reviewed: I have personally reviewed following labs and imaging studies  CBC: Recent Labs  Lab 04/14/23 1302 04/15/23 0418 04/16/23 0226 04/17/23 0314 04/18/23 0125  WBC 4.9 5.4 5.1 5.4 9.9  NEUTROABS 3.2  --  2.5 2.9 7.9*  HGB 12.0* 12.5* 12.6* 12.8* 10.7*  HCT 37.5* 39.0 39.9 39.3 33.5*  MCV 77.0* 76.8* 76.1* 76.3* 76.7*  PLT 228 227 211 224 212   Basic Metabolic Panel: Recent Labs  Lab 04/14/23 1302 04/15/23 0418 04/16/23 0226 04/17/23 0314 04/18/23 0125  NA 137 137 141 137 135  K 3.4* 3.4* 4.3 3.9 4.0  CL 105 106 106 104 103  CO2 25 22 23 23 22   GLUCOSE 113* 145* 101* 88 137*  BUN 10 13 16 16 19   CREATININE 0.99 0.98 0.93 0.95 1.21  CALCIUM 9.0 8.7* 9.3 9.0 8.8*  MG  --  1.9 2.1 2.0 1.8  PHOS  --  3.5 4.1 4.2 3.1    GFR: Estimated Creatinine Clearance: 61.7 mL/min (by C-G formula based on SCr of 1.21 mg/dL). Liver Function Tests: Recent Labs  Lab 04/14/23 1302 04/16/23 0226 04/17/23 0314 04/18/23 0125  AST 27 30 32 31  ALT 32 35 34 31  ALKPHOS 59 62 64 51  BILITOT 0.8 0.5 0.5 0.6  PROT 6.1* 6.0* 6.3* 5.9*  ALBUMIN 3.3* 3.4* 3.4* 3.3*   No results for input(s): "LIPASE", "AMYLASE" in the last 168 hours. No results for input(s): "AMMONIA" in the last 168 hours. Coagulation Profile: No results for input(s): "INR", "PROTIME" in the last 168 hours. Cardiac Enzymes:  No results for input(s): "CKTOTAL", "CKMB", "CKMBINDEX", "TROPONINI" in the last 168 hours. BNP (last 3 results) No results for input(s): "PROBNP" in the last 8760 hours. HbA1C: No results for input(s): "HGBA1C" in the last 72 hours. CBG: No results for input(s): "GLUCAP" in the last 168 hours. Lipid Profile: No results for input(s): "CHOL", "HDL", "LDLCALC", "TRIG", "CHOLHDL", "LDLDIRECT" in the last 72 hours. Thyroid Function Tests: No results for input(s): "TSH", "T4TOTAL", "FREET4", "T3FREE", "THYROIDAB" in the last 72 hours. Anemia Panel: Recent Labs    04/16/23 0226  VITAMINB12 412  FOLATE 9.3  FERRITIN 73  TIBC 311  IRON 37*  RETICCTPCT 1.4   Sepsis Labs: No results for input(s): "PROCALCITON", "LATICACIDVEN" in the last 168 hours.  Recent Results (from the past 240 hour(s))  Surgical pcr screen     Status: None   Collection Time: 04/17/23  7:16 AM   Specimen: Nasal Mucosa; Nasal Swab  Result Value Ref Range Status   MRSA, PCR NEGATIVE NEGATIVE Final   Staphylococcus aureus NEGATIVE NEGATIVE Final    Comment: (NOTE) The Xpert SA Assay (FDA approved for NASAL specimens in patients 64 years of age and older), is one component of a comprehensive surveillance program. It is not intended to diagnose infection nor to guide or monitor treatment. Performed at [facility], [address], [place],  [place] [address]   Aerobic/Anaerobic Culture w Gram Stain (surgical/deep wound)     Status: None (Preliminary result)   Collection Time: 04/17/23  1:45 PM   Specimen: Soft Tissue, Other  Result Value Ref Range Status   Specimen Description TISSUE RIGHT HIP  Final   Special Requests NONE  Final   Gram Stain NO WBC SEEN NO ORGANISMS SEEN   Final   Culture   Final    NO GROWTH < 24 HOURS Performed at [facility], [address] [place], [place] [address]    Report Status PENDING  Incomplete     Radiology Studies: DG HIP UNILAT W OR W/O PELVIS 2-3 VIEWS RIGHT  Result Date: 04/17/2023 CLINICAL DATA:  [id number] Post-operative state [id number] EXAM: DG HIP (WITH OR WITHOUT PELVIS) 2-3V RIGHT COMPARISON:  04/04/2023 FINDINGS: Postsurgical changes of right hip arthroplasty. Normal alignment. No evidence of loosening or periprosthetic fracture. Expected soft tissue changes. Unchanged left femoral head arthroplasty. IMPRESSION: Postsurgical changes of right hip arthroplasty. Normal alignment. No evidence of immediate hardware complication. Electronically Signed   By: Caprice Renshaw M.D.   On: 04/17/2023 16:38   DG HIP UNILAT WITH PELVIS 1V RIGHT  Result Date: 04/17/2023 CLINICAL DATA:  Elective surgery EXAM: DG HIP (WITH OR WITHOUT PELVIS) 1V RIGHT COMPARISON:  Preoperative exam 04/04/2023 FINDINGS: Two fluoroscopic spot views of the right hip obtained in the operating room. Removal of previous right hip hardware with interval hip arthroplasty. Fluoroscopy time 4.4 seconds. Dose 0.24 mGy. IMPRESSION: Intraoperative fluoroscopy during right hip arthroplasty. Electronically Signed   By: Narda Rutherford M.D.   On: 04/17/2023 14:59   DG C-Arm 1-60 Min-No Report  Result Date: 04/17/2023 Fluoroscopy was utilized by the requesting physician.  No radiographic interpretation.   DG C-Arm 1-60 Min-No Report  Result Date: 04/17/2023 Fluoroscopy was utilized by the requesting physician.  No radiographic  interpretation.     Scheduled Meds:  acetaminophen  650 mg Oral Q6H   aspirin EC  81 mg Oral BID   atorvastatin  80 mg Oral Daily   docusate sodium  100 mg Oral BID   losartan  25 mg Oral Daily   methocarbamol  500 mg Oral QID   metoprolol succinate  25 mg Oral Daily   multivitamin with minerals  1 tablet Oral Daily   [START ON 04/19/2023] ticagrelor  90 mg Oral BID   Continuous Infusions:   ceFAZolin (ANCEF) IV 2 g (04/18/23 1448)   methocarbamol (ROBAXIN) IV      LOS: 4 days   Marguerita Merles, DO [facility] Available via Epic secure chat 7am-7pm After these hours, please refer to coverage provider listed on [email or web address] 04/18/2023, 3:02 PM

## 2023-04-18 NOTE — Progress Notes (Addendum)
Physical Therapy Evaluation Patient Details Name: Tyler James MRN: [id number] DOB: 1961-08-28 Today's Date: 04/18/2023  History of Present Illness  62 y.o. male admitted 5/28 with Peri-prosthetic R hip fracture, s/p Conversion right hip resurfacing to total hip arthroplasty on 5/31. PMHx: CAD status post stenting, ischemic cardiomyopathy, hypertension.   Clinical Impression  Patient is s/p above surgery resulting in functional limitations due to the deficits listed below (see PT Problem List). Demonstrated greater difficulty with mobility than anticipated. Up to mod assist for mobility, very slow, pain limited. Short distance gait in room with RW and light physical assistance to mobilize safely. Has a roommate that he states can help at home 24/7 but questions if he is okay with her assisting with more private ADLs such as bathing/dressing/hygiene. At present, he would require considerable assistance at home but I anticipate as his pain improves he will mobilize with greater ease. Have requested CIR screen. If not a candidate may benefit from short term SNF. Hopeful to see some more rapid improvement prior to d/c. Will update recs as appropriate. Patient will benefit from acute skilled PT to increase their independence and safety with mobility to facilitate discharge.        Recommendations for follow up therapy are one component of a multi-disciplinary discharge planning process, led by the attending physician.  Recommendations may be updated based on patient status, additional functional criteria and insurance authorization.  Follow Up Recommendations       Assistance Recommended at Discharge Frequent or constant Supervision/Assistance  Patient can return home with the following  A lot of help with walking and/or transfers;A lot of help with bathing/dressing/bathroom;Assistance with cooking/housework;Assist for transportation;Help with stairs or ramp for entrance    Equipment Recommendations  Rolling walker (2 wheels)  Recommendations for Other Services  OT consult;Rehab consult    Functional Status Assessment Patient has had a recent decline in their functional status and demonstrates the ability to make significant improvements in function in a reasonable and predictable amount of time.     Precautions / Restrictions Precautions Precautions: Posterior Hip Precaution Booklet Issued: Yes (comment) Precaution Comments: Handout provided, reviewed Restrictions Weight Bearing Restrictions: Yes RLE Weight Bearing: Weight bearing as tolerated      Mobility  Bed Mobility Overal bed mobility: Needs Assistance Bed Mobility: Supine to Sit     Supine to sit: Mod assist     General bed mobility comments: Mod assist for RLE support and trunk to rise to EOB. Educated on techniques to support RLE as needed and to safely maintain posterior hip precautions. Considerable time to complete due to pain.    Transfers Overall transfer level: Needs assistance Equipment used: Rolling walker (2 wheels) Transfers: Sit to/from Stand Sit to Stand: Min assist           General transfer comment: Slow and guarded. VC for technique and sequencing to rise from elevated bed surface. Min assist for boost and to stabilize RW.    Ambulation/Gait Ambulation/Gait assistance: Min assist Gait Distance (Feet): 15 Feet Assistive device: Rolling walker (2 wheels) Gait Pattern/deviations: Step-to pattern, Decreased stance time - right, Decreased stride length, Decreased weight shift to right, Antalgic Gait velocity: decr Gait velocity interpretation: <1.31 ft/sec, indicative of household ambulator   General Gait Details: Educated on safe AD use with RW for support. Min assist for RW control and sequencing. Progressed with advancing RLE forward, however tolerates only TDWB through RLE.  Stairs  Wheelchair Mobility    Modified Rankin (Stroke Patients Only)       Balance  Overall balance assessment: Needs assistance Sitting-balance support: Single extremity supported, Feet supported Sitting balance-Leahy Scale: Poor Sitting balance - Comments: Bracing due to pain Postural control: Posterior lean, Left lateral lean Standing balance support: Bilateral upper extremity supported Standing balance-Leahy Scale: Poor                               Pertinent Vitals/Pain Pain Assessment Pain Assessment: 0-10 Pain Score: 9  Pain Location: Rt hip Pain Descriptors / Indicators: Aching Pain Intervention(s): Monitored during session, Repositioned, Limited activity within patient's tolerance    Home Living Family/patient expects to be discharged to:: Private residence Living Arrangements: Non-relatives/Friends Available Help at Discharge: Available 24 hours/day;Friend(s) Type of Home: House Home Access: Ramped entrance       Home Layout: One level Home Equipment: Crutches      Prior Function Prior Level of Function : Independent/Modified Independent             Mobility Comments: ind prior to onset of pain ADLs Comments: ind     Hand Dominance   Dominant Hand: Right    Extremity/Trunk Assessment   Upper Extremity Assessment Upper Extremity Assessment: Defer to OT evaluation    Lower Extremity Assessment Lower Extremity Assessment: RLE deficits/detail RLE Deficits / Details: muscle guarding, post op guarding, unable to volitionally lift against gravity. RLE: Unable to fully assess due to pain       Communication      Cognition Arousal/Alertness: Awake/alert Behavior During Therapy: WFL for tasks assessed/performed Overall Cognitive Status: Within Functional Limits for tasks assessed                                          General Comments      Exercises Total Joint Exercises Ankle Circles/Pumps: Strengthening, Both, 10 reps, Supine Quad Sets: Strengthening, Both, 10 reps, Supine Gluteal Sets:  Strengthening, Both, 10 reps, Supine   Assessment/Plan    PT Assessment Patient needs continued PT services  PT Problem List Decreased strength;Decreased range of motion;Decreased activity tolerance;Decreased balance;Decreased mobility;Decreased knowledge of use of DME;Decreased knowledge of precautions;Pain       PT Treatment Interventions DME instruction;Gait training;Functional mobility training;Therapeutic activities;Therapeutic exercise;Balance training;Neuromuscular re-education;Patient/family education;Modalities    PT Goals (Current goals can be found in the Care Plan section)  Acute Rehab PT Goals Patient Stated Goal: Get stronger before going home. Control pain. PT Goal Formulation: With patient Time For Goal Achievement: 04/25/23 Potential to Achieve Goals: Good    Frequency Min 5X/week     Co-evaluation               AM-PAC PT "6 Clicks" Mobility  Outcome Measure Help needed turning from your back to your side while in a flat bed without using bedrails?: A Lot Help needed moving from lying on your back to sitting on the side of a flat bed without using bedrails?: A Lot Help needed moving to and from a bed to a chair (including a wheelchair)?: A Little Help needed standing up from a chair using your arms (e.g., wheelchair or bedside chair)?: A Little Help needed to walk in hospital room?: A Little Help needed climbing 3-5 steps with a railing? : Total 6 Click Score: 14  End of Session Equipment Utilized During Treatment: Gait belt Activity Tolerance: Patient limited by pain Patient left: in chair;with call bell/phone within reach;with chair alarm set;with nursing/sitter in room;with SCD's reapplied Nurse Communication: Mobility status;Patient requests pain meds;Precautions PT Visit Diagnosis: Unsteadiness on feet (R26.81);Other abnormalities of gait and mobility (R26.89);Difficulty in walking, not elsewhere classified (R26.2);Pain Pain - Right/Left:  Right Pain - part of body: Hip    Time: 0981-1914 PT Time Calculation (min) (ACUTE ONLY): 46 min   Charges:   PT Evaluation $PT Eval Low Complexity: 1 Low PT Treatments $Gait Training: 8-22 mins $Therapeutic Activity: 8-22 mins        Kathlyn Sacramento, PT, DPT Physical Therapist Acute Rehabilitation Services [facility] [phone]   Berton Mount 04/18/2023, 2:47 PM

## 2023-04-18 NOTE — Progress Notes (Signed)
Orthopaedic Trauma Service Progress Note  Patient ID: Tyler James MRN: [id number] DOB/AGE: 1961-06-19 62 y.o.  Subjective:  Doing quite well this am  Reports more burning around the operative site than sharp pain  No specific complaints No CP or SOB No N/V No Abd pain   Lives with a roommate and plans to return there  Denies any numbness or tingling in distal R leg    Intra-op cultures: NGTD   Labs and vitals look good this am   + void + flatus  No BM   ROS As above  Objective:   VITALS:   Vitals:   04/17/23 1535 04/17/23 2152 04/18/23 0636 04/18/23 0808  BP: (!) 154/68 124/62 121/71 131/60  Pulse: 61 76 80 70  Resp: 16 17 18 16   Temp: 97.6 F (36.4 C) 98.5 F (36.9 C) 99 F (37.2 C) 99 F (37.2 C)  TempSrc: Oral Oral Oral Oral  SpO2: 100% 98% 98% 97%  Weight:      Height:        Estimated body mass index is 24.37 kg/m as calculated from the following:   Height as of this encounter: 5\' 9"  (1.753 m).   Weight as of this encounter: 74.8 kg.   Intake/Output      05/31 0701 06/01 0700 06/01 0701 06/02 0700   P.O.     I.V. (mL/kg) 1500 (20.1)    IV Piggyback 100    Total Intake(mL/kg) 1600 (21.4)    Urine (mL/kg/hr) 400 (0.2)    Blood 300    Total Output 700    Net +900           LABS  Results for orders placed or performed during the hospital encounter of 04/14/23 (from the past 24 hour(s))  Aerobic/Anaerobic Culture w Gram Stain (surgical/deep wound)     Status: None (Preliminary result)   Collection Time: 04/17/23  1:45 PM   Specimen: Soft Tissue, Other  Result Value Ref Range   Specimen Description TISSUE RIGHT HIP    Special Requests NONE    Gram Stain      NO WBC SEEN NO ORGANISMS SEEN Performed at [facility], [address], [place], [place] [address]    Culture PENDING    Report Status PENDING   CBC with Differential/Platelet     Status: Abnormal    Collection Time: 04/18/23  1:25 AM  Result Value Ref Range   WBC 9.9 4.0 - 10.5 K/uL   RBC 4.37 4.22 - 5.81 MIL/uL   Hemoglobin 10.7 (L) 13.0 - 17.0 g/dL   HCT 19.1 (L) 47.8 - 29.5 %   MCV 76.7 (L) 80.0 - 100.0 fL   MCH 24.5 (L) 26.0 - 34.0 pg   MCHC 31.9 30.0 - 36.0 g/dL   RDW 62.1 30.8 - 65.7 %   Platelets 212 150 - 400 K/uL   nRBC 0.0 0.0 - 0.2 %   Neutrophils Relative % 81 %   Neutro Abs 7.9 (H) 1.7 - 7.7 K/uL   Lymphocytes Relative 10 %   Lymphs Abs 1.0 0.7 - 4.0 K/uL   Monocytes Relative 9 %   Monocytes Absolute 0.9 0.1 - 1.0 K/uL   Eosinophils Relative 0 %   Eosinophils Absolute 0.0 0.0 - 0.5 K/uL   Basophils Relative 0 %  Basophils Absolute 0.0 0.0 - 0.1 K/uL   Immature Granulocytes 0 %   Abs Immature Granulocytes 0.03 0.00 - 0.07 K/uL  Comprehensive metabolic panel     Status: Abnormal   Collection Time: 04/18/23  1:25 AM  Result Value Ref Range   Sodium 135 135 - 145 mmol/L   Potassium 4.0 3.5 - 5.1 mmol/L   Chloride 103 98 - 111 mmol/L   CO2 22 22 - 32 mmol/L   Glucose, Bld 137 (H) 70 - 99 mg/dL   BUN 19 8 - 23 mg/dL   Creatinine, Ser 2.95 0.61 - 1.24 mg/dL   Calcium 8.8 (L) 8.9 - 10.3 mg/dL   Total Protein 5.9 (L) 6.5 - 8.1 g/dL   Albumin 3.3 (L) 3.5 - 5.0 g/dL   AST 31 15 - 41 U/L   ALT 31 0 - 44 U/L   Alkaline Phosphatase 51 38 - 126 U/L   Total Bilirubin 0.6 0.3 - 1.2 mg/dL   GFR, Estimated >28 >41 mL/min   Anion gap 10 5 - 15  Magnesium     Status: None   Collection Time: 04/18/23  1:25 AM  Result Value Ref Range   Magnesium 1.8 1.7 - 2.4 mg/dL  Phosphorus     Status: None   Collection Time: 04/18/23  1:25 AM  Result Value Ref Range   Phosphorus 3.1 2.5 - 4.6 mg/dL     PHYSICAL EXAM:   Gen: in bed, sitting up, NAD, pleasant Lungs: unlabored Abd:  soft, NTND, + BS  Ext:       Right Lower Extremity   Prevena incisional vac in place and with good suction   No drainage noted  Resting with hip and knee in slight flexion for comfort   Ext  warm   No pitting edema  EHL and ankle extension intact  FHL and ankle flexion intact  Ankle inversion and eversion intact  + quad set   + DP pulse  No DCT   Assessment/Plan: 1 Day Post-Op   Principal Problem:   Periprosthetic hip fracture, sequela Active Problems:   Tobacco abuse   Hypertension   Hyperlipidemia   CAD S/P percutaneous coronary angioplasty   Anti-infectives (From admission, onward)    Start     Dose/Rate Route Frequency Ordered Stop   04/17/23 1630  ceFAZolin (ANCEF) IVPB 2g/100 mL premix        2 g 200 mL/hr over 30 Minutes Intravenous Every 8 hours 04/17/23 1542 04/20/23 1359   04/17/23 1115  ceFAZolin (ANCEF) IVPB 2g/100 mL premix        2 g 200 mL/hr over 30 Minutes Intravenous On call to O.R. 04/17/23 1113 04/17/23 1220     .  POD/HD#: 19  62 year old male s/p right hip resurfacing with right femoral neck stress fracture s/p conversion to right total hip arthroplasty  -Conversion right total hip arthroplasty for right femoral neck fracture s/p right hip resurfacing Weightbearing Weight-bear as tolerated right leg with walker or crutches and assistance   ROM/Activity   Posterior hip precautions for about 6 weeks    She will increase activity level   Wound care   Prevena to remain in place until follow-up with Dr. Blanchie Dessert   - Pain management:  Multimodal  Usage as far has seen reasonable    Will schedule his Tylenol and Robaxin   Oxycodone 5 to 10 mg po q6h prn breakthrough pain     He has been on Ultram prior to admission  and this has been reordered as 50 mg every 8 hours as needed for moderate pain  - ABL anemia/Hemodynamics  Stable, monitor  - Medical issues   Per medicine  - DVT/PE prophylaxis:  Currently on aspirin  Brilinta to restart tomorrow - ID:   Perioperative antibiotics  Patient to discharge on cefadroxil 500 mg p.o. twice daily for 7 days at discharge for extended prophylaxis  - Metabolic Bone Disease:  Check  vitamin D level - Activity:  As above - FEN/GI prophylaxis/Foley/Lines:  Regular diet  - Dispo:  Ortho issues stable  Follow-up with Dr. Blanchie Dessert in 2 weeks     Mearl Latin, PA-C [phone] (C) 04/18/2023, 10:13 AM  [facility] [address] [place] [address] [phone] [phone] (F)    After 5pm and on the weekends please log on to Amion, go to orthopaedics and the look under the [facility] Call for the provider(s) on call. You can also call our office at [phone] and then follow the prompts to be connected to the call team.  Patient ID: Tyler James, male   DOB: 02/16/1961, 62 y.o.   MRN: [id number]

## 2023-04-19 DIAGNOSIS — I10 Essential (primary) hypertension: Secondary | ICD-10-CM | POA: Diagnosis not present

## 2023-04-19 DIAGNOSIS — E782 Mixed hyperlipidemia: Secondary | ICD-10-CM | POA: Diagnosis not present

## 2023-04-19 DIAGNOSIS — M978XXS Periprosthetic fracture around other internal prosthetic joint, sequela: Secondary | ICD-10-CM | POA: Diagnosis not present

## 2023-04-19 DIAGNOSIS — I2510 Atherosclerotic heart disease of native coronary artery without angina pectoris: Secondary | ICD-10-CM | POA: Diagnosis not present

## 2023-04-19 LAB — CBC WITH DIFFERENTIAL/PLATELET
Abs Immature Granulocytes: 0.06 10*3/uL (ref 0.00–0.07)
Basophils Absolute: 0 10*3/uL (ref 0.0–0.1)
Basophils Relative: 1 %
Eosinophils Absolute: 0.1 10*3/uL (ref 0.0–0.5)
Eosinophils Relative: 1 %
HCT: 30.6 % — ABNORMAL LOW (ref 39.0–52.0)
Hemoglobin: 10 g/dL — ABNORMAL LOW (ref 13.0–17.0)
Immature Granulocytes: 1 %
Lymphocytes Relative: 17 %
Lymphs Abs: 1.3 10*3/uL (ref 0.7–4.0)
MCH: 25.1 pg — ABNORMAL LOW (ref 26.0–34.0)
MCHC: 32.7 g/dL (ref 30.0–36.0)
MCV: 76.7 fL — ABNORMAL LOW (ref 80.0–100.0)
Monocytes Absolute: 1.2 10*3/uL — ABNORMAL HIGH (ref 0.1–1.0)
Monocytes Relative: 15 %
Neutro Abs: 5.1 10*3/uL (ref 1.7–7.7)
Neutrophils Relative %: 65 %
Platelets: 183 10*3/uL (ref 150–400)
RBC: 3.99 MIL/uL — ABNORMAL LOW (ref 4.22–5.81)
RDW: 14.3 % (ref 11.5–15.5)
WBC: 7.9 10*3/uL (ref 4.0–10.5)
nRBC: 0 % (ref 0.0–0.2)

## 2023-04-19 LAB — COMPREHENSIVE METABOLIC PANEL
ALT: 24 U/L (ref 0–44)
AST: 29 U/L (ref 15–41)
Albumin: 3.2 g/dL — ABNORMAL LOW (ref 3.5–5.0)
Alkaline Phosphatase: 53 U/L (ref 38–126)
Anion gap: 6 (ref 5–15)
BUN: 15 mg/dL (ref 8–23)
CO2: 26 mmol/L (ref 22–32)
Calcium: 8.7 mg/dL — ABNORMAL LOW (ref 8.9–10.3)
Chloride: 105 mmol/L (ref 98–111)
Creatinine, Ser: 1.11 mg/dL (ref 0.61–1.24)
GFR, Estimated: >60 mL/min (ref >60)
Glucose, Bld: 133 mg/dL — ABNORMAL HIGH (ref 70–99)
Potassium: 3.8 mmol/L (ref 3.5–5.1)
Sodium: 137 mmol/L (ref 135–145)
Total Bilirubin: 0.8 mg/dL (ref 0.3–1.2)
Total Protein: 6 g/dL — ABNORMAL LOW (ref 6.5–8.1)

## 2023-04-19 LAB — PHOSPHORUS: Phosphorus: 3 mg/dL (ref 2.5–4.6)

## 2023-04-19 LAB — MAGNESIUM: Magnesium: 2.1 mg/dL (ref 1.7–2.4)

## 2023-04-19 LAB — VITAMIN D 25 HYDROXY (VIT D DEFICIENCY, FRACTURES): Vit D, 25-Hydroxy: 28.43 ng/mL — ABNORMAL LOW (ref 30–100)

## 2023-04-19 NOTE — Plan of Care (Signed)

## 2023-04-19 NOTE — Progress Notes (Signed)
Nutrition Brief Note:   MD consult for increased protein needs. RD team is already following patient. Pt was seen on 5/29 by RD, please refer to previosu note for full assessment. RD addressed increased protein needs. Will continue to monitor and f/u per protocol.   Bethann Humble, RD, LDN, CNSC.

## 2023-04-19 NOTE — Progress Notes (Signed)
Physical Therapy Treatment Patient Details Name: Tyler James MRN: [id number] DOB: 1961-03-31 Today's Date: 04/19/2023   History of Present Illness 62 y.o. male admitted 5/28 with Peri-prosthetic R hip fracture, s/p Conversion right hip resurfacing to total hip arthroplasty on 5/31. PMHx: CAD status post stenting, ischemic cardiomyopathy, hypertension.    PT Comments    Continuing work on functional mobility and activity tolerance;  Much improved mobility today, with a lot less pain and very nice weight acceptance onto RLE in stance; made gait and gait distance much better, and he is abl eto walk near household distances; we discussed considerations for dc home, and have updated plan for dc home; Consulted OT for ADLs in the setting of posterior hip precautions   Recommendations for follow up therapy are one component of a multi-disciplinary discharge planning process, led by the attending physician.  Recommendations may be updated based on patient status, additional functional criteria and insurance authorization.  Follow Up Recommendations       Assistance Recommended at Discharge Intermittent Supervision/Assistance  Patient can return home with the following A little help with walking and/or transfers;Assistance with cooking/housework   Equipment Recommendations  Rolling walker (2 wheels)    Recommendations for Other Services OT consult     Precautions / Restrictions Precautions Precautions: Posterior Hip Precaution Comments: Discussed post hip prec wth bed mobility Restrictions RLE Weight Bearing: Weight bearing as tolerated     Mobility  Bed Mobility Overal bed mobility: Needs Assistance Bed Mobility: Supine to Sit     Supine to sit: Mod assist     General bed mobility comments: Mod assist for RLE support and trunk to rise to EOB. Educated on techniques to support RLE as needed and to safely maintain posterior hip precautions.    Transfers Overall transfer level:  Needs assistance Equipment used: Rolling walker (2 wheels) Transfers: Sit to/from Stand Sit to Stand: Min assist           General transfer comment: Slow and guarded. VC for technique and sequencing to rise from elevated bed surface. Min assist for boost and to stabilize RW.    Ambulation/Gait Ambulation/Gait assistance: Min guard (with and without physical contact) Gait Distance (Feet): 125 Feet Assistive device: Rolling walker (2 wheels) Gait Pattern/deviations: Step-through pattern (emerging) Gait velocity: decr     General Gait Details: Much improved weight acceptance RLE in stance; Able to improve gait distance considerably   Stairs             Wheelchair Mobility    Modified Rankin (Stroke Patients Only)       Balance Overall balance assessment: Needs assistance   Sitting balance-Leahy Scale: Fair       Standing balance-Leahy Scale: Fair                              Cognition Arousal/Alertness: Awake/alert Behavior During Therapy: WFL for tasks assessed/performed Overall Cognitive Status: Within Functional Limits for tasks assessed                                          Exercises      General Comments General comments (skin integrity, edema, etc.): Discussed considerations fo rdc home      Pertinent Vitals/Pain Pain Assessment Pain Assessment: 0-10 Pain Score: 3  Pain Location: Rt hip Pain Descriptors / Indicators: Aching  Pain Intervention(s): Monitored during session    Home Living                          Prior Function            PT Goals (current goals can now be found in the care plan section) Acute Rehab PT Goals Patient Stated Goal: Get stronger before going home. Control pain. PT Goal Formulation: With patient Time For Goal Achievement: 04/25/23 Potential to Achieve Goals: Good Progress towards PT goals: Progressing toward goals    Frequency    Min 5X/week      PT Plan  Discharge plan needs to be updated    Co-evaluation              AM-PAC PT "6 Clicks" Mobility   Outcome Measure  Help needed turning from your back to your side while in a flat bed without using bedrails?: A Little Help needed moving from lying on your back to sitting on the side of a flat bed without using bedrails?: A Lot Help needed moving to and from a bed to a chair (including a wheelchair)?: A Little Help needed standing up from a chair using your arms (e.g., wheelchair or bedside chair)?: A Little Help needed to walk in hospital room?: A Little Help needed climbing 3-5 steps with a railing? : A Lot 6 Click Score: 16    End of Session   Activity Tolerance: Patient tolerated treatment well Patient left: in chair;with call bell/phone within reach;with family/visitor present Nurse Communication: Mobility status PT Visit Diagnosis: Unsteadiness on feet (R26.81);Other abnormalities of gait and mobility (R26.89);Difficulty in walking, not elsewhere classified (R26.2);Pain Pain - Right/Left: Right Pain - part of body: Hip     Time: 9562-1308 PT Time Calculation (min) (ACUTE ONLY): 23 min  Charges:  $Gait Training: 23-37 mins                     Van Clines, PT  Acute Rehabilitation Services Office [phone] Secure Chat welcomed    [name] 04/19/2023, 5:50 PM

## 2023-04-19 NOTE — Progress Notes (Signed)
Patient ID: Tyler James MRN: [id number] DOB/AGE: February 27, 1961 62 y.o.  Subjective:  No complaints this morning, burning sensation has resolved. Eager to discharge. No CP or SOB No N/V No Abd pain   Lives with a roommate and plans to return there, though CIR has been consulted as well. Patient is willing to do either.  Denies any numbness or tingling in distal R leg  Intra-op cultures: NGTD   + void + flatus  No BM yet   ROS As above  Objective:   VITALS:   Vitals:   04/18/23 1425 04/18/23 2114 04/19/23 0429 04/19/23 0846  BP: 134/77 (!) 119/55 130/68 (!) 127/57  Pulse: 73 71 72 68  Resp: 16 16 16 18   Temp: (!) 97.5 F (36.4 C) 99 F (37.2 C)  98 F (36.7 C)  TempSrc: Oral Oral    SpO2: 100% 100% 100% 99%  Weight:      Height:        Estimated body mass index is 24.37 kg/m as calculated from the following:   Height as of this encounter: 5\' 9"  (1.753 m).   Weight as of this encounter: 74.8 kg.   Intake/Output      06/01 0701 06/02 0700 06/02 0701 06/03 0700   P.O. 420 120   I.V. (mL/kg)     IV Piggyback 150    Total Intake(mL/kg) 570 (7.6) 120 (1.6)   Urine (mL/kg/hr) 1825 (1) 500 (3.5)   Blood     Total Output 1825 500   Net -1255 -380          LABS  Results for orders placed or performed during the hospital encounter of 04/14/23 (from the past 24 hour(s))  VITAMIN D 25 Hydroxy (Vit-D Deficiency, Fractures)     Status: Abnormal   Collection Time: 04/19/23  1:54 AM  Result Value Ref Range   Vit D, 25-Hydroxy 28.43 (L) 30 - 100 ng/mL  CBC with Differential/Platelet     Status: Abnormal   Collection Time: 04/19/23  1:54 AM  Result Value Ref Range   WBC 7.9 4.0 - 10.5 K/uL   RBC 3.99 (L) 4.22 - 5.81 MIL/uL   Hemoglobin 10.0 (L) 13.0 - 17.0 g/dL   HCT 40.9 (L) 81.1 - 91.4 %   MCV 76.7 (L) 80.0 - 100.0 fL   MCH 25.1 (L) 26.0 - 34.0 pg   MCHC 32.7 30.0 - 36.0 g/dL   RDW 78.2 95.6 - 21.3 %   Platelets 183 150 - 400 K/uL   nRBC 0.0 0.0 - 0.2 %    Neutrophils Relative % 65 %   Neutro Abs 5.1 1.7 - 7.7 K/uL   Lymphocytes Relative 17 %   Lymphs Abs 1.3 0.7 - 4.0 K/uL   Monocytes Relative 15 %   Monocytes Absolute 1.2 (H) 0.1 - 1.0 K/uL   Eosinophils Relative 1 %   Eosinophils Absolute 0.1 0.0 - 0.5 K/uL   Basophils Relative 1 %   Basophils Absolute 0.0 0.0 - 0.1 K/uL   Immature Granulocytes 1 %   Abs Immature Granulocytes 0.06 0.00 - 0.07 K/uL  Comprehensive metabolic panel     Status: Abnormal   Collection Time: 04/19/23  1:54 AM  Result Value Ref Range   Sodium 137 135 - 145 mmol/L   Potassium 3.8 3.5 - 5.1 mmol/L   Chloride 105 98 - 111 mmol/L   CO2 26 22 - 32 mmol/L   Glucose, Bld 133 (H) 70 - 99 mg/dL  BUN 15 8 - 23 mg/dL   Creatinine, Ser 1.61 0.61 - 1.24 mg/dL   Calcium 8.7 (L) 8.9 - 10.3 mg/dL   Total Protein 6.0 (L) 6.5 - 8.1 g/dL   Albumin 3.2 (L) 3.5 - 5.0 g/dL   AST 29 15 - 41 U/L   ALT 24 0 - 44 U/L   Alkaline Phosphatase 53 38 - 126 U/L   Total Bilirubin 0.8 0.3 - 1.2 mg/dL   GFR, Estimated >09 >60 mL/min   Anion gap 6 5 - 15  Phosphorus     Status: None   Collection Time: 04/19/23  1:54 AM  Result Value Ref Range   Phosphorus 3.0 2.5 - 4.6 mg/dL  Magnesium     Status: None   Collection Time: 04/19/23  1:54 AM  Result Value Ref Range   Magnesium 2.1 1.7 - 2.4 mg/dL     PHYSICAL EXAM:   Gen: in bed, sitting up eating breakfast, NAD, pleasant Lungs: unlabored Abd:  soft, NTND Ext:       Right Lower Extremity   Prevena incisional vac in place and with good suction, no drainage in canister   Ext warm   No pitting edema  EHL and ankle extension intact  FHL and ankle flexion intact  Ankle inversion and eversion intact  + DP pulse   Assessment/Plan: 2 Days Post-Op   Principal Problem:   Periprosthetic hip fracture, sequela Active Problems:   Tobacco abuse   Hypertension   Hyperlipidemia   CAD S/P percutaneous coronary angioplasty   Anti-infectives (From admission, onward)    Start      Dose/Rate Route Frequency Ordered Stop   04/17/23 1630  ceFAZolin (ANCEF) IVPB 2g/100 mL premix        2 g 200 mL/hr over 30 Minutes Intravenous Every 8 hours 04/17/23 1542 04/20/23 1359   04/17/23 1115  ceFAZolin (ANCEF) IVPB 2g/100 mL premix        2 g 200 mL/hr over 30 Minutes Intravenous On call to O.R. 04/17/23 1113 04/17/23 1220     .  POD/HD#: 64  62 year old male s/p right hip resurfacing with right femoral neck stress fracture s/p conversion to right total hip arthroplasty  -Conversion right total hip arthroplasty for right femoral neck fracture s/p right hip resurfacing Weightbearing Weight-bear as tolerated right leg with walker or crutches and assistance   ROM/Activity   Posterior hip precautions for about 6 weeks    Wound care   Prevena to remain in place until follow-up with Dr. Blanchie Dessert   - Pain management:  Multimodal   Scheduled Tylenol and Robaxin   Oxycodone 5 to 10 mg po q6h prn breakthrough pain    PRN tramadol   PRN IV morphine for breakthrough  - ABL anemia/Hemodynamics  Stable, monitor. 10.7>10.0 Hbg today  - Medical issues   Per medicine  - DVT/PE prophylaxis:  ASA yesterday completed, Brilinta restarted today  - ID:   Perioperative antibiotics  Patient to discharge on cefadroxil 500 mg p.o. twice daily for 7 days at discharge for extended prophylaxis  - Metabolic Bone Disease:  Check vitamin D level - Activity:  As above - FEN/GI prophylaxis/Foley/Lines:  Regular diet  - Dispo:  CIR screen has been requested  Follow-up with Dr. Blanchie Dessert in 2 weeks  Janine Ores, PA-C

## 2023-04-19 NOTE — Progress Notes (Signed)
PROGRESS NOTE    Tyler James  ZOX:[id number] DOB: July 24, 1961 DOA: 04/14/2023 PCP: Benetta Spar, MD   Brief Narrative:  The patient is a 62 year old African-American male with a past medical history significant for but not limited to CAD status post stenting, ischemic cardiomyopathy, hypertension as well as other comorbidities who presented with progressively worsening right hip pain.  He reports that he had a hip replacement 2000 and 2010 and things were fine about 6 months ago but then he started developing right hip pain again which progressively got worse.  He went to [facility] last week given that the pain was a bad and x-rays were done and they gave him oral narcotics and told him to follow-up with orthopedics.  Call for an appointment and repeat x-rays in his hip was broken in the joint socket.  Given the pain he has been having trouble sleeping and recently had an MI in January and was planning to have the surgery of his hip but the MI delayed this plan.  He had no chest pain since January and he was a direct admission from orthopedic surgery given that he was seen and a fan on 04/04/2023 for hip pain with some "new angulation" in the hardware.  Given the concern for right hip periprosthetic fracture he was sent to the hospital and cardiology was consulted given his MI in January for clearance.  He was bridged and taken off of the Brilinta and placed on Cangrelor and continues to be on it.  Orthopedic surgery took the patient for surgical intervention on Friday, 04/17/2023 and is postoperative day 1.  PT recommending CIR    Assessment and Plan:   Peri-prosthetic R hip fracture -It appears that this has been present for some time, was previously planned for surgical repair but this was delayed by his MI in 11/2022 -He has had progressive gait instability and is currently minimally ambulatory -Based on recent CAD event, [facility] was asked to directly admit the patient to prepare for surgery on  5/31 -Cardiology consulted for clearance and see below -Cangrelor has been discontinued Brilinta to be started tomorrow -Orthopedics consulted and planning surgical intervention given that the patient has been developing progressively worsening symptoms secondary to a skilled hip resurfacing procedure and given the degree of pain and disability they have concerns that his immobility will put him at risk for bedsores, pneumonia, blood clots as well as generalized weakness and ultimately they feel like he would benefit from proceeding with a total hip arthroplasty which will be done on Friday afternoon (04/17/23) -Pain control with Scheduled Acetaminophen 650 g p.o. every 6h, Methacarbamol 500 mg 4x Daily Muscle Spasms; Continuing Tramadol 50 mg po q8hprn Moderate Pain, Oxycodone 5-10 mg po q6hprn Breakthrough Pain, and IV Morphine 2 mg q2hprn Severe Pain -TOC team consult for Rehab Placement  -Will need PT consult post-operatively and recommending CIR -Hip fracture order set utilized -TXA per orthopedics -Continue bowel regimen and has been initiated on docusate 100 mg p.o. twice daily, MiraLAX 17 g daily as needed for mild constipation and if necessary will change to senna docusate 1 tab p.o. twice daily; patient also has a bisacodyl 5 mg p.o. daily as needed for mild constipation -Orthopedic surgery recommending weightbearing as tolerated on right leg with walker or crutches and assistance and recommending posterior hip precautions about 6 weeks and continuing with Prevena to remain in place until follow-up with Dr. Blanchie Dessert -Orthopedic surgery has also added antibiotics and is on IV cefazolin  2 g every 8 hours for 3 days (Day 3/3) and recommending continuing cefadroxil 500 mg p.o. twice daily for 7 days at discharge for extended prophylaxis   CAD status post DES to OM2 and RCA in 2017 with left Circumflex in 2024 -Patient is on ASA and Brilinta prior to admission -Transitioned Brilinta to Cangrelor  peri-operatively given that heparin provides no benefit in this situation regarding his dual antiplatelet therapy -No CP since event in January -Cardiac clearance requested from Cardiology and regarding his surgical risk the patient does not have any unstable cardiac conditions and follow-up evaluation he can achieve 4 METS or greater without anginal symptoms and per cardiology his RCRI shows a 6.6% perioperative risk of major cardiac events and requires no further cardiac workup prior to his noncardiac surgery and they feel that he should be an acceptable risk -Cardiology recommends continuing aspirin 81 mg p.o. daily, atorvastatin 80 mg p.o. daily, metoprolol succinate 25 mg p.o. daily and now Ticagrelor is resumed at 90 mg po BID   HTN -Continue Losartan 25 mg po Daily and Metoprolol Succinate 25 mg po Daily -Continue to Monitor BP per Protocol  -Last BP reading was 127/57   HLD -Continue Atorvastatin 80 mg po Daily    Hyperglycemia in the setting of Pre-Diabetes -Glucose Level Trend: Recent Labs  Lab 04/14/23 1302 04/15/23 0418 04/16/23 0226 04/17/23 0314 04/18/23 0125 04/19/23 0154  GLUCOSE 113* 145* 101* 88 137* 133*  -Check HbA1c in the AM; Last one checked was 5.9 -Continue monitor CBGs per protocol and if necessary will place on sensitive NovoLog/scale insulin before meals and at bedtime   Tobacco Abuse -He is no longer smoking cigarettes but transitioned to cigars -Declines patch -Needs to stop smoking altogether will need continued smoking cessation counseling   Hypokalemia -Patient's K+ Level Trend: Recent Labs  Lab 04/14/23 1302 04/15/23 0418 04/16/23 0226 04/17/23 0314 04/18/23 0125 04/19/23 0154  K 3.4* 3.4* 4.3 3.9 4.0 3.8  -Continue to Monitor and Replete as Necessary -Repeat CMP in the AM     Microcytic Anemia -Hgb/Hct Trend: Recent Labs  Lab 04/14/23 1302 04/15/23 0418 04/16/23 0226 04/17/23 0314 04/18/23 0125 04/19/23 0154  HGB 12.0* 12.5*  12.6* 12.8* 10.7* 10.0*  HCT 37.5* 39.0 39.9 39.3 33.5* 30.6*  MCV 77.0* 76.8* 76.1* 76.3* 76.7* 76.7*  -Checked Anemia Panel and it showed an iron level of 37, UIBC 274, TIBC 311, saturation ratios of 12%, ferritin level of 73, folate level 9.3, vitamin B12 412 -Continue to Monitor for S/Sx of Bleeding as patient is is now back on ASA and Brilinita No overt bleeding noted -Repeat CBC in the AM    Hypoalbuminemia -Patient's Albumin Trend: Recent Labs  Lab 04/14/23 1302 04/16/23 0226 04/17/23 0314 04/18/23 0125 04/19/23 0154  ALBUMIN 3.3* 3.4* 3.4* 3.3* 3.2*  -Continue to Monitor and Trend and repeat CMP in the AM   DVT prophylaxis: SCDs Start: 04/14/23 1317    Code Status: Full Code Family Communication: No family present at bedside  Disposition Plan:  Level of care: Med-Surg Status is: Inpatient Remains inpatient appropriate because:  Needs further clinical improvement and clearance by orthopedic surgery and PT recommending CIR    Consultants:  Orthopedic Surgery  Procedures:  PROCEDURE:  Procedure(s): Conversion right hip resurfacing to total hip arthroplasty  Antimicrobials:  Anti-infectives (From admission, onward)    Start     Dose/Rate Route Frequency Ordered Stop   04/17/23 1630  ceFAZolin (ANCEF) IVPB 2g/100 mL premix  2 g 200 mL/hr over 30 Minutes Intravenous Every 8 hours 04/17/23 1542 04/20/23 1359   04/17/23 1115  ceFAZolin (ANCEF) IVPB 2g/100 mL premix        2 g 200 mL/hr over 30 Minutes Intravenous On call to O.R. 04/17/23 1113 04/17/23 1220       Subjective: Seen and examined at bedside and thinks he is doing better.  Hoping to go to rehab soon states that he lives in [place].  No nausea or vomiting.  Had a small bowel movement.  Denies any other concerns or complaints at this time.  Objective: Vitals:   04/18/23 1425 04/18/23 2114 04/19/23 0429 04/19/23 0846  BP: 134/77 (!) 119/55 130/68 (!) 127/57  Pulse: 73 71 72 68  Resp: 16 16 16  18   Temp: (!) 97.5 F (36.4 C) 99 F (37.2 C)  98 F (36.7 C)  TempSrc: Oral Oral    SpO2: 100% 100% 100% 99%  Weight:      Height:        Intake/Output Summary (Last 24 hours) at 04/19/2023 1149 Last data filed at 04/19/2023 0900 Gross per 24 hour  Intake 630 ml  Output 1850 ml  Net -1220 ml   Filed Weights   04/14/23 1137  Weight: 74.8 kg   Examination: Physical Exam:  Constitutional: Thin African-American male in no acute distress appears calm  Respiratory: Diminished to auscultation bilaterally, no wheezing, rales, rhonchi or crackles. Normal respiratory effort and patient is not tachypenic. No accessory muscle use.  Unlabored breathing Cardiovascular: RRR, no murmurs / rubs / gallops. S1 and S2 auscultated. No extremity edema.  Abdomen: Soft, non-tender, non-distended. Bowel sounds positive.  GU: Deferred. Musculoskeletal: No clubbing / cyanosis of digits/nails. No joint deformity upper and lower extremities. Skin: No rashes, lesions, ulcers on limited skin evaluation. No induration; Warm and dry.  Neurologic: CN 2-12 grossly intact with no focal deficits.  Romberg sign and cerebellar reflexes not assessed.  Psychiatric: Normal judgment and insight. Alert and oriented x 3. Normal mood and appropriate affect.   Data Reviewed: I have personally reviewed following labs and imaging studies  CBC: Recent Labs  Lab 04/14/23 1302 04/15/23 0418 04/16/23 0226 04/17/23 0314 04/18/23 0125 04/19/23 0154  WBC 4.9 5.4 5.1 5.4 9.9 7.9  NEUTROABS 3.2  --  2.5 2.9 7.9* 5.1  HGB 12.0* 12.5* 12.6* 12.8* 10.7* 10.0*  HCT 37.5* 39.0 39.9 39.3 33.5* 30.6*  MCV 77.0* 76.8* 76.1* 76.3* 76.7* 76.7*  PLT 228 227 211 224 212 183   Basic Metabolic Panel: Recent Labs  Lab 04/15/23 0418 04/16/23 0226 04/17/23 0314 04/18/23 0125 04/19/23 0154  NA 137 141 137 135 137  K 3.4* 4.3 3.9 4.0 3.8  CL 106 106 104 103 105  CO2 22 23 23 22 26   GLUCOSE 145* 101* 88 137* 133*  BUN 13 16 16 19  15   CREATININE 0.98 0.93 0.95 1.21 1.11  CALCIUM 8.7* 9.3 9.0 8.8* 8.7*  MG 1.9 2.1 2.0 1.8 2.1  PHOS 3.5 4.1 4.2 3.1 3.0   GFR: Estimated Creatinine Clearance: 67.2 mL/min (by C-G formula based on SCr of 1.11 mg/dL). Liver Function Tests: Recent Labs  Lab 04/14/23 1302 04/16/23 0226 04/17/23 0314 04/18/23 0125 04/19/23 0154  AST 27 30 32 31 29  ALT 32 35 34 31 24  ALKPHOS 59 62 64 51 53  BILITOT 0.8 0.5 0.5 0.6 0.8  PROT 6.1* 6.0* 6.3* 5.9* 6.0*  ALBUMIN 3.3* 3.4* 3.4* 3.3* 3.2*  No results for input(s): "LIPASE", "AMYLASE" in the last 168 hours. No results for input(s): "AMMONIA" in the last 168 hours. Coagulation Profile: No results for input(s): "INR", "PROTIME" in the last 168 hours. Cardiac Enzymes: No results for input(s): "CKTOTAL", "CKMB", "CKMBINDEX", "TROPONINI" in the last 168 hours. BNP (last 3 results) No results for input(s): "PROBNP" in the last 8760 hours. HbA1C: No results for input(s): "HGBA1C" in the last 72 hours. CBG: No results for input(s): "GLUCAP" in the last 168 hours. Lipid Profile: No results for input(s): "CHOL", "HDL", "LDLCALC", "TRIG", "CHOLHDL", "LDLDIRECT" in the last 72 hours. Thyroid Function Tests: No results for input(s): "TSH", "T4TOTAL", "FREET4", "T3FREE", "THYROIDAB" in the last 72 hours. Anemia Panel: No results for input(s): "VITAMINB12", "FOLATE", "FERRITIN", "TIBC", "IRON", "RETICCTPCT" in the last 72 hours. Sepsis Labs: No results for input(s): "PROCALCITON", "LATICACIDVEN" in the last 168 hours.  Recent Results (from the past 240 hour(s))  Surgical pcr screen     Status: None   Collection Time: 04/17/23  7:16 AM   Specimen: Nasal Mucosa; Nasal Swab  Result Value Ref Range Status   MRSA, PCR NEGATIVE NEGATIVE Final   Staphylococcus aureus NEGATIVE NEGATIVE Final    Comment: (NOTE) The Xpert SA Assay (FDA approved for NASAL specimens in patients 41 years of age and older), is one component of a  comprehensive surveillance program. It is not intended to diagnose infection nor to guide or monitor treatment. Performed at [facility], [address] [place], [place] [address]   Aerobic/Anaerobic Culture w Gram Stain (surgical/deep wound)     Status: None (Preliminary result)   Collection Time: 04/17/23  1:45 PM   Specimen: Soft Tissue, Other  Result Value Ref Range Status   Specimen Description TISSUE RIGHT HIP  Final   Special Requests NONE  Final   Gram Stain NO WBC SEEN NO ORGANISMS SEEN   Final   Culture   Final    NO GROWTH < 24 HOURS Performed at [facility], [address], [place], [place] [address]    Report Status PENDING  Incomplete   Radiology Studies: DG HIP UNILAT W OR W/O PELVIS 2-3 VIEWS RIGHT  Result Date: 04/17/2023 CLINICAL DATA:  [id number] Post-operative state [id number] EXAM: DG HIP (WITH OR WITHOUT PELVIS) 2-3V RIGHT COMPARISON:  04/04/2023 FINDINGS: Postsurgical changes of right hip arthroplasty. Normal alignment. No evidence of loosening or periprosthetic fracture. Expected soft tissue changes. Unchanged left femoral head arthroplasty. IMPRESSION: Postsurgical changes of right hip arthroplasty. Normal alignment. No evidence of immediate hardware complication. Electronically Signed   By: Caprice Renshaw M.D.   On: 04/17/2023 16:38   DG HIP UNILAT WITH PELVIS 1V RIGHT  Result Date: 04/17/2023 CLINICAL DATA:  Elective surgery EXAM: DG HIP (WITH OR WITHOUT PELVIS) 1V RIGHT COMPARISON:  Preoperative exam 04/04/2023 FINDINGS: Two fluoroscopic spot views of the right hip obtained in the operating room. Removal of previous right hip hardware with interval hip arthroplasty. Fluoroscopy time 4.4 seconds. Dose 0.24 mGy. IMPRESSION: Intraoperative fluoroscopy during right hip arthroplasty. Electronically Signed   By: Narda Rutherford M.D.   On: 04/17/2023 14:59   DG C-Arm 1-60 Min-No Report  Result Date: 04/17/2023 Fluoroscopy was utilized by the requesting  physician.  No radiographic interpretation.   DG C-Arm 1-60 Min-No Report  Result Date: 04/17/2023 Fluoroscopy was utilized by the requesting physician.  No radiographic interpretation.    Scheduled Meds:  acetaminophen  650 mg Oral Q6H   atorvastatin  80 mg Oral Daily  docusate sodium  100 mg Oral BID   losartan  25 mg Oral Daily   methocarbamol  500 mg Oral QID   metoprolol succinate  25 mg Oral Daily   multivitamin with minerals  1 tablet Oral Daily   ticagrelor  90 mg Oral BID   Continuous Infusions:   ceFAZolin (ANCEF) IV 2 g (04/19/23 0553)   methocarbamol (ROBAXIN) IV      LOS: 5 days   Marguerita Merles, DO [facility] Available via Epic secure chat 7am-7pm After these hours, please refer to coverage provider listed on [email or web address] 04/19/2023, 11:49 AM

## 2023-04-20 ENCOUNTER — Emergency Department (HOSPITAL_COMMUNITY)
Admission: EM | Admit: 2023-04-20 | Discharge: 2023-04-20 | Disposition: A | Discharge status: Home / Self Care | Payer: 59 | Attending: Emergency Medicine | Admitting: Emergency Medicine

## 2023-04-20 ENCOUNTER — Other Ambulatory Visit: Payer: Self-pay

## 2023-04-20 ENCOUNTER — Encounter (HOSPITAL_COMMUNITY): Payer: Self-pay | Admitting: Orthopedic Surgery

## 2023-04-20 DIAGNOSIS — E782 Mixed hyperlipidemia: Secondary | ICD-10-CM | POA: Diagnosis not present

## 2023-04-20 DIAGNOSIS — Z4801 Encounter for change or removal of surgical wound dressing: Secondary | ICD-10-CM | POA: Diagnosis not present

## 2023-04-20 DIAGNOSIS — I10 Essential (primary) hypertension: Secondary | ICD-10-CM | POA: Diagnosis not present

## 2023-04-20 DIAGNOSIS — Z4802 Encounter for removal of sutures: Secondary | ICD-10-CM | POA: Diagnosis not present

## 2023-04-20 DIAGNOSIS — I2510 Atherosclerotic heart disease of native coronary artery without angina pectoris: Secondary | ICD-10-CM | POA: Diagnosis not present

## 2023-04-20 DIAGNOSIS — Z79899 Other long term (current) drug therapy: Secondary | ICD-10-CM | POA: Diagnosis not present

## 2023-04-20 DIAGNOSIS — Z7902 Long term (current) use of antithrombotics/antiplatelets: Secondary | ICD-10-CM | POA: Insufficient documentation

## 2023-04-20 DIAGNOSIS — Z5189 Encounter for other specified aftercare: Secondary | ICD-10-CM

## 2023-04-20 DIAGNOSIS — M978XXS Periprosthetic fracture around other internal prosthetic joint, sequela: Secondary | ICD-10-CM | POA: Diagnosis not present

## 2023-04-20 LAB — CBC WITH DIFFERENTIAL/PLATELET
Abs Immature Granulocytes: 0.02 10*3/uL (ref 0.00–0.07)
Basophils Absolute: 0.1 10*3/uL (ref 0.0–0.1)
Basophils Relative: 1 %
Eosinophils Absolute: 0.2 10*3/uL (ref 0.0–0.5)
Eosinophils Relative: 2 %
HCT: 32.1 % — ABNORMAL LOW (ref 39.0–52.0)
Hemoglobin: 10.2 g/dL — ABNORMAL LOW (ref 13.0–17.0)
Immature Granulocytes: 0 %
Lymphocytes Relative: 20 %
Lymphs Abs: 1.7 10*3/uL (ref 0.7–4.0)
MCH: 24.2 pg — ABNORMAL LOW (ref 26.0–34.0)
MCHC: 31.8 g/dL (ref 30.0–36.0)
MCV: 76.2 fL — ABNORMAL LOW (ref 80.0–100.0)
Monocytes Absolute: 1.1 10*3/uL — ABNORMAL HIGH (ref 0.1–1.0)
Monocytes Relative: 12 %
Neutro Abs: 5.7 10*3/uL (ref 1.7–7.7)
Neutrophils Relative %: 65 %
Platelets: 211 10*3/uL (ref 150–400)
RBC: 4.21 MIL/uL — ABNORMAL LOW (ref 4.22–5.81)
RDW: 14.3 % (ref 11.5–15.5)
WBC: 8.7 10*3/uL (ref 4.0–10.5)
nRBC: 0 % (ref 0.0–0.2)

## 2023-04-20 LAB — COMPREHENSIVE METABOLIC PANEL
ALT: 25 U/L (ref 0–44)
AST: 32 U/L (ref 15–41)
Albumin: 3.1 g/dL — ABNORMAL LOW (ref 3.5–5.0)
Alkaline Phosphatase: 51 U/L (ref 38–126)
Anion gap: 10 (ref 5–15)
BUN: 12 mg/dL (ref 8–23)
CO2: 25 mmol/L (ref 22–32)
Calcium: 8.8 mg/dL — ABNORMAL LOW (ref 8.9–10.3)
Chloride: 102 mmol/L (ref 98–111)
Creatinine, Ser: 0.95 mg/dL (ref 0.61–1.24)
GFR, Estimated: >60 mL/min (ref >60)
Glucose, Bld: 98 mg/dL (ref 70–99)
Potassium: 4.4 mmol/L (ref 3.5–5.1)
Sodium: 137 mmol/L (ref 135–145)
Total Bilirubin: 0.9 mg/dL (ref 0.3–1.2)
Total Protein: 6.3 g/dL — ABNORMAL LOW (ref 6.5–8.1)

## 2023-04-20 LAB — MAGNESIUM: Magnesium: 2.2 mg/dL (ref 1.7–2.4)

## 2023-04-20 LAB — PHOSPHORUS: Phosphorus: 3 mg/dL (ref 2.5–4.6)

## 2023-04-20 MED ORDER — DOCUSATE SODIUM 100 MG PO CAPS: 100.0000 mg | ORAL_CAPSULE | Freq: Two times a day (BID) | ORAL | 0 refills | Status: AC

## 2023-04-20 MED ORDER — POLYETHYLENE GLYCOL 3350 17 G PO PACK: 17.0000 g | PACK | Freq: Every day | ORAL | 0 refills | Status: DC | PRN

## 2023-04-20 MED ORDER — CEFADROXIL 500 MG PO CAPS: 500.0000 mg | ORAL_CAPSULE | Freq: Two times a day (BID) | ORAL | 0 refills | Status: AC

## 2023-04-20 MED ORDER — ADULT MULTIVITAMIN W/MINERALS CH: 1.0000 | ORAL_TABLET | Freq: Every day | ORAL | 0 refills | Status: AC

## 2023-04-20 NOTE — Progress Notes (Signed)
Discharge instructions given. Patient verbalized understanding and all questions were answered.  ?

## 2023-04-20 NOTE — Evaluation (Signed)
Occupational Therapy Evaluation Patient Details Name: Tyler James MRN: [id number] DOB: [birth year] Today's Date: 04/20/2023   History of Present Illness 62 y.o. male admitted 5/28 with Peri-prosthetic R hip fracture, s/p Conversion right hip resurfacing to total hip arthroplasty on 5/31. PMHx: CAD status post stenting, ischemic cardiomyopathy, hypertension.   Clinical Impression   Patient admitted for the diagnosis above.  PTA he lived alone, SO is there throughout the day, and needed no assist with ADL, iADL or mobility.  Continued appropriate post op discomfort, needing supervision for in room mobility, and Min A for lower body ADL.  Patient is not interested in hip kit, stating his SO will assist.  No further OT needs in the acute setting.        Recommendations for follow up therapy are one component of a multi-disciplinary discharge planning process, led by the attending physician.  Recommendations may be updated based on patient status, additional functional criteria and insurance authorization.   Assistance Recommended at Discharge Intermittent Supervision/Assistance  Patient can return home with the following Assist for transportation;Assistance with cooking/housework;A little help with bathing/dressing/bathroom    Functional Status Assessment  Patient has had a recent decline in their functional status and demonstrates the ability to make significant improvements in function in a reasonable and predictable amount of time.  Equipment Recommendations  None recommended by OT    Recommendations for Other Services       Precautions / Restrictions Precautions Precautions: Posterior Hip Restrictions Weight Bearing Restrictions: Yes RLE Weight Bearing: Weight bearing as tolerated      Mobility Bed Mobility               General bed mobility comments: sitting EOB    Transfers Overall transfer level: Needs assistance Equipment used: Rolling walker (2 wheels) Transfers:  Sit to/from Stand Sit to Stand: Supervision, Min guard                  Balance Overall balance assessment: Needs assistance Sitting-balance support: Single extremity supported, Feet supported Sitting balance-Leahy Scale: Good     Standing balance support: Bilateral upper extremity supported Standing balance-Leahy Scale: Fair                             ADL either performed or assessed with clinical judgement   ADL                   Upper Body Dressing : Set up;Sitting;Standing   Lower Body Dressing: Minimal assistance;Sit to/from stand;With adaptive equipment   Toilet Transfer: Supervision/safety                   Vision Patient Visual Report: No change from baseline       Perception     Praxis      Pertinent Vitals/Pain Pain Assessment Pain Assessment: Faces Faces Pain Scale: Hurts a little bit Pain Location: Rt hip Pain Descriptors / Indicators: Tender Pain Intervention(s): Monitored during session     Hand Dominance Right   Extremity/Trunk Assessment Upper Extremity Assessment Upper Extremity Assessment: Overall WFL for tasks assessed   Lower Extremity Assessment Lower Extremity Assessment: Defer to PT evaluation   Cervical / Trunk Assessment Cervical / Trunk Assessment: Kyphotic   Communication Communication Communication: No difficulties   Cognition Arousal/Alertness: Awake/alert Behavior During Therapy: WFL for tasks assessed/performed Overall Cognitive Status: Within Functional Limits for tasks assessed  Home Living Family/patient expects to be discharged to:: Private residence Living Arrangements: Non-relatives/Friends Available Help at Discharge: Available 24 hours/day;Friend(s) Type of Home: House Home Access: Ramped entrance     Home Layout: One level     Bathroom Shower/Tub: Chief Strategy Officer:  Standard Bathroom Accessibility: Yes How Accessible: Accessible via walker Home Equipment: Crutches;Shower seat          Prior Functioning/Environment Prior Level of Function : Independent/Modified Independent               ADLs Comments: Independent        OT Problem List: Decreased strength;Impaired balance (sitting and/or standing);Pain      OT Treatment/Interventions:      OT Goals(Current goals can be found in the care plan section) Acute Rehab OT Goals Patient Stated Goal: Go home today OT Goal Formulation: With patient Time For Goal Achievement: 04/24/23 Potential to Achieve Goals: Good  OT Frequency:      Co-evaluation              AM-PAC OT "6 Clicks" Daily Activity     Outcome Measure Help from another person eating meals?: None Help from another person taking care of personal grooming?: A Little Help from another person toileting, which includes using toliet, bedpan, or urinal?: A Little Help from another person bathing (including washing, rinsing, drying)?: A Lot Help from another person to put on and taking off regular upper body clothing?: None Help from another person to put on and taking off regular lower body clothing?: A Lot 6 Click Score: 18   End of Session Equipment Utilized During Treatment: Rolling walker (2 wheels) Nurse Communication: Mobility status  Activity Tolerance: Patient tolerated treatment well Patient left: in bed;with call bell/phone within reach  OT Visit Diagnosis: Unsteadiness on feet (R26.81);Pain Pain - Right/Left: Left Pain - part of body: Hip;Leg                Time: 1100-1119 OT Time Calculation (min): 19 min Charges:  OT General Charges $OT Visit: 1 Visit OT Evaluation $OT Eval Moderate Complexity: 1 Mod  04/20/2023  RP, OTR/L  Acute Rehabilitation Services  Office:  [phone]   Suzanna Obey 04/20/2023, 11:24 AM

## 2023-04-20 NOTE — Discharge Instructions (Signed)
Watch for increased bleeding from the site.  Call the surgeon tomorrow for more urgent follow-up if needed.

## 2023-04-20 NOTE — Plan of Care (Signed)
  Problem: Education: Goal: Knowledge of General Education information will improve Description: Including pain rating scale, medication(s)/side effects and non-pharmacologic comfort measures Outcome: Progressing   Problem: Activity: Goal: Risk for activity intolerance will decrease 04/20/2023 0311 by Geralynn Ochs, RN Outcome: Progressing 04/20/2023 0310 by Geralynn Ochs, RN Outcome: Progressing   Problem: Nutrition: Goal: Adequate nutrition will be maintained Outcome: Progressing   Problem: Coping: Goal: Level of anxiety will decrease 04/20/2023 0311 by Geralynn Ochs, RN Outcome: Progressing 04/20/2023 0310 by Geralynn Ochs, RN Outcome: Progressing

## 2023-04-20 NOTE — ED Provider Notes (Signed)
[facility] EMERGENCY DE[facility]MENT AT [facility] Provider Note   CSN: [id number] Arrival date & time: 04/20/23  2044     History  Chief Complaint  Patient presents with   Post-op Problem    Tyler James is a 62 y.o. male.  HPI Patient presents after being released today after hip surgery.  States had wound VAC on at the hospital but then removed due to no output.  Now states that has had some blood.  States seems more swollen.  No lightheadedness or dizziness.  Is on blood thinner.   Past Medical History:  Diagnosis Date   CAD (coronary artery disease)    a. 02/2016: STEMI - 100% stenosis OM2 w/ DES placed, POBA to LCx, and staged PCI of 95% stenosed RCA w/ DES.  Also in 11/2022   Hypertension    Ischemic cardiomyopathy    a. 02/2016: EF 45-50% w/ diffuse inferolateral hypokinesis    Home Medications Prior to Admission medications   Medication Sig Start Date End Date Taking? Authorizing Provider  acetaminophen (TYLENOL) 500 MG tablet Take 2 tablets (1,000 mg total) by mouth every 8 (eight) hours as needed. 04/17/23 05/17/23  Cecil Cobbs, PA-C  atorvastatin (LIPITOR) 80 MG tablet Take 1 tablet (80 mg total) by mouth daily. 11/28/22   Arty Baumgartner, NP  cefadroxil (DURICEF) 500 MG capsule Take 1 capsule (500 mg total) by mouth 2 (two) times daily for 7 days. 04/20/23 04/27/23  Marguerita Merles Latif, DO  diphenhydramine-acetaminophen (TYLENOL PM) 25-500 MG TABS tablet Take 1 tablet by mouth at bedtime as needed (Pain).    [provider]  docusate sodium (COLACE) 100 MG capsule Take 1 capsule (100 mg total) by mouth 2 (two) times daily. 04/20/23   Marguerita Merles Latif, DO  losartan (COZAAR) 25 MG tablet Take 25 mg by mouth daily.    [provider]  methocarbamol (ROBAXIN) 500 MG tablet Take 1 tablet (500 mg total) by mouth every 8 (eight) hours as needed for up to 10 days for muscle spasms. 04/17/23 04/27/23  Cecil Cobbs, PA-C  metoprolol succinate  (TOPROL XL) 25 MG 24 hr tablet Take 1 tablet (25 mg total) by mouth daily. 10/24/22   Sharlene Dory, PA-C  Multiple Vitamin (MULTIVITAMIN WITH MINERALS) TABS tablet Take 1 tablet by mouth daily. 04/21/23   Marguerita Merles Latif, DO  nitroGLYCERIN (NITROSTAT) 0.4 MG SL tablet Place 1 tablet (0.4 mg total) under the tongue every 5 (five) minutes as needed for chest pain. 12/04/22   Sharlene Dory, NP  ondansetron (ZOFRAN) 4 MG tablet Take 1 tablet (4 mg total) by mouth every 8 (eight) hours as needed for up to 14 days for nausea or vomiting. 04/17/23 05/01/23  Cecil Cobbs, PA-C  oxyCODONE (ROXICODONE) 5 MG immediate release tablet Take 1 tablet (5 mg total) by mouth every 4 (four) hours as needed for up to 7 days for severe pain or moderate pain. 04/17/23 04/24/23  Cecil Cobbs, PA-C  polyethylene glycol (MIRALAX / GLYCOLAX) 17 g packet Take 17 g by mouth daily as needed for mild constipation. 04/20/23   Marguerita Merles Latif, DO  ticagrelor (BRILINTA) 90 MG TABS tablet Take 1 tablet (90 mg total) by mouth 2 (two) times daily. 02/24/23   Antoine Poche, MD      Allergies    Ibuprofen    Review of Systems   Review of Systems  Physical Exam Updated Vital Signs BP (!) 143/78 (BP Location:  Left Arm)   Pulse 96   Temp 99.5 F (37.5 C) (Oral)   Resp 15   Ht 5\' 9"  (1.753 m)   Wt 74.8 kg   SpO2 100%   BMI 24.37 kg/m  Physical Exam Vitals reviewed.  Cardiovascular:     Rate and Rhythm: Regular rhythm.  Musculoskeletal:     Comments: Dressing on wound on right hip.  Does have some mild bleeding under the dressing.  Able to squeeze a little purpleish blood out of the top with the mild loosening area of the bandage.  Neurological:     Mental Status: He is alert.     ED Results / Procedures / Treatments   Labs (all labs ordered are listed, but only abnormal results are displayed) Labs Reviewed - No data to display  EKG None  Radiology No results found.  Procedures Procedures     Medications Ordered in ED Medications - No data to display  ED Course/ Medical Decision Making/ A&P                             Medical Decision Making  Mild postoperative bleeding from right hip.  Some mild amount of bleeding.  Well-appearing.  Do not think we need more blood work.  Is on blood thinner.  Instructed patient to call Ortho tomorrow.  Follow-up was scheduled for a week from tomorrow but may want to get seen sooner.  Doubt infection.  Appears stable for discharge home.  I reviewed discharge note.        Final Clinical Impression(s) / ED Diagnoses Final diagnoses:  Visit for wound check    Rx / DC Orders ED Discharge Orders     None         Benjiman Core, MD 04/20/23 2132

## 2023-04-20 NOTE — Discharge Summary (Addendum)
Physician Discharge Summary   Patient: Tyler James MRN: [id number] DOB: [birth year]  Admit date:     04/14/2023  Discharge date: 04/20/23  Discharge Physician: Marguerita Merles, DO   PCP: Benetta Spar, MD   Recommendations at discharge:    Follow up with PCP within 1-2 weeks and repeat CBC, CMP, Mag, Phos within 1 week Follow-up with orthopedic surgery within 1 to 2 weeks and continue weightbearing as tolerated right leg with walker or crutches Follow-up with cardiology in outpatient setting  Continue with Po Cefadroxil 500 mg po BID  Discharge Diagnoses: Principal Problem:   Periprosthetic hip fracture, sequela Active Problems:   Tobacco abuse   Hypertension   Hyperlipidemia   CAD S/P percutaneous coronary angioplasty  Resolved Problems:   * No resolved hospital problems. [facility] Course: The patient is a 62 year old African-American male with a past medical history significant for but not limited to CAD status post stenting, ischemic cardiomyopathy, hypertension as well as other comorbidities who presented with progressively worsening right hip pain.  He reports that he had a hip replacement 2000 and 2010 and things were fine about 6 months ago but then he started developing right hip pain again which progressively got worse.  He went to [facility] last week given that the pain was a bad and x-rays were done and they gave him oral narcotics and told him to follow-up with orthopedics.  Call for an appointment and repeat x-rays in his hip was broken in the joint socket.  Given the pain he has been having trouble sleeping and recently had an MI in January and was planning to have the surgery of his hip but the MI delayed this plan.  He had no chest pain since January and he was a direct admission from orthopedic surgery given that he was seen and a fan on 04/04/2023 for hip pain with some "new angulation" in the hardware.  Given the concern for right hip periprosthetic fracture he  was sent to the hospital and cardiology was consulted given his MI in January for clearance.  He was bridged and taken off of the Brilinta and placed on Cangrelor and continues to be on it.  Orthopedic surgery took the patient for surgical intervention on Friday, 04/17/2023 and is postoperative day 3.  PT recommending CIR and TOC assisting with Discharge Disposition but now recommendations have changed to Home Health PT/OT given his improvement.     Assessment and Plan:   Peri-prosthetic R hip fracture -It appears that this has been present for some time, was previously planned for surgical repair but this was delayed by his MI in 11/2022 -He has had progressive gait instability and is currently minimally ambulatory -Based on recent CAD event, TRH was asked to directly admit the patient to prepare for surgery on 5/31 -Cardiology consulted for clearance and see below -Cangrelor has been discontinued Brilinta to be started tomorrow -Orthopedics consulted and planning surgical intervention given that the patient has been developing progressively worsening symptoms secondary to a skilled hip resurfacing procedure and given the degree of pain and disability they have concerns that his immobility will put him at risk for bedsores, pneumonia, blood clots as well as generalized weakness and ultimately they feel like he would benefit from proceeding with a total hip arthroplasty which will be done on Friday afternoon (04/17/23) -Pain control with Scheduled Acetaminophen 650 g p.o. every 6h, Methacarbamol 500 mg 4x Daily Muscle Spasms; Continuing Tramadol 50 mg po q8hprn Moderate  Pain, Oxycodone 5-10 mg po q6hprn Breakthrough Pain, and IV Morphine 2 mg q2hprn Severe Pain -TOC team consult for Rehab Placement  -Will need PT consult post-operatively and recommending CIR but now changed recommendation to Home Health -Hip fracture order set utilized -TXA per orthopedics -Continue bowel regimen and has been initiated  on docusate 100 mg p.o. twice daily, MiraLAX 17 g daily as needed for mild constipation and if necessary will change to senna docusate 1 tab p.o. twice daily; patient also has a bisacodyl 5 mg p.o. daily as needed for mild constipation -Orthopedic surgery recommending weightbearing as tolerated on right leg with walker or crutches and assistance and recommending posterior hip precautions about 6 weeks and continuing with Prevena to remain in place until follow-up with Dr. Blanchie Dessert -Orthopedic surgery has also added antibiotics and is on IV cefazolin 2 g every 8 hours for 3 days (Day 3/3) and recommending continuing cefadroxil 500 mg p.o. twice daily for 7 days at discharge for extended prophylaxis   CAD status post DES to OM2 and RCA in 2017 with left Circumflex in 2024 -Patient is on ASA and Brilinta prior to admission -Transitioned Brilinta to Cangrelor peri-operatively given that heparin provides no benefit in this situation regarding his dual antiplatelet therapy -No CP since event in January -Cardiac clearance requested from Cardiology and regarding his surgical risk the patient does not have any unstable cardiac conditions and follow-up evaluation he can achieve 4 METS or greater without anginal symptoms and per cardiology his RCRI shows a 6.6% perioperative risk of major cardiac events and requires no further cardiac workup prior to his noncardiac surgery and they feel that he should be an acceptable risk -Cardiology recommends continuing aspirin 81 mg p.o. daily, atorvastatin 80 mg p.o. daily, metoprolol succinate 25 mg p.o. daily and now Ticagrelor is resumed at 90 mg po BID   HTN -Continue Losartan 25 mg po Daily and Metoprolol Succinate 25 mg po Daily -Continue to Monitor BP per Protocol  -Last BP reading was 120/63   HLD -Continue Atorvastatin 80 mg po Daily    Hyperglycemia in the setting of Pre-Diabetes -Glucose Level Trend: Recent Labs  Lab 04/14/23 1302 04/15/23 0418  04/16/23 0226 04/17/23 0314 04/18/23 0125 04/19/23 0154 04/20/23 0312  GLUCOSE 113* 145* 101* 88 137* 133* 98  -Check HbA1c in the AM; Last one checked was 5.9 -Continue monitor CBGs per protocol and if necessary will place on sensitive NovoLog/scale insulin before meals and at bedtime   Tobacco Abuse -He is no longer smoking cigarettes but transitioned to cigars -Declines patch -Needs to stop smoking altogether will need continued smoking cessation counseling   Hypokalemia -Patient's K+ Level Trend: Recent Labs  Lab 04/14/23 1302 04/15/23 0418 04/16/23 0226 04/17/23 0314 04/18/23 0125 04/19/23 0154 04/20/23 0312  K 3.4* 3.4* 4.3 3.9 4.0 3.8 4.4  -Continue to Monitor and Replete as Necessary -Repeat CMP in the AM     Microcytic Anemia -Hgb/Hct Trend: Recent Labs  Lab 04/14/23 1302 04/15/23 0418 04/16/23 0226 04/17/23 0314 04/18/23 0125 04/19/23 0154 04/20/23 0312  HGB 12.0* 12.5* 12.6* 12.8* 10.7* 10.0* 10.2*  HCT 37.5* 39.0 39.9 39.3 33.5* 30.6* 32.1*  MCV 77.0* 76.8* 76.1* 76.3* 76.7* 76.7* 76.2*  -Checked Anemia Panel and it showed an iron level of 37, UIBC 274, TIBC 311, saturation ratios of 12%, ferritin level of 73, folate level 9.3, vitamin B12 412 -Continue to Monitor for S/Sx of Bleeding as patient is is now back on ASA and Brilinita No  overt bleeding noted -Repeat CBC in the AM    Hypoalbuminemia -Patient's Albumin Trend: Recent Labs  Lab 04/14/23 1302 04/16/23 0226 04/17/23 0314 04/18/23 0125 04/19/23 0154 04/20/23 0312  ALBUMIN 3.3* 3.4* 3.4* 3.3* 3.2* 3.1*  -Continue to Monitor and Trend and repeat CMP in the AM  Nutrition Documentation    Flowsheet Row Admission (Current) from 04/14/2023 in [facility]  Nutrition Problem Increased nutrient needs  Etiology hip fracture  Nutrition Goal Patient will meet greater than or equal to 90% of their needs  Interventions Refer to RD note for recommendations       Consultants: Orthopedic Surgery, Cardiology Procedures performed: As delineated as above  Disposition: Home health Diet recommendation:  Cardiac diet DISCHARGE MEDICATION: Allergies as of 04/20/2023       Reactions   Ibuprofen Swelling   Swelling of face        Medication List     STOP taking these medications    aspirin 81 MG chewable tablet   traMADol 50 MG tablet Commonly known as: ULTRAM       TAKE these medications    acetaminophen 500 MG tablet Commonly known as: TYLENOL Take 2 tablets (1,000 mg total) by mouth every 8 (eight) hours as needed.   atorvastatin 80 MG tablet Commonly known as: LIPITOR Take 1 tablet (80 mg total) by mouth daily.   cefadroxil 500 MG capsule Commonly known as: DURICEF Take 1 capsule (500 mg total) by mouth 2 (two) times daily for 7 days.   diphenhydramine-acetaminophen 25-500 MG Tabs tablet Commonly known as: TYLENOL PM Take 1 tablet by mouth at bedtime as needed (Pain).   docusate sodium 100 MG capsule Commonly known as: COLACE Take 1 capsule (100 mg total) by mouth 2 (two) times daily.   losartan 25 MG tablet Commonly known as: COZAAR Take 25 mg by mouth daily.   methocarbamol 500 MG tablet Commonly known as: ROBAXIN Take 1 tablet (500 mg total) by mouth every 8 (eight) hours as needed for up to 10 days for muscle spasms.   metoprolol succinate 25 MG 24 hr tablet Commonly known as: Toprol XL Take 1 tablet (25 mg total) by mouth daily.   multivitamin with minerals Tabs tablet Take 1 tablet by mouth daily. Start taking on: April 21, 2023   nitroGLYCERIN 0.4 MG SL tablet Commonly known as: NITROSTAT Place 1 tablet (0.4 mg total) under the tongue every 5 (five) minutes as needed for chest pain.   ondansetron 4 MG tablet Commonly known as: Zofran Take 1 tablet (4 mg total) by mouth every 8 (eight) hours as needed for up to 14 days for nausea or vomiting.   oxyCODONE 5 MG immediate release tablet Commonly known  as: Roxicodone Take 1 tablet (5 mg total) by mouth every 4 (four) hours as needed for up to 7 days for severe pain or moderate pain.   polyethylene glycol 17 g packet Commonly known as: MIRALAX / GLYCOLAX Take 17 g by mouth daily as needed for mild constipation.   ticagrelor 90 MG Tabs tablet Commonly known as: BRILINTA Take 1 tablet (90 mg total) by mouth 2 (two) times daily.               Discharge Care Instructions  (From admission, onward)           Start     Ordered   04/20/23 0000  Discharge wound care:       Comments: Per Orthopedic  Surgery and will need wound Vac   04/20/23 1118            Follow-up Information     [name], MD Follow up in 2 week(s).   Specialty: Orthopedic Surgery Contact information: [address] [place] [address] [place] [address] [phone]                Discharge Exam: Ceasar Mons Weights   04/14/23 1137  Weight: 74.8 kg   Vitals:   04/20/23 0537 04/20/23 0723  BP: 123/67 120/63  Pulse: 76 76  Resp: 18 18  Temp: 98.7 F (37.1 C) 98.8 F (37.1 C)  SpO2: 100% 100%   Examination: Physical Exam:  Constitutional: Thin African-American male in no acute distress appears calm Respiratory: Diminished to auscultation bilaterally, no wheezing, rales, rhonchi or crackles. Normal respiratory effort and patient is not tachypenic. No accessory muscle use.  Unlabored breathing Cardiovascular: RRR, no murmurs / rubs / gallops. S1 and S2 auscultated. No extremity edema. Abdomen: Soft, non-tender, non-distended. Bowel sounds positive.  GU: Deferred. Musculoskeletal: No clubbing / cyanosis of digits/nails. No joint deformity upper and lower extremities.  Skin: No rashes, lesions, ulcers on limited skin evaluation. No induration; Warm and dry.  Neurologic: CN 2-12 grossly intact with no focal deficits. Romberg sign and cerebellar reflexes not assessed.  Psychiatric: Normal judgment and insight. Alert and oriented x 3.  Normal mood and appropriate affect.   Condition at discharge: stable  The results of significant diagnostics from this hospitalization (including imaging, microbiology, ancillary and laboratory) are listed below for reference.   Imaging Studies: DG HIP UNILAT W OR W/O PELVIS 2-3 VIEWS RIGHT  Result Date: 04/17/2023 CLINICAL DATA:  [id number] Post-operative state 252351 EXAM: DG HIP (WITH OR WITHOUT PELVIS) 2-3V RIGHT COMPARISON:  04/04/2023 FINDINGS: Postsurgical changes of right hip arthroplasty. Normal alignment. No evidence of loosening or periprosthetic fracture. Expected soft tissue changes. Unchanged left femoral head arthroplasty. IMPRESSION: Postsurgical changes of right hip arthroplasty. Normal alignment. No evidence of immediate hardware complication. Electronically Signed   By: Caprice Renshaw M.D.   On: 04/17/2023 16:38   DG HIP UNILAT WITH PELVIS 1V RIGHT  Result Date: 04/17/2023 CLINICAL DATA:  Elective surgery EXAM: DG HIP (WITH OR WITHOUT PELVIS) 1V RIGHT COMPARISON:  Preoperative exam 04/04/2023 FINDINGS: Two fluoroscopic spot views of the right hip obtained in the operating room. Removal of previous right hip hardware with interval hip arthroplasty. Fluoroscopy time 4.4 seconds. Dose 0.24 mGy. IMPRESSION: Intraoperative fluoroscopy during right hip arthroplasty. Electronically Signed   By: Narda Rutherford M.D.   On: 04/17/2023 14:59   DG C-Arm 1-60 Min-No Report  Result Date: 04/17/2023 Fluoroscopy was utilized by the requesting physician.  No radiographic interpretation.   DG C-Arm 1-60 Min-No Report  Result Date: 04/17/2023 Fluoroscopy was utilized by the requesting physician.  No radiographic interpretation.   DG Hip Unilat With Pelvis 2-3 Views Right  Result Date: 04/04/2023 CLINICAL DATA:  Right hip pain EXAM: DG HIP (WITH OR WITHOUT PELVIS) 2-3V RIGHT COMPARISON:  06/06/2022, 10/17/2022 FINDINGS: Bilateral total hip arthroplasties. No periprosthetic lucency or fracture.  Varus angulation of the right hip is unchanged compared to the previous CT. There is some osseous remodeling along the superior aspect of the right femoral neck and superolateral acetabulum, unchanged. No pelvic fracture or diastasis. IMPRESSION: Bilateral total hip arthroplasties without fracture or dislocation. No significant interval change in the appearance of the right hip compared to the previous CT. Electronically Signed   By:  Nicholas  Plundo D.O.   On: 04/04/2023 13:34    Microbiology: Results for orders placed or performed during the hospital encounter of 04/14/23  Surgical pcr screen     Status: None   Collection Time: 04/17/23  7:16 AM   Specimen: Nasal Mucosa; Nasal Swab  Result Value Ref Range Status   MRSA, PCR NEGATIVE NEGATIVE Final   Staphylococcus aureus NEGATIVE NEGATIVE Final    Comment: (NOTE) The Xpert SA Assay (FDA approved for NASAL specimens in patients 79 years of age and older), is one component of a comprehensive surveillance program. It is not intended to diagnose infection nor to guide or monitor treatment. Performed at [facility], [address], [place], [place] [address]   Aerobic/Anaerobic Culture w Gram Stain (surgical/deep wound)     Status: None (Preliminary result)   Collection Time: 04/17/23  1:45 PM   Specimen: Soft Tissue, Other  Result Value Ref Range Status   Specimen Description TISSUE RIGHT HIP  Final   Special Requests NONE  Final   Gram Stain NO WBC SEEN NO ORGANISMS SEEN   Final   Culture   Final    NO GROWTH 3 DAYS NO ANAEROBES ISOLATED; CULTURE IN PROGRESS FOR 5 DAYS Performed at [facility], [address], [place], [place] [address]    Report Status PENDING  Incomplete   Labs: CBC: Recent Labs  Lab 04/16/23 0226 04/17/23 0314 04/18/23 0125 04/19/23 0154 04/20/23 0312  WBC 5.1 5.4 9.9 7.9 8.7  NEUTROABS 2.5 2.9 7.9* 5.1 5.7  HGB 12.6* 12.8* 10.7* 10.0* 10.2*  HCT 39.9 39.3 33.5* 30.6* 32.1*  MCV 76.1*  76.3* 76.7* 76.7* 76.2*  PLT 211 224 212 183 211   Basic Metabolic Panel: Recent Labs  Lab 04/16/23 0226 04/17/23 0314 04/18/23 0125 04/19/23 0154 04/20/23 0312  NA 141 137 135 137 137  K 4.3 3.9 4.0 3.8 4.4  CL 106 104 103 105 102  CO2 23 23 22 26 25   GLUCOSE 101* 88 137* 133* 98  BUN 16 16 19 15 12   CREATININE 0.93 0.95 1.21 1.11 0.95  CALCIUM 9.3 9.0 8.8* 8.7* 8.8*  MG 2.1 2.0 1.8 2.1 2.2  PHOS 4.1 4.2 3.1 3.0 3.0   Liver Function Tests: Recent Labs  Lab 04/16/23 0226 04/17/23 0314 04/18/23 0125 04/19/23 0154 04/20/23 0312  AST 30 32 31 29 32  ALT 35 34 31 24 25   ALKPHOS 62 64 51 53 51  BILITOT 0.5 0.5 0.6 0.8 0.9  PROT 6.0* 6.3* 5.9* 6.0* 6.3*  ALBUMIN 3.4* 3.4* 3.3* 3.2* 3.1*   CBG: No results for input(s): "GLUCAP" in the last 168 hours.  Discharge time spent: greater than 30 minutes.  Signed: Marguerita Merles, DO [facility] 04/20/2023

## 2023-04-20 NOTE — Care Management Important Message (Signed)
Important Message  Patient Details  Name: DAKKOTA SPICKARD MRN: [id number] Date of Birth: 1961-01-03   Medicare Important Message Given:  Yes     Sherilyn Banker 04/20/2023, 11:33 AM

## 2023-04-20 NOTE — Progress Notes (Signed)
     Subjective:  Patient reports pain as mild.  Very pleased with his progress since surgery plan for DC home today.  Objective:   VITALS:   Vitals:   04/19/23 1731 04/19/23 2216 04/20/23 0537 04/20/23 0723  BP: 124/65 128/66 123/67 120/63  Pulse: 85 72 76 76  Resp: 18 18 18 18   Temp: 98 F (36.7 C) 98.5 F (36.9 C) 98.7 F (37.1 C) 98.8 F (37.1 C)  TempSrc:  Oral Oral Oral  SpO2: 100% 100% 100% 100%  Weight:      Height:        Sensation intact distally Intact pulses distally Dorsiflexion/Plantar flexion intact Incision: dressing C/D/I Compartment soft   Lab Results  Component Value Date   WBC 8.7 04/20/2023   HGB 10.2 (L) 04/20/2023   HCT 32.1 (L) 04/20/2023   MCV 76.2 (L) 04/20/2023   PLT 211 04/20/2023   BMET    Component Value Date/Time   NA 137 04/20/2023 0312   K 4.4 04/20/2023 0312   CL 102 04/20/2023 0312   CO2 25 04/20/2023 0312   GLUCOSE 98 04/20/2023 0312   BUN 12 04/20/2023 0312   CREATININE 0.95 04/20/2023 0312   CALCIUM 8.8 (L) 04/20/2023 0312   GFRNONAA >60 04/20/2023 1610      Xray: THA components in excellent position no adverse features  Assessment/Plan: 3 Days Post-Op   Principal Problem:   Periprosthetic hip fracture, sequela Active Problems:   Tobacco abuse   Hypertension   Hyperlipidemia   CAD S/P percutaneous coronary angioplasty  S/p R hip conversion to THA 04/17/23  Post op recs: WB: WBAT RLE, 6 weeks posterior hip precautions Abx: ancef while in-house, follow-up IntraOp culture, cefadroxil 500 twice daily for 7 days post discharge for extended surgical prophylaxis Imaging: PACU pelvis Xray Dressing: prevena wound vac not holding suction, converted to Aquacell dressing. DVT prophylaxis: Aspirin 81BID starting POD1, then resume full dose Brilinta postop day 2 Follow up: 04/28/23 with Dr. Blanchie Dessert at [facility].  Address: [address], [place], [place] [address]  Office Phone: [phone]   [name] 04/20/2023, 1:02 PM   Weber Cooks, MD  Contact information:   [phone] 7am-5pm epic message Dr. Blanchie Dessert, or call office for patient follow up: [phone] After hours and holidays please check [email or web address] for group call information for [facility]

## 2023-04-20 NOTE — Progress Notes (Signed)
Physical Therapy Treatment Patient Details Name: Tyler James MRN: [id number] DOB: 02-17-1961 Today's Date: 04/20/2023   History of Present Illness 62 y.o. male admitted 5/28 with Peri-prosthetic R hip fracture, s/p Conversion right hip resurfacing to total hip arthroplasty on 5/31. PMHx: CAD status post stenting, ischemic cardiomyopathy, hypertension.    PT Comments    Continuing work on functional mobility and activity tolerance;  Session focused on bed mobility, stair practice, and progressive amb in prep for dc home; Able to get up to EOB without physical assist with use of belt for RLE management/assist; reinforced precautions; discussed car transfers; Questions solicited and answered; OK for dc home from PT standpoint    Recommendations for follow up therapy are one component of a multi-disciplinary discharge planning process, led by the attending physician.  Recommendations may be updated based on patient status, additional functional criteria and insurance authorization.  Follow Up Recommendations       Assistance Recommended at Discharge Intermittent Supervision/Assistance  Patient can return home with the following A little help with walking and/or transfers;Assistance with cooking/housework   Equipment Recommendations  Rolling walker (2 wheels)    Recommendations for Other Services       Precautions / Restrictions Precautions Precautions: Posterior Hip Precaution Booklet Issued: Yes (comment) Precaution Comments: Discussed post hip prec with mobility Restrictions Weight Bearing Restrictions: Yes RLE Weight Bearing: Weight bearing as tolerated     Mobility  Bed Mobility Overal bed mobility: Needs Assistance Bed Mobility: Supine to Sit     Supine to sit: Supervision     General bed mobility comments: Cues and extra time for problem-solving technique; used belt to help move his RLE; no physical assist needed    Transfers Overall transfer level: Needs  assistance Equipment used: Rolling walker (2 wheels) Transfers: Sit to/from Stand Sit to Stand: Supervision, Min guard           General transfer comment: Cues to position for posterior precautions    Ambulation/Gait Ambulation/Gait assistance: Min guard Gait Distance (Feet): 140 Feet Assistive device: Rolling walker (2 wheels) Gait Pattern/deviations: Step-through pattern       General Gait Details: Smoother, more efficient steps than last session   Stairs Stairs: Yes Stairs assistance: Min guard Stair Management: No rails, Forwards, With walker Number of Stairs: 1 General stair comments: Cues for sequence   Wheelchair Mobility    Modified Rankin (Stroke Patients Only)       Balance     Sitting balance-Leahy Scale: Good       Standing balance-Leahy Scale: Fair                              Cognition Arousal/Alertness: Awake/alert Behavior During Therapy: WFL for tasks assessed/performed Overall Cognitive Status: Within Functional Limits for tasks assessed                                          Exercises      General Comments General comments (skin integrity, edema, etc.): Discussed car transfer      Pertinent Vitals/Pain Pain Assessment Pain Assessment: 0-10 Pain Score: 3  Pain Location: Rt hip Pain Descriptors / Indicators: Tender, Tightness Pain Intervention(s): Monitored during session    Home Living Family/patient expects to be discharged to:: Private residence Living Arrangements: Non-relatives/Friends Available Help at Discharge: Available 24 hours/day;Friend(s) Type of  Home: House Home Access: Ramped entrance       Home Layout: One level Home Equipment: Crutches;Shower seat      Prior Function            PT Goals (current goals can now be found in the care plan section) Acute Rehab PT Goals Patient Stated Goal: Get stronger before going home. Control pain. PT Goal Formulation: With  patient Time For Goal Achievement: 04/25/23 Potential to Achieve Goals: Good Progress towards PT goals: Progressing toward goals    Frequency    Min 5X/week      PT Plan Current plan remains appropriate    Co-evaluation              AM-PAC PT "6 Clicks" Mobility   Outcome Measure  Help needed turning from your back to your side while in a flat bed without using bedrails?: None Help needed moving from lying on your back to sitting on the side of a flat bed without using bedrails?: A Little Help needed moving to and from a bed to a chair (including a wheelchair)?: A Little Help needed standing up from a chair using your arms (e.g., wheelchair or bedside chair)?: A Little Help needed to walk in hospital room?: None Help needed climbing 3-5 steps with a railing? : A Little 6 Click Score: 20    End of Session Equipment Utilized During Treatment: Gait belt Activity Tolerance: Patient tolerated treatment well Patient left: in chair;with call bell/phone within reach Nurse Communication: Mobility status PT Visit Diagnosis: Unsteadiness on feet (R26.81);Other abnormalities of gait and mobility (R26.89);Difficulty in walking, not elsewhere classified (R26.2);Pain Pain - Right/Left: Right Pain - part of body: Hip     Time: 0925-1006 PT Time Calculation (min) (ACUTE ONLY): 41 min  Charges:  $Gait Training: 23-37 mins $Therapeutic Activity: 8-22 mins                     Van Clines, PT  Acute Rehabilitation Services Office [phone] Secure Chat welcomed    [name] 04/20/2023, 12:58 PM

## 2023-04-20 NOTE — ED Triage Notes (Signed)
Pt had right THA on Friday and was discharged today after wound vac therapy over the weekend. Now he says his incisional bandage is ballooned out with what looks like a large amount of blood underneath. Pain is controlled. Pt takes blood thinners.

## 2023-04-20 NOTE — TOC Transition Note (Signed)
Transition of Care [facility]) - CM/SW Discharge Note   Patient Details  Name: Tyler James MRN: [id number] Date of Birth: 11/24/1960  [facility]) CM/SW Contact:  Epifanio Lesches, RN Phone Number: 04/20/2023, 1:24 PM   Clinical Narrative:    Patient will DC to: home Anticipated DC date: 04/20/2023 Family notified: yes Transport by: car         - s/p  total hip arthroplasty on 5/31  Per MD patient ready for DC today . RN, and patient notified of DC. Ot agreeablt to home health services. Pt without provider preference. Referral made with [facility] and accepted. Pt states resides with girlfriend. States girlfriend will help assist with care once d/c. Referral made with [employer] for RW. Equipment will be delivered to bedside prior to d/c.  Family to provide transportation to home  Pt without RX med concerns. Post hospital f/u noted on AVS.  RNCM will sign off for now as intervention is no longer needed. Please consult [place] again if new needs arise.    Final next level of care: Home w Home Health Services Barriers to Discharge: No Barriers Identified   Patient Goals and CMS Choice      Discharge Placement                         Discharge Plan and Services Additional resources added to the After Visit Summary for                  DME Arranged: Walker rolling DME Agency: [employer] Date DME Agency Contacted: 04/20/23 Time DME Agency Contacted: 1158 Representative spoke with at DME Agency: Vaughan Basta HH Arranged: PT, OT HH Agency: [facility] Date [facility] Agency Contacted: 04/20/23 Time HH Agency Contacted: 1158 Representative spoke with at [facility] Agency: Tresa Endo  Social Determinants of Health (SDOH) Interventions SDOH Screenings   Food Insecurity: No Food Insecurity (04/14/2023)  Housing: Low Risk  (04/14/2023)  Transportation Needs: No Transportation Needs (04/14/2023)  Utilities: Not At Risk (04/14/2023)  Tobacco Use: High Risk  (04/17/2023)     Readmission Risk Interventions     No data to display

## 2023-04-21 LAB — HEMOGLOBIN A1C
Hgb A1c MFr Bld: 5.9 % — ABNORMAL HIGH (ref 4.8–5.6)
Mean Plasma Glucose: 123 mg/dL

## 2023-04-21 LAB — AEROBIC/ANAEROBIC CULTURE W GRAM STAIN (SURGICAL/DEEP WOUND)

## 2023-04-21 NOTE — Anesthesia Postprocedure Evaluation (Addendum)
Anesthesia Post Note  Patient: Tyler James  Procedure(s) Performed: CONVERSION HIP RESURFACING TO TOTAL HIP REPLACEMENT (Right: Hip)     Patient location during evaluation: PACU Anesthesia Type: General Level of consciousness: awake and alert Pain management: pain level controlled Vital Signs Assessment: post-procedure vital signs reviewed and stable Respiratory status: spontaneous breathing, nonlabored ventilation, respiratory function stable and patient connected to nasal cannula oxygen Cardiovascular status: blood pressure returned to baseline and stable Postop Assessment: no apparent nausea or vomiting Anesthetic complications: no   There were no known notable events for this encounter.              Shelton Silvas

## 2023-04-22 DIAGNOSIS — S72001D Fracture of unspecified part of neck of right femur, subsequent encounter for closed fracture with routine healing: Secondary | ICD-10-CM | POA: Diagnosis not present

## 2023-04-22 DIAGNOSIS — Z7902 Long term (current) use of antithrombotics/antiplatelets: Secondary | ICD-10-CM | POA: Diagnosis not present

## 2023-04-22 DIAGNOSIS — I2510 Atherosclerotic heart disease of native coronary artery without angina pectoris: Secondary | ICD-10-CM | POA: Diagnosis not present

## 2023-04-22 DIAGNOSIS — R739 Hyperglycemia, unspecified: Secondary | ICD-10-CM | POA: Diagnosis not present

## 2023-04-22 DIAGNOSIS — E785 Hyperlipidemia, unspecified: Secondary | ICD-10-CM | POA: Diagnosis not present

## 2023-04-22 DIAGNOSIS — R7303 Prediabetes: Secondary | ICD-10-CM | POA: Diagnosis not present

## 2023-04-22 DIAGNOSIS — Z955 Presence of coronary angioplasty implant and graft: Secondary | ICD-10-CM | POA: Diagnosis not present

## 2023-04-22 DIAGNOSIS — F17210 Nicotine dependence, cigarettes, uncomplicated: Secondary | ICD-10-CM | POA: Diagnosis not present

## 2023-04-22 DIAGNOSIS — D509 Iron deficiency anemia, unspecified: Secondary | ICD-10-CM | POA: Diagnosis not present

## 2023-04-22 DIAGNOSIS — Z96642 Presence of left artificial hip joint: Secondary | ICD-10-CM | POA: Diagnosis not present

## 2023-04-22 DIAGNOSIS — I255 Ischemic cardiomyopathy: Secondary | ICD-10-CM | POA: Diagnosis not present

## 2023-04-22 DIAGNOSIS — I10 Essential (primary) hypertension: Secondary | ICD-10-CM | POA: Diagnosis not present

## 2023-04-22 DIAGNOSIS — M9701XD Periprosthetic fracture around internal prosthetic right hip joint, subsequent encounter: Secondary | ICD-10-CM | POA: Diagnosis not present

## 2023-04-22 LAB — AEROBIC/ANAEROBIC CULTURE W GRAM STAIN (SURGICAL/DEEP WOUND)

## 2023-04-23 DIAGNOSIS — M9701XD Periprosthetic fracture around internal prosthetic right hip joint, subsequent encounter: Secondary | ICD-10-CM | POA: Diagnosis not present

## 2023-04-24 ENCOUNTER — Telehealth: Payer: Self-pay

## 2023-04-24 LAB — AEROBIC/ANAEROBIC CULTURE W GRAM STAIN (SURGICAL/DEEP WOUND): Gram Stain: NONE SEEN

## 2023-04-24 NOTE — Telephone Encounter (Signed)
Transition Care Management Follow-up Telephone Call Date of discharge and from where: Tyler James 6/3 How have you been since you were released from the hospital? Alright  Any questions or concerns? No  Items Reviewed: Did the pt receive and understand the discharge instructions provided? Yes  Medications obtained and verified? Yes  Other? No  Any new allergies since your discharge? No  Dietary orders reviewed? No Do you have support at home? Yes     Follow up appointments reviewed:  PCP Hospital f/u appt confirmed? Yes  Scheduled to see  on  @ . Specialist Hospital f/u appt confirmed? No  Scheduled to see  on  @ . Are transportation arrangements needed? No  If their condition worsens, is the pt aware to call PCP or go to the Emergency Dept.? Yes Was the patient provided with contact information for the PCP's office or ED? Yes Was to pt encouraged to call back with questions or concerns? Yes

## 2023-04-25 DIAGNOSIS — Z7902 Long term (current) use of antithrombotics/antiplatelets: Secondary | ICD-10-CM | POA: Diagnosis not present

## 2023-04-25 DIAGNOSIS — E785 Hyperlipidemia, unspecified: Secondary | ICD-10-CM | POA: Diagnosis not present

## 2023-04-25 DIAGNOSIS — I255 Ischemic cardiomyopathy: Secondary | ICD-10-CM | POA: Diagnosis not present

## 2023-04-25 DIAGNOSIS — S72001D Fracture of unspecified part of neck of right femur, subsequent encounter for closed fracture with routine healing: Secondary | ICD-10-CM | POA: Diagnosis not present

## 2023-04-25 DIAGNOSIS — F17210 Nicotine dependence, cigarettes, uncomplicated: Secondary | ICD-10-CM | POA: Diagnosis not present

## 2023-04-25 DIAGNOSIS — R739 Hyperglycemia, unspecified: Secondary | ICD-10-CM | POA: Diagnosis not present

## 2023-04-25 DIAGNOSIS — I10 Essential (primary) hypertension: Secondary | ICD-10-CM | POA: Diagnosis not present

## 2023-04-25 DIAGNOSIS — I2510 Atherosclerotic heart disease of native coronary artery without angina pectoris: Secondary | ICD-10-CM | POA: Diagnosis not present

## 2023-04-25 DIAGNOSIS — Z955 Presence of coronary angioplasty implant and graft: Secondary | ICD-10-CM | POA: Diagnosis not present

## 2023-04-25 DIAGNOSIS — Z96642 Presence of left artificial hip joint: Secondary | ICD-10-CM | POA: Diagnosis not present

## 2023-04-25 DIAGNOSIS — D509 Iron deficiency anemia, unspecified: Secondary | ICD-10-CM | POA: Diagnosis not present

## 2023-04-25 DIAGNOSIS — M9701XD Periprosthetic fracture around internal prosthetic right hip joint, subsequent encounter: Secondary | ICD-10-CM | POA: Diagnosis not present

## 2023-04-25 DIAGNOSIS — R7303 Prediabetes: Secondary | ICD-10-CM | POA: Diagnosis not present

## 2023-04-27 DIAGNOSIS — S72001D Fracture of unspecified part of neck of right femur, subsequent encounter for closed fracture with routine healing: Secondary | ICD-10-CM | POA: Diagnosis not present

## 2023-04-27 DIAGNOSIS — I10 Essential (primary) hypertension: Secondary | ICD-10-CM | POA: Diagnosis not present

## 2023-04-27 DIAGNOSIS — I255 Ischemic cardiomyopathy: Secondary | ICD-10-CM | POA: Diagnosis not present

## 2023-04-27 DIAGNOSIS — I2510 Atherosclerotic heart disease of native coronary artery without angina pectoris: Secondary | ICD-10-CM | POA: Diagnosis not present

## 2023-04-27 DIAGNOSIS — E785 Hyperlipidemia, unspecified: Secondary | ICD-10-CM | POA: Diagnosis not present

## 2023-04-27 DIAGNOSIS — Z955 Presence of coronary angioplasty implant and graft: Secondary | ICD-10-CM | POA: Diagnosis not present

## 2023-04-27 DIAGNOSIS — R7303 Prediabetes: Secondary | ICD-10-CM | POA: Diagnosis not present

## 2023-04-27 DIAGNOSIS — D509 Iron deficiency anemia, unspecified: Secondary | ICD-10-CM | POA: Diagnosis not present

## 2023-04-27 DIAGNOSIS — Z7902 Long term (current) use of antithrombotics/antiplatelets: Secondary | ICD-10-CM | POA: Diagnosis not present

## 2023-04-27 DIAGNOSIS — Z96642 Presence of left artificial hip joint: Secondary | ICD-10-CM | POA: Diagnosis not present

## 2023-04-27 DIAGNOSIS — R739 Hyperglycemia, unspecified: Secondary | ICD-10-CM | POA: Diagnosis not present

## 2023-04-27 DIAGNOSIS — F17210 Nicotine dependence, cigarettes, uncomplicated: Secondary | ICD-10-CM | POA: Diagnosis not present

## 2023-04-27 DIAGNOSIS — M9701XD Periprosthetic fracture around internal prosthetic right hip joint, subsequent encounter: Secondary | ICD-10-CM | POA: Diagnosis not present

## 2023-04-28 DIAGNOSIS — I10 Essential (primary) hypertension: Secondary | ICD-10-CM | POA: Diagnosis not present

## 2023-04-28 DIAGNOSIS — I255 Ischemic cardiomyopathy: Secondary | ICD-10-CM | POA: Diagnosis not present

## 2023-04-28 DIAGNOSIS — M9701XA Periprosthetic fracture around internal prosthetic right hip joint, initial encounter: Secondary | ICD-10-CM | POA: Diagnosis not present

## 2023-04-28 DIAGNOSIS — E785 Hyperlipidemia, unspecified: Secondary | ICD-10-CM | POA: Diagnosis not present

## 2023-04-28 DIAGNOSIS — I5022 Chronic systolic (congestive) heart failure: Secondary | ICD-10-CM | POA: Diagnosis not present

## 2023-04-28 DIAGNOSIS — M25551 Pain in right hip: Secondary | ICD-10-CM | POA: Diagnosis not present

## 2023-04-28 DIAGNOSIS — F17200 Nicotine dependence, unspecified, uncomplicated: Secondary | ICD-10-CM | POA: Diagnosis not present

## 2023-04-28 DIAGNOSIS — D649 Anemia, unspecified: Secondary | ICD-10-CM | POA: Diagnosis not present

## 2023-04-28 DIAGNOSIS — I2510 Atherosclerotic heart disease of native coronary artery without angina pectoris: Secondary | ICD-10-CM | POA: Diagnosis not present

## 2023-04-30 DIAGNOSIS — I255 Ischemic cardiomyopathy: Secondary | ICD-10-CM | POA: Diagnosis not present

## 2023-04-30 DIAGNOSIS — E785 Hyperlipidemia, unspecified: Secondary | ICD-10-CM | POA: Diagnosis not present

## 2023-04-30 DIAGNOSIS — I10 Essential (primary) hypertension: Secondary | ICD-10-CM | POA: Diagnosis not present

## 2023-04-30 DIAGNOSIS — Z96642 Presence of left artificial hip joint: Secondary | ICD-10-CM | POA: Diagnosis not present

## 2023-04-30 DIAGNOSIS — Z7902 Long term (current) use of antithrombotics/antiplatelets: Secondary | ICD-10-CM | POA: Diagnosis not present

## 2023-04-30 DIAGNOSIS — R739 Hyperglycemia, unspecified: Secondary | ICD-10-CM | POA: Diagnosis not present

## 2023-04-30 DIAGNOSIS — Z955 Presence of coronary angioplasty implant and graft: Secondary | ICD-10-CM | POA: Diagnosis not present

## 2023-04-30 DIAGNOSIS — I2510 Atherosclerotic heart disease of native coronary artery without angina pectoris: Secondary | ICD-10-CM | POA: Diagnosis not present

## 2023-04-30 DIAGNOSIS — R7303 Prediabetes: Secondary | ICD-10-CM | POA: Diagnosis not present

## 2023-04-30 DIAGNOSIS — M9701XD Periprosthetic fracture around internal prosthetic right hip joint, subsequent encounter: Secondary | ICD-10-CM | POA: Diagnosis not present

## 2023-04-30 DIAGNOSIS — D509 Iron deficiency anemia, unspecified: Secondary | ICD-10-CM | POA: Diagnosis not present

## 2023-04-30 DIAGNOSIS — S72001D Fracture of unspecified part of neck of right femur, subsequent encounter for closed fracture with routine healing: Secondary | ICD-10-CM | POA: Diagnosis not present

## 2023-04-30 DIAGNOSIS — F17210 Nicotine dependence, cigarettes, uncomplicated: Secondary | ICD-10-CM | POA: Diagnosis not present

## 2023-05-05 DIAGNOSIS — M9701XD Periprosthetic fracture around internal prosthetic right hip joint, subsequent encounter: Secondary | ICD-10-CM | POA: Diagnosis not present

## 2023-05-06 DIAGNOSIS — R7303 Prediabetes: Secondary | ICD-10-CM | POA: Diagnosis not present

## 2023-05-06 DIAGNOSIS — S72001D Fracture of unspecified part of neck of right femur, subsequent encounter for closed fracture with routine healing: Secondary | ICD-10-CM | POA: Diagnosis not present

## 2023-05-06 DIAGNOSIS — R739 Hyperglycemia, unspecified: Secondary | ICD-10-CM | POA: Diagnosis not present

## 2023-05-06 DIAGNOSIS — Z7902 Long term (current) use of antithrombotics/antiplatelets: Secondary | ICD-10-CM | POA: Diagnosis not present

## 2023-05-06 DIAGNOSIS — F17210 Nicotine dependence, cigarettes, uncomplicated: Secondary | ICD-10-CM | POA: Diagnosis not present

## 2023-05-06 DIAGNOSIS — D509 Iron deficiency anemia, unspecified: Secondary | ICD-10-CM | POA: Diagnosis not present

## 2023-05-06 DIAGNOSIS — Z955 Presence of coronary angioplasty implant and graft: Secondary | ICD-10-CM | POA: Diagnosis not present

## 2023-05-06 DIAGNOSIS — E785 Hyperlipidemia, unspecified: Secondary | ICD-10-CM | POA: Diagnosis not present

## 2023-05-06 DIAGNOSIS — I2510 Atherosclerotic heart disease of native coronary artery without angina pectoris: Secondary | ICD-10-CM | POA: Diagnosis not present

## 2023-05-06 DIAGNOSIS — M9701XD Periprosthetic fracture around internal prosthetic right hip joint, subsequent encounter: Secondary | ICD-10-CM | POA: Diagnosis not present

## 2023-05-06 DIAGNOSIS — I10 Essential (primary) hypertension: Secondary | ICD-10-CM | POA: Diagnosis not present

## 2023-05-06 DIAGNOSIS — Z96642 Presence of left artificial hip joint: Secondary | ICD-10-CM | POA: Diagnosis not present

## 2023-05-06 DIAGNOSIS — I255 Ischemic cardiomyopathy: Secondary | ICD-10-CM | POA: Diagnosis not present

## 2023-05-19 DIAGNOSIS — M9701XD Periprosthetic fracture around internal prosthetic right hip joint, subsequent encounter: Secondary | ICD-10-CM | POA: Diagnosis not present

## 2023-06-02 DIAGNOSIS — M9701XD Periprosthetic fracture around internal prosthetic right hip joint, subsequent encounter: Secondary | ICD-10-CM | POA: Diagnosis not present

## 2023-06-04 ENCOUNTER — Encounter: Payer: Self-pay | Admitting: Cardiology

## 2023-06-04 ENCOUNTER — Ambulatory Visit: Payer: 59 | Attending: Cardiology | Admitting: Cardiology

## 2023-06-04 VITALS — BP 128/64 | HR 70 | Ht 69.0 in | Wt 164.6 lb

## 2023-06-04 DIAGNOSIS — E782 Mixed hyperlipidemia: Secondary | ICD-10-CM

## 2023-06-04 DIAGNOSIS — Z1389 Encounter for screening for other disorder: Secondary | ICD-10-CM | POA: Diagnosis not present

## 2023-06-04 DIAGNOSIS — I255 Ischemic cardiomyopathy: Secondary | ICD-10-CM | POA: Diagnosis not present

## 2023-06-04 DIAGNOSIS — E785 Hyperlipidemia, unspecified: Secondary | ICD-10-CM | POA: Diagnosis not present

## 2023-06-04 DIAGNOSIS — F17200 Nicotine dependence, unspecified, uncomplicated: Secondary | ICD-10-CM | POA: Diagnosis not present

## 2023-06-04 DIAGNOSIS — I10 Essential (primary) hypertension: Secondary | ICD-10-CM

## 2023-06-04 DIAGNOSIS — I2510 Atherosclerotic heart disease of native coronary artery without angina pectoris: Secondary | ICD-10-CM

## 2023-06-04 DIAGNOSIS — F17210 Nicotine dependence, cigarettes, uncomplicated: Secondary | ICD-10-CM | POA: Diagnosis not present

## 2023-06-04 DIAGNOSIS — I5022 Chronic systolic (congestive) heart failure: Secondary | ICD-10-CM | POA: Diagnosis not present

## 2023-06-04 DIAGNOSIS — Z0001 Encounter for general adult medical examination with abnormal findings: Secondary | ICD-10-CM | POA: Diagnosis not present

## 2023-06-04 DIAGNOSIS — D649 Anemia, unspecified: Secondary | ICD-10-CM | POA: Diagnosis not present

## 2023-06-04 MED ORDER — NITROGLYCERIN 0.4 MG SL SUBL: 0.4000 mg | SUBLINGUAL_TABLET | SUBLINGUAL | 3 refills | Status: DC | PRN

## 2023-06-04 NOTE — Progress Notes (Signed)
Clinical Summary Tyler James is a 62 y.o.male  seen today for follow up of the following medical problems.    1. CAD - NSTEMI 02/2016, received DES to OM2 and DES to RCA.  - echo 02/2016 LVEF 45-50%  - unclear if possible facial rash on lisinopril - has been on losartan and tolerating.      - admit Jan 2024 with STEMI - cardiac catheterization with ostial left circumflex at 75%, proximal left circumflex 95% stenosis treated with PCI/DES x 1, overlapping previous stent placed, known 85% stenosis of OM1 and 55% stenosis of jailed ostial AV groove treated with PTCA.  - It was recommended that Brilinta would likely be continued beyond a year at reduced dose.  - Jan 2024 echo: LVEF 55%    -no chest pains, no SOB/DOE  - compliant with meds   2. Hyperlipidemia - higher dose lipitor caused GI upset. Has tolerated atorvastatin 40mg  daily.  - compliant with statin  Jan 2024 TC 117 TG 54 HDL 53 LDL 53     3. HTN Compliant with meds   4. AAA screen - will need next year at age 58    5. Hip fracture - admit 04/2023 periprohstetic fracture - doing well after surgery   SH: he is on disability   Past Medical History:  Diagnosis Date   CAD (coronary artery disease)    a. 02/2016: STEMI - 100% stenosis OM2 w/ DES placed, POBA to LCx, and staged PCI of 95% stenosed RCA w/ DES.  Also in 11/2022   Hypertension    Ischemic cardiomyopathy    a. 02/2016: EF 45-50% w/ diffuse inferolateral hypokinesis     Allergies  Allergen Reactions   Ibuprofen Swelling    Swelling of face     Current Outpatient Medications  Medication Sig Dispense Refill   aspirin EC 81 MG tablet Take 81 mg by mouth daily. Swallow whole.     atorvastatin (LIPITOR) 80 MG tablet Take 1 tablet (80 mg total) by mouth daily. 90 tablet 1   diphenhydramine-acetaminophen (TYLENOL PM) 25-500 MG TABS tablet Take 1 tablet by mouth at bedtime as needed (Pain).     docusate sodium (COLACE) 100 MG capsule Take 1 capsule  (100 mg total) by mouth 2 (two) times daily. 10 capsule 0   losartan (COZAAR) 25 MG tablet Take 25 mg by mouth daily.     metoprolol succinate (TOPROL XL) 25 MG 24 hr tablet Take 1 tablet (25 mg total) by mouth daily. 90 tablet 3   Multiple Vitamin (MULTIVITAMIN WITH MINERALS) TABS tablet Take 1 tablet by mouth daily. 30 tablet 0   nitroGLYCERIN (NITROSTAT) 0.4 MG SL tablet Place 1 tablet (0.4 mg total) under the tongue every 5 (five) minutes as needed for chest pain. 25 tablet 3   polyethylene glycol (MIRALAX / GLYCOLAX) 17 g packet Take 17 g by mouth daily as needed for mild constipation. 14 each 0   ticagrelor (BRILINTA) 90 MG TABS tablet Take 1 tablet (90 mg total) by mouth 2 (two) times daily. 180 tablet 2   No current facility-administered medications for this visit.     Past Surgical History:  Procedure Laterality Date   CARDIAC CATHETERIZATION N/A 03/10/2016   Procedure: Left Heart Cath and Coronary Angiography;  Surgeon: Peter M Swaziland, MD;  Location: [facility] INVASIVE CV LAB;  Service: Cardiovascular;  Laterality: N/A;   CARDIAC CATHETERIZATION N/A 03/10/2016   Procedure: Coronary Stent Intervention;  Surgeon: Peter M Swaziland,  MD;  Location: [facility] INVASIVE CV LAB;  Service: Cardiovascular;  Laterality: N/A;   CARDIAC CATHETERIZATION N/A 03/12/2016   Procedure: Coronary Stent Intervention;  Surgeon: Kathleene Hazel, MD;  Location[facility] INVASIVE CV LAB;  Service: Cardiovascular;  Laterality: N/A;   CONVERSION TO TOTAL HIP Right 04/17/2023   Procedure: CONVERSION HIP RESURFACING TO TOTAL HIP REPLACEMENT;  Surgeon: [name], MD;  Location: [facility] OR;  Service: Orthopedics;  Laterality: Right;   CORONARY/GRAFT ACUTE MI REVASCULARIZATION N/A 11/26/2022   Procedure: Coronary/Graft Acute MI Revascularization;  Surgeon: Marykay Lex, MD;  Location: [facility] INVASIVE CV LAB;  Service: Cardiovascular;  Laterality: N/A;   LEFT HEART CATH AND CORONARY ANGIOGRAPHY N/A 11/26/2022   Procedure: LEFT  HEART CATH AND CORONARY ANGIOGRAPHY;  Surgeon: Marykay Lex, MD;  Location: [facility] INVASIVE CV LAB;  Service: Cardiovascular;  Laterality: N/A;   TOTAL HIP ARTHROPLASTY     bilateral     Allergies  Allergen Reactions   Ibuprofen Swelling    Swelling of face      No family history on file.   Social History Tyler James reports that he has been smoking cigarettes and cigars. He has never used smokeless tobacco. Tyler James reports current alcohol use.   Review of Systems CONSTITUTIONAL: No weight loss, fever, chills, weakness or fatigue.  HEENT: Eyes: No visual loss, blurred vision, double vision or yellow sclerae.No hearing loss, sneezing, congestion, runny nose or sore throat.  SKIN: No rash or itching.  CARDIOVASCULAR: per hpi RESPIRATORY: No shortness of breath, cough or sputum.  GASTROINTESTINAL: No anorexia, nausea, vomiting or diarrhea. No abdominal pain or blood.  GENITOURINARY: No burning on urination, no polyuria NEUROLOGICAL: No headache, dizziness, syncope, paralysis, ataxia, numbness or tingling in the extremities. No change in bowel or bladder control.  MUSCULOSKELETAL: No muscle, back pain, joint pain or stiffness.  LYMPHATICS: No enlarged nodes. No history of splenectomy.  PSYCHIATRIC: No history of depression or anxiety.  ENDOCRINOLOGIC: No reports of sweating, cold or heat intolerance. No polyuria or polydipsia.  Marland Kitchen   Physical Examination Vitals:   06/04/23 1106  BP: 128/64  Pulse: 70  SpO2: 99%   Filed Weights   06/04/23 1106  Weight: 164 lb 9.6 oz (74.7 kg)    Gen: resting comfortably, no acute distress HEENT: no scleral icterus, pupils equal round and reactive, no palptable cervical adenopathy,  CV: RRR, no m/rg, no jvd Resp: Clear to auscultation bilaterally GI: abdomen is soft, non-tender, non-distended, normal bowel sounds, no hepatosplenomegaly MSK: extremities are warm, no edema.  Skin: warm, no rash Neuro:  no focal deficits Psych:  appropriate affect   Diagnostic Studies  02/2016 Cath Ost 1st Mrg lesion, 90% stenosed. Prox LAD lesion, 30% stenosed. Mid RCA lesion, 95% stenosed. Ost 2nd Mrg to 2nd Mrg lesion, 100% stenosed. Post intervention, there is a 0% residual stenosis. Prox Cx lesion, 50% stenosed. Post intervention, there is a 10% residual stenosis.   1. 2 vessel obstructive CAD. The culprit lesion is occlusion of a large OM2 Yajaira Doffing. There is severe mid RCA stenosis. The first OM is very small. 2. Successful stenting of the second OM with DES. POBA of the distal LCx through the stent struts.   Plan: DAPT for one year. Recommend PCI of the RCA prior to DC. Will assess LV function with Echo.   02/2016 Cath Prox RCA lesion, 99% stenosed. Post intervention, there is a 0% residual stenosis.   1. Severe stenosis mid RCA 2. Successful PTCA/DES x 1  mid RCA   Recommendations: Will continue DAPT with ASA and Brilinta. Continue statin and beta blocker.    02/2016 echo Study Conclusions   - Left ventricle: The cavity size was normal. Wall thickness was   normal. Systolic function was mildly reduced. The estimated   ejection fraction was in the range of 45% to 50%. There is   hypokinesis of the inferolateral myocardium. Left ventricular   diastolic function parameters were normal. - Tricuspid valve: There was mild-moderate regurgitation. - Pulmonary arteries: Systolic pressure was mildly increased. PA   peak pressure: 42 mm Hg (S).   Impressions:   - Hypokinesis of the inferior lateral wall with overall mildly   reduced LV function; redundant MV chordae; trace MR; mild to   moderate TR; mildly elevated pulmonary pressure.   Assessment and Plan  1. CAD - no recent symptoms, continue current meds including DAPT - at f/u decide on discontinuing brillinta vs low dose chronically   2. Hyperlipidemia -at goal, continue current meds   3. HTN - at goal, continue current meds      Antoine Poche,  M.D

## 2023-06-04 NOTE — Patient Instructions (Signed)
Medication Instructions:  Continue all current medications.   Labwork: none  Testing/Procedures: none  Follow-Up: 6 months   Any Other Special Instructions Will Be Listed Below (If Applicable).   If you need a refill on your cardiac medications before your next appointment, please call your pharmacy.  

## 2023-06-08 ENCOUNTER — Encounter: Payer: Self-pay | Admitting: *Deleted

## 2023-06-09 ENCOUNTER — Encounter: Payer: Self-pay | Admitting: *Deleted

## 2023-07-05 DIAGNOSIS — M25551 Pain in right hip: Secondary | ICD-10-CM | POA: Diagnosis not present

## 2023-07-05 DIAGNOSIS — I5022 Chronic systolic (congestive) heart failure: Secondary | ICD-10-CM | POA: Diagnosis not present

## 2023-08-05 DIAGNOSIS — I5022 Chronic systolic (congestive) heart failure: Secondary | ICD-10-CM | POA: Diagnosis not present

## 2023-08-05 DIAGNOSIS — M25551 Pain in right hip: Secondary | ICD-10-CM | POA: Diagnosis not present

## 2023-09-04 DIAGNOSIS — I5022 Chronic systolic (congestive) heart failure: Secondary | ICD-10-CM | POA: Diagnosis not present

## 2023-09-04 DIAGNOSIS — M25551 Pain in right hip: Secondary | ICD-10-CM | POA: Diagnosis not present

## 2023-10-05 DIAGNOSIS — M25551 Pain in right hip: Secondary | ICD-10-CM | POA: Diagnosis not present

## 2023-10-05 DIAGNOSIS — I5022 Chronic systolic (congestive) heart failure: Secondary | ICD-10-CM | POA: Diagnosis not present

## 2023-11-04 DIAGNOSIS — M25551 Pain in right hip: Secondary | ICD-10-CM | POA: Diagnosis not present

## 2023-11-04 DIAGNOSIS — I5022 Chronic systolic (congestive) heart failure: Secondary | ICD-10-CM | POA: Diagnosis not present

## 2023-12-08 DIAGNOSIS — I10 Essential (primary) hypertension: Secondary | ICD-10-CM | POA: Diagnosis not present

## 2023-12-08 DIAGNOSIS — I2510 Atherosclerotic heart disease of native coronary artery without angina pectoris: Secondary | ICD-10-CM | POA: Diagnosis not present

## 2023-12-08 DIAGNOSIS — I5022 Chronic systolic (congestive) heart failure: Secondary | ICD-10-CM | POA: Diagnosis not present

## 2023-12-08 DIAGNOSIS — E785 Hyperlipidemia, unspecified: Secondary | ICD-10-CM | POA: Diagnosis not present

## 2023-12-11 ENCOUNTER — Encounter (INDEPENDENT_AMBULATORY_CARE_PROVIDER_SITE_OTHER): Payer: Self-pay | Admitting: *Deleted

## 2024-01-08 DIAGNOSIS — M25551 Pain in right hip: Secondary | ICD-10-CM | POA: Diagnosis not present

## 2024-01-08 DIAGNOSIS — I5022 Chronic systolic (congestive) heart failure: Secondary | ICD-10-CM | POA: Diagnosis not present

## 2024-02-05 DIAGNOSIS — I5022 Chronic systolic (congestive) heart failure: Secondary | ICD-10-CM | POA: Diagnosis not present

## 2024-02-05 DIAGNOSIS — M25551 Pain in right hip: Secondary | ICD-10-CM | POA: Diagnosis not present

## 2024-02-15 ENCOUNTER — Emergency Department (HOSPITAL_COMMUNITY)

## 2024-02-15 ENCOUNTER — Encounter (HOSPITAL_COMMUNITY): Payer: Self-pay | Admitting: Emergency Medicine

## 2024-02-15 ENCOUNTER — Other Ambulatory Visit: Payer: Self-pay

## 2024-02-15 ENCOUNTER — Encounter (INDEPENDENT_AMBULATORY_CARE_PROVIDER_SITE_OTHER): Payer: Self-pay | Admitting: *Deleted

## 2024-02-15 ENCOUNTER — Emergency Department (HOSPITAL_COMMUNITY)
Admission: EM | Admit: 2024-02-15 | Discharge: 2024-02-15 | Disposition: A | Discharge status: Home / Self Care | Attending: Emergency Medicine | Admitting: Emergency Medicine

## 2024-02-15 DIAGNOSIS — I10 Essential (primary) hypertension: Secondary | ICD-10-CM | POA: Diagnosis not present

## 2024-02-15 DIAGNOSIS — R519 Headache, unspecified: Secondary | ICD-10-CM | POA: Diagnosis not present

## 2024-02-15 DIAGNOSIS — J101 Influenza due to other identified influenza virus with other respiratory manifestations: Secondary | ICD-10-CM | POA: Insufficient documentation

## 2024-02-15 DIAGNOSIS — R059 Cough, unspecified: Secondary | ICD-10-CM | POA: Diagnosis not present

## 2024-02-15 LAB — CBC WITH DIFFERENTIAL/PLATELET
Abs Immature Granulocytes: 0.02 10*3/uL (ref 0.00–0.07)
Basophils Absolute: 0 10*3/uL (ref 0.0–0.1)
Basophils Relative: 1 %
Eosinophils Absolute: 0 10*3/uL (ref 0.0–0.5)
Eosinophils Relative: 1 %
HCT: 39 % (ref 39.0–52.0)
Hemoglobin: 12.9 g/dL — ABNORMAL LOW (ref 13.0–17.0)
Immature Granulocytes: 0 %
Lymphocytes Relative: 10 %
Lymphs Abs: 0.5 10*3/uL — ABNORMAL LOW (ref 0.7–4.0)
MCH: 24.6 pg — ABNORMAL LOW (ref 26.0–34.0)
MCHC: 33.1 g/dL (ref 30.0–36.0)
MCV: 74.4 fL — ABNORMAL LOW (ref 80.0–100.0)
Monocytes Absolute: 0.5 10*3/uL (ref 0.1–1.0)
Monocytes Relative: 10 %
Neutro Abs: 4.1 10*3/uL (ref 1.7–7.7)
Neutrophils Relative %: 78 %
Platelets: 179 10*3/uL (ref 150–400)
RBC: 5.24 MIL/uL (ref 4.22–5.81)
RDW: 15 % (ref 11.5–15.5)
WBC: 5.2 10*3/uL (ref 4.0–10.5)
nRBC: 0 % (ref 0.0–0.2)

## 2024-02-15 LAB — URINALYSIS, ROUTINE W REFLEX MICROSCOPIC
Bacteria, UA: NONE SEEN
Bilirubin Urine: NEGATIVE
Glucose, UA: NEGATIVE mg/dL
Ketones, ur: NEGATIVE mg/dL
Leukocytes,Ua: NEGATIVE
Nitrite: NEGATIVE
Protein, ur: NEGATIVE mg/dL
Specific Gravity, Urine: 1.019 (ref 1.005–1.030)
pH: 6 (ref 5.0–8.0)

## 2024-02-15 LAB — CK: Total CK: 179 U/L (ref 49–397)

## 2024-02-15 LAB — RESP PANEL BY RT-PCR (RSV, FLU A&B, COVID)  RVPGX2
Influenza A by PCR: POSITIVE — AB
Influenza B by PCR: NEGATIVE
Resp Syncytial Virus by PCR: NEGATIVE
SARS Coronavirus 2 by RT PCR: NEGATIVE

## 2024-02-15 LAB — COMPREHENSIVE METABOLIC PANEL WITH GFR
ALT: 107 U/L — ABNORMAL HIGH (ref 0–44)
AST: 130 U/L — ABNORMAL HIGH (ref 15–41)
Albumin: 4 g/dL (ref 3.5–5.0)
Alkaline Phosphatase: 62 U/L (ref 38–126)
Anion gap: 13 (ref 5–15)
BUN: 17 mg/dL (ref 8–23)
CO2: 19 mmol/L — ABNORMAL LOW (ref 22–32)
Calcium: 8.9 mg/dL (ref 8.9–10.3)
Chloride: 104 mmol/L (ref 98–111)
Creatinine, Ser: 1.06 mg/dL (ref 0.61–1.24)
GFR, Estimated: >60 mL/min (ref >60)
Glucose, Bld: 92 mg/dL (ref 70–99)
Potassium: 3.8 mmol/L (ref 3.5–5.1)
Sodium: 136 mmol/L (ref 135–145)
Total Bilirubin: 1.4 mg/dL — ABNORMAL HIGH (ref 0.0–1.2)
Total Protein: 7.2 g/dL (ref 6.5–8.1)

## 2024-02-15 LAB — GROUP A STREP BY PCR: Group A Strep by PCR: NOT DETECTED

## 2024-02-15 MED ORDER — OSELTAMIVIR PHOSPHATE 75 MG PO CAPS: 75.0000 mg | ORAL_CAPSULE | Freq: Two times a day (BID) | ORAL | 0 refills | Status: DC

## 2024-02-15 MED ORDER — ACETAMINOPHEN 325 MG PO TABS
650.0000 mg | ORAL_TABLET | Freq: Once | ORAL | Status: AC
  Administered 2024-02-15: 650 mg via ORAL
  Filled 2024-02-15: qty 2

## 2024-02-15 MED ORDER — METHOCARBAMOL 500 MG PO TABS
500.0000 mg | ORAL_TABLET | Freq: Once | ORAL | Status: AC
  Administered 2024-02-15: 500 mg via ORAL
  Filled 2024-02-15: qty 1

## 2024-02-15 NOTE — ED Provider Notes (Signed)
 [place] EMERGENCY DEPARTMENT AT [facility] Provider Note   CSN: [id number] Arrival date & time: 02/15/24  6213     History  Chief Complaint  Patient presents with   Headache   Wrist Pain   Back Pain    Tyler James is a 63 y.o. male.  Patient is a 63 year old male who presents to the emergency department with a chief complaint of headache, bilateral wrist pain and lower back pain which has been ongoing since early this morning.  Patient notes that he feels drowsy and so he may have a head cold.  Patient notes that he has had no associate abdominal pain, nausea, vomiting, diarrhea.  He does admit to an associated sore throat.  He denies any associated chest pain or shortness of breath.  He denies any numbness, paresthesias or unilateral weakness.  He denies any associated dizziness, lightheadedness or syncope.  He denies any recent falls or blunt trauma to head, wrist or back.  He denies any urinary bowel incontinence, saddle paresthesias or gait changes.   Headache Associated symptoms: back pain   Wrist Pain Associated symptoms include headaches.  Back Pain Associated symptoms: headaches        Home Medications Prior to Admission medications   Medication Sig Start Date End Date Taking? Authorizing Provider  aspirin EC 81 MG tablet Take 81 mg by mouth daily. Swallow whole.    [provider]  atorvastatin (LIPITOR) 80 MG tablet Take 1 tablet (80 mg total) by mouth daily. 11/28/22   Arty Baumgartner, NP  diphenhydramine-acetaminophen (TYLENOL PM) 25-500 MG TABS tablet Take 1 tablet by mouth at bedtime as needed (Pain).    [provider]  docusate sodium (COLACE) 100 MG capsule Take 1 capsule (100 mg total) by mouth 2 (two) times daily. 04/20/23   Marguerita Merles Latif, DO  losartan (COZAAR) 25 MG tablet Take 25 mg by mouth daily.    [provider]  metoprolol succinate (TOPROL XL) 25 MG 24 hr tablet Take 1 tablet (25 mg total) by mouth  daily. 10/24/22   Sharlene Dory, PA-C  Multiple Vitamin (MULTIVITAMIN WITH MINERALS) TABS tablet Take 1 tablet by mouth daily. 04/21/23   Marguerita Merles Latif, DO  nitroGLYCERIN (NITROSTAT) 0.4 MG SL tablet Place 1 tablet (0.4 mg total) under the tongue every 5 (five) minutes as needed for chest pain. 06/04/23   Antoine Poche, MD  polyethylene glycol (MIRALAX / GLYCOLAX) 17 g packet Take 17 g by mouth daily as needed for mild constipation. 04/20/23   Marguerita Merles Latif, DO  ticagrelor (BRILINTA) 90 MG TABS tablet Take 1 tablet (90 mg total) by mouth 2 (two) times daily. 02/24/23   Antoine Poche, MD      Allergies    Ibuprofen    Review of Systems   Review of Systems  Musculoskeletal:  Positive for back pain.  Neurological:  Positive for headaches.  All other systems reviewed and are negative.   Physical Exam Updated Vital Signs BP 139/77 (BP Location: Left Arm)   Pulse 87   Temp 99.1 F (37.3 C) (Oral)   Resp 16   Ht 5\' 9"  (1.753 m)   Wt 78.5 kg   SpO2 97%   BMI 25.55 kg/m  Physical Exam Vitals and nursing note reviewed.  Constitutional:      Appearance: Normal appearance.  HENT:     Head: Normocephalic and atraumatic.     Nose: Nose normal.  Mouth/Throat:     Mouth: Mucous membranes are moist.  Eyes:     General: No visual field deficit.    Extraocular Movements: Extraocular movements intact.     Conjunctiva/sclera: Conjunctivae normal.     Pupils: Pupils are equal, round, and reactive to light.  Cardiovascular:     Rate and Rhythm: Normal rate and regular rhythm.     Pulses: Normal pulses.     Heart sounds: Normal heart sounds. No murmur heard.    No gallop.  Pulmonary:     Effort: Pulmonary effort is normal. No respiratory distress.     Breath sounds: Normal breath sounds. No stridor. No wheezing, rhonchi or rales.  Abdominal:     General: Abdomen is flat. Bowel sounds are normal. There is no distension.     Palpations: Abdomen is soft.     Tenderness:  There is no abdominal tenderness.  Musculoskeletal:        General: No swelling or tenderness. Normal range of motion.     Cervical back: Normal range of motion and neck supple. No rigidity.  Lymphadenopathy:     Cervical: No cervical adenopathy.  Skin:    General: Skin is warm and dry.     Findings: No rash.  Neurological:     General: No focal deficit present.     Mental Status: He is alert and oriented to person, place, and time. Mental status is at baseline.     Cranial Nerves: No cranial nerve deficit, dysarthria or facial asymmetry.     Sensory: No sensory deficit.     Motor: No weakness.     Coordination: Coordination normal.     Gait: Gait normal.  Psychiatric:        Mood and Affect: Mood normal.        Behavior: Behavior normal.        Thought Content: Thought content normal.        Judgment: Judgment normal.     ED Results / Procedures / Treatments   Labs (all labs ordered are listed, but only abnormal results are displayed) Labs Reviewed  RESP PANEL BY RT-PCR (RSV, FLU A&B, COVID)  [id number]  COMPREHENSIVE METABOLIC PANEL WITH GFR  CBC WITH DIFFERENTIAL/PLATELET  URINALYSIS, ROUTINE W REFLEX MICROSCOPIC  CK    EKG None  Radiology No results found.  Procedures Procedures    Medications Ordered in ED Medications  acetaminophen (TYLENOL) tablet 650 mg (has no administration in time range)  methocarbamol (ROBAXIN) tablet 500 mg (has no administration in time range)    ED Course/ Medical Decision Making/ A&P                                 Medical Decision Making Amount and/or Complexity of Data Reviewed Labs: ordered. Radiology: ordered.  Risk OTC drugs. Prescription drug management.   This patient presents to the ED for concern of headache, back pain, bilateral wrist pain differential diagnosis includes fracture, sprain, strain, intracranial hemorrhage, acute viral syndrome, sepsis, pneumonia, strep pharyngitis    Additional history  obtained:  Additional history obtained from none External records from outside source obtained and reviewed including none   Lab Tests:  I Ordered, and personally interpreted labs.  The pertinent results include: Positive for influenza A, negative CK, mild anemia, no leukocytosis, normal electrolytes, normal kidney function, mild elevation of liver function, normal urinalysis   Imaging Studies ordered:  I ordered imaging studies  including extremity and CT scan of the head I independently visualized and interpreted imaging which showed no acute cardiopulmonary process, no acute intracranial hemorrhage or ischemia I agree with the radiologist interpretation   Medicines ordered and prescription drug management:  I ordered medication including Tylenol, Robaxin for myalgias Reevaluation of the patient after these medicines showed that the patient improved I have reviewed the patients home medicines and have made adjustments as needed   Problem List / ED Course:  Patient is doing well at this time.  Discussed with patient that he is positive for influenza A and do suspect that this is the cause of his symptoms.  Remainder of workup has been unremarkable.  He has no indication for rhabdomyolysis at this point.  He has no indication of urinary tract infection or pneumonia.  He has stable vital signs with no indication for sepsis.  He has no associated hypoxia.  Will place on Tamiflu at this time and recommend close follow-up with primary care doctor.  Strict turn precautions were discussed for any new or worsening symptoms.  Patient voiced understanding and had no additional questions.   Social Determinants of Health:  None           Final Clinical Impression(s) / ED Diagnoses Final diagnoses:  None    Rx / DC Orders ED Discharge Orders     None         Lelon Perla, PA-C 02/15/24 1043    Pricilla Loveless, MD 02/16/24 [phone]

## 2024-02-15 NOTE — ED Notes (Signed)
 Pt gave verbal consent for treatment. No topaz pad on COW

## 2024-02-15 NOTE — ED Triage Notes (Addendum)
 Pt reports he woke up with lower back pain, bilateral wrist pain, and a headache. Pt denies any injurys or activity that may have caused this pain. Pt also states his throat is scratchy

## 2024-02-15 NOTE — ED Notes (Addendum)
 Pt put on monitor due to cardiac history

## 2024-02-15 NOTE — Discharge Instructions (Signed)
 Please follow-up closely with your primary care doctor on an outpatient basis.  Return to emergency department immediately for any new or worsening symptoms.

## 2024-03-07 DIAGNOSIS — M25551 Pain in right hip: Secondary | ICD-10-CM | POA: Diagnosis not present

## 2024-03-07 DIAGNOSIS — I5022 Chronic systolic (congestive) heart failure: Secondary | ICD-10-CM | POA: Diagnosis not present

## 2024-04-06 DIAGNOSIS — I5022 Chronic systolic (congestive) heart failure: Secondary | ICD-10-CM | POA: Diagnosis not present

## 2024-04-06 DIAGNOSIS — M25551 Pain in right hip: Secondary | ICD-10-CM | POA: Diagnosis not present

## 2024-05-07 DIAGNOSIS — M25551 Pain in right hip: Secondary | ICD-10-CM | POA: Diagnosis not present

## 2024-05-07 DIAGNOSIS — I5022 Chronic systolic (congestive) heart failure: Secondary | ICD-10-CM | POA: Diagnosis not present

## 2024-06-02 DIAGNOSIS — Z0001 Encounter for general adult medical examination with abnormal findings: Secondary | ICD-10-CM | POA: Diagnosis not present

## 2024-06-02 DIAGNOSIS — Z7189 Other specified counseling: Secondary | ICD-10-CM | POA: Diagnosis not present

## 2024-06-02 DIAGNOSIS — D649 Anemia, unspecified: Secondary | ICD-10-CM | POA: Diagnosis not present

## 2024-06-02 DIAGNOSIS — I2510 Atherosclerotic heart disease of native coronary artery without angina pectoris: Secondary | ICD-10-CM | POA: Diagnosis not present

## 2024-06-02 DIAGNOSIS — F17200 Nicotine dependence, unspecified, uncomplicated: Secondary | ICD-10-CM | POA: Diagnosis not present

## 2024-06-02 DIAGNOSIS — I255 Ischemic cardiomyopathy: Secondary | ICD-10-CM | POA: Diagnosis not present

## 2024-06-02 DIAGNOSIS — I10 Essential (primary) hypertension: Secondary | ICD-10-CM | POA: Diagnosis not present

## 2024-06-02 DIAGNOSIS — I5022 Chronic systolic (congestive) heart failure: Secondary | ICD-10-CM | POA: Diagnosis not present

## 2024-06-02 DIAGNOSIS — E785 Hyperlipidemia, unspecified: Secondary | ICD-10-CM | POA: Diagnosis not present

## 2024-06-02 DIAGNOSIS — Z1389 Encounter for screening for other disorder: Secondary | ICD-10-CM | POA: Diagnosis not present

## 2024-06-02 DIAGNOSIS — F17210 Nicotine dependence, cigarettes, uncomplicated: Secondary | ICD-10-CM | POA: Diagnosis not present

## 2024-07-03 DIAGNOSIS — I5022 Chronic systolic (congestive) heart failure: Secondary | ICD-10-CM | POA: Diagnosis not present

## 2024-07-03 DIAGNOSIS — M25551 Pain in right hip: Secondary | ICD-10-CM | POA: Diagnosis not present

## 2024-07-04 ENCOUNTER — Ambulatory Visit: Attending: Cardiology | Admitting: Cardiology

## 2024-07-04 ENCOUNTER — Encounter: Payer: Self-pay | Admitting: Cardiology

## 2024-07-04 VITALS — BP 130/74 | HR 70 | Ht 69.0 in | Wt 167.0 lb

## 2024-07-04 DIAGNOSIS — E782 Mixed hyperlipidemia: Secondary | ICD-10-CM

## 2024-07-04 DIAGNOSIS — I2510 Atherosclerotic heart disease of native coronary artery without angina pectoris: Secondary | ICD-10-CM

## 2024-07-04 DIAGNOSIS — I10 Essential (primary) hypertension: Secondary | ICD-10-CM

## 2024-07-04 DIAGNOSIS — Z136 Encounter for screening for cardiovascular disorders: Secondary | ICD-10-CM | POA: Diagnosis not present

## 2024-07-04 MED ORDER — NITROGLYCERIN 0.4 MG SL SUBL: 0.4000 mg | SUBLINGUAL_TABLET | SUBLINGUAL | 3 refills | Status: AC | PRN

## 2024-07-04 NOTE — Patient Instructions (Signed)
 Medication Instructions:   Continue all current medications.   Labwork:  none  Testing/Procedures:  Your physician has requested that you have an abdominal aorta duplex. During this test, an ultrasound is used to evaluate the aorta. Allow 30 minutes for this exam. Do not eat after midnight the day before and avoid carbonated beverages.  Please note: We ask at that you not bring children with you during ultrasound (echo/ vascular) testing. Due to room size and safety concerns, children are not allowed in the ultrasound rooms during exams. Our front office staff cannot provide observation of children in our lobby area while testing is being conducted. An adult accompanying a patient to their appointment will only be allowed in the ultrasound room at the discretion of the ultrasound technician under special circumstances. We apologize for any inconvenience.  Office will contact with results via phone, letter or mychart.     Follow-Up:  6 months   Any Other Special Instructions Will Be Listed Below (If Applicable).   If you need a refill on your cardiac medications before your next appointment, please call your pharmacy.

## 2024-07-04 NOTE — Progress Notes (Signed)
 Clinical Summary Mr. Deisher is a 63 y.o.male seen today for follow up of the following medical problems.    1. CAD - NSTEMI 02/2016, received DES to OM2 and DES to RCA.  - echo 02/2016 LVEF 45-50%  - unclear if possible facial rash on lisinopril  - has been on losartan  and tolerating.      - admit Jan 2024 with STEMI - cardiac catheterization with ostial left circumflex at 75%, proximal left circumflex 95% stenosis treated with PCI/DES x 1, overlapping previous stent placed, known 85% stenosis of OM1 and 55% stenosis of jailed ostial AV groove treated with PTCA.  - It was recommended that Brilinta  would likely be continued beyond a year at reduced dose.  - Jan 2024 echo: LVEF 55%    - no recent chest pains. No SOB/DOE - compliant with meds   2. Hyperlipidemia - higher dose lipitor  caused GI upset. Has tolerated atorvastatin  40mg  daily.  - compliant with statin   Jan 2024 TC 117 TG 54 HDL 53 LDL 53  - 06/2023 TC 95 TG 885 HDL 36 LDL 39   3. HTN Compliant with meds   4. AAA screen - 63 yo male tobacco history, needs screening AAA US .    5. Hip fracture - admit 04/2023 periprohstetic fracture - doing well after surgery   SH: he is on disability Past Medical History:  Diagnosis Date   CAD (coronary artery disease)    a. 02/2016: STEMI - 100% stenosis OM2 w/ DES placed, POBA to LCx, and staged PCI of 95% stenosed RCA w/ DES.  Also in 11/2022   Hypertension    Ischemic cardiomyopathy    a. 02/2016: EF 45-50% w/ diffuse inferolateral hypokinesis     Allergies  Allergen Reactions   Ibuprofen Swelling    Swelling of face     Current Outpatient Medications  Medication Sig Dispense Refill   aspirin  EC 81 MG tablet Take 81 mg by mouth daily. Swallow whole.     atorvastatin  (LIPITOR ) 80 MG tablet Take 1 tablet (80 mg total) by mouth daily. 90 tablet 1   diphenhydramine -acetaminophen  (TYLENOL  PM) 25-500 MG TABS tablet Take 1 tablet by mouth at bedtime as needed (Pain).      docusate sodium  (COLACE) 100 MG capsule Take 1 capsule (100 mg total) by mouth 2 (two) times daily. 10 capsule 0   losartan  (COZAAR ) 25 MG tablet Take 25 mg by mouth daily.     metoprolol  succinate (TOPROL  XL) 25 MG 24 hr tablet Take 1 tablet (25 mg total) by mouth daily. 90 tablet 3   Multiple Vitamin (MULTIVITAMIN WITH MINERALS) TABS tablet Take 1 tablet by mouth daily. 30 tablet 0   nitroGLYCERIN  (NITROSTAT ) 0.4 MG SL tablet Place 1 tablet (0.4 mg total) under the tongue every 5 (five) minutes as needed for chest pain. 25 tablet 3   oseltamivir  (TAMIFLU ) 75 MG capsule Take 1 capsule (75 mg total) by mouth every 12 (twelve) hours. 10 capsule 0   polyethylene glycol (MIRALAX  / GLYCOLAX ) 17 g packet Take 17 g by mouth daily as needed for mild constipation. 14 each 0   ticagrelor  (BRILINTA ) 90 MG TABS tablet Take 1 tablet (90 mg total) by mouth 2 (two) times daily. 180 tablet 2   No current facility-administered medications for this visit.     Past Surgical History:  Procedure Laterality Date   CARDIAC CATHETERIZATION N/A 03/10/2016   Procedure: Left Heart Cath and Coronary Angiography;  Surgeon: Maude  M Swaziland, MD;  Location: [facility] INVASIVE CV LAB;  Service: Cardiovascular;  Laterality: N/A;   CARDIAC CATHETERIZATION N/A 03/10/2016   Procedure: Coronary Stent Intervention;  Surgeon: Peter M Swaziland, MD;  Location: [facility] INVASIVE CV LAB;  Service: Cardiovascular;  Laterality: N/A;   CARDIAC CATHETERIZATION N/A 03/12/2016   Procedure: Coronary Stent Intervention;  Surgeon: Lonni JONETTA Cash, MD;  Location: [facility] INVASIVE CV LAB;  Service: Cardiovascular;  Laterality: N/A;   CONVERSION TO TOTAL HIP Right 04/17/2023   Procedure: CONVERSION HIP RESURFACING TO TOTAL HIP REPLACEMENT;  Surgeon: Edna Toribio LABOR, MD;  Location: [facility] OR;  Service: Orthopedics;  Laterality: Right;   CORONARY/GRAFT ACUTE MI REVASCULARIZATION N/A 11/26/2022   Procedure: Coronary/Graft Acute MI Revascularization;  Surgeon:  Anner Alm ORN, MD;  Location: [facility] INVASIVE CV LAB;  Service: Cardiovascular;  Laterality: N/A;   LEFT HEART CATH AND CORONARY ANGIOGRAPHY N/A 11/26/2022   Procedure: LEFT HEART CATH AND CORONARY ANGIOGRAPHY;  Surgeon: Anner Alm ORN, MD;  Location: [facility] INVASIVE CV LAB;  Service: Cardiovascular;  Laterality: N/A;   TOTAL HIP ARTHROPLASTY     bilateral     Allergies  Allergen Reactions   Ibuprofen Swelling    Swelling of face      No family history on file.   Social History Mr. Bovey reports that he has been smoking cigarettes and cigars. He has never used smokeless tobacco. Mr. Bolar reports current alcohol use.    Physical Examination Today's Vitals   07/04/24 1448  BP: 130/74  Pulse: 70  SpO2: 99%  Weight: 167 lb (75.8 kg)  Height: 5' 9 (1.753 m)   Body mass index is 24.66 kg/m.  Gen: resting comfortably, no acute distress HEENT: no scleral icterus, pupils equal round and reactive, no palptable cervical adenopathy,  CV: RRR, no mrg, no jvd Resp: Clear to auscultation bilaterally GI: abdomen is soft, non-tender, non-distended, normal bowel sounds, no hepatosplenomegaly MSK: extremities are warm, no edema.  Skin: warm, no rash Neuro:  no focal deficits Psych: appropriate affect   Diagnostic Studies  02/2016 Cath Ost 1st Mrg lesion, 90% stenosed. Prox LAD lesion, 30% stenosed. Mid RCA lesion, 95% stenosed. Ost 2nd Mrg to 2nd Mrg lesion, 100% stenosed. Post intervention, there is a 0% residual stenosis. Prox Cx lesion, 50% stenosed. Post intervention, there is a 10% residual stenosis.   1. 2 vessel obstructive CAD. The culprit lesion is occlusion of a large OM2 Jyasia Markoff. There is severe mid RCA stenosis. The first OM is very small. 2. Successful stenting of the second OM with DES. POBA of the distal LCx through the stent struts.   Plan: DAPT for one year. Recommend PCI of the RCA prior to DC. Will assess LV function with Echo.   02/2016 Cath Prox RCA lesion,  99% stenosed. Post intervention, there is a 0% residual stenosis.   1. Severe stenosis mid RCA 2. Successful PTCA/DES x 1 mid RCA   Recommendations: Will continue DAPT with ASA and Brilinta . Continue statin and beta blocker.    02/2016 echo Study Conclusions   - Left ventricle: The cavity size was normal. Wall thickness was   normal. Systolic function was mildly reduced. The estimated   ejection fraction was in the range of 45% to 50%. There is   hypokinesis of the inferolateral myocardium. Left ventricular   diastolic function parameters were normal. - Tricuspid valve: There was mild-moderate regurgitation. - Pulmonary arteries: Systolic pressure was mildly increased. PA   peak pressure: 42 mm Hg (S).  Impressions:   - Hypokinesis of the inferior lateral wall with overall mildly   reduced LV function; redundant MV chordae; trace MR; mild to   moderate TR; mildly elevated pulmonary pressure.   Assessment and Plan   1. CAD - no recent symptoms - interventional had recommended extended DAPT, continue current regimen. At next f/u would lower brillinta to lower dose 60mg  bid.    2. Hyperlipidemia -he is at goal, continue current meds   3. HTN - his bp is at goal, continue current meds  4. AAA screen - 63 yo male history of     Dorn PHEBE Ross, M.D.

## 2024-07-19 ENCOUNTER — Ambulatory Visit: Attending: Cardiology

## 2024-07-19 DIAGNOSIS — Z136 Encounter for screening for cardiovascular disorders: Secondary | ICD-10-CM | POA: Diagnosis not present

## 2024-08-01 ENCOUNTER — Ambulatory Visit: Payer: Self-pay | Admitting: Cardiology

## 2024-08-02 ENCOUNTER — Encounter: Payer: Self-pay | Admitting: *Deleted

## 2024-08-03 DIAGNOSIS — I5022 Chronic systolic (congestive) heart failure: Secondary | ICD-10-CM | POA: Diagnosis not present

## 2024-08-03 DIAGNOSIS — M25551 Pain in right hip: Secondary | ICD-10-CM | POA: Diagnosis not present

## 2024-09-02 DIAGNOSIS — I5022 Chronic systolic (congestive) heart failure: Secondary | ICD-10-CM | POA: Diagnosis not present

## 2024-09-02 DIAGNOSIS — M25551 Pain in right hip: Secondary | ICD-10-CM | POA: Diagnosis not present
